# Patient Record
Sex: Female | Born: 1965 | Race: White | Hispanic: No | Marital: Married | Smoking: Never smoker
Health system: Southern US, Community
[De-identification: ages and names within clinical notes are randomized; demographics above are authoritative.]

## PROBLEM LIST (undated history)

## (undated) DIAGNOSIS — M1990 Unspecified osteoarthritis, unspecified site: Secondary | ICD-10-CM

## (undated) DIAGNOSIS — Z800 Family history of malignant neoplasm of digestive organs: Secondary | ICD-10-CM

## (undated) DIAGNOSIS — E079 Disorder of thyroid, unspecified: Secondary | ICD-10-CM

## (undated) DIAGNOSIS — C801 Malignant (primary) neoplasm, unspecified: Secondary | ICD-10-CM

## (undated) DIAGNOSIS — N816 Rectocele: Secondary | ICD-10-CM

## (undated) DIAGNOSIS — R519 Headache, unspecified: Secondary | ICD-10-CM

## (undated) DIAGNOSIS — B019 Varicella without complication: Secondary | ICD-10-CM

## (undated) DIAGNOSIS — Z85850 Personal history of malignant neoplasm of thyroid: Secondary | ICD-10-CM

## (undated) DIAGNOSIS — N390 Urinary tract infection, site not specified: Secondary | ICD-10-CM

## (undated) DIAGNOSIS — E7800 Pure hypercholesterolemia, unspecified: Secondary | ICD-10-CM

## (undated) DIAGNOSIS — Z808 Family history of malignant neoplasm of other organs or systems: Secondary | ICD-10-CM

## (undated) DIAGNOSIS — Z803 Family history of malignant neoplasm of breast: Secondary | ICD-10-CM

## (undated) DIAGNOSIS — R51 Headache: Secondary | ICD-10-CM

## (undated) HISTORY — DX: Family history of malignant neoplasm of breast: Z80.3

## (undated) HISTORY — DX: Pure hypercholesterolemia, unspecified: E78.00

## (undated) HISTORY — DX: Disorder of thyroid, unspecified: E07.9

## (undated) HISTORY — DX: Headache: R51

## (undated) HISTORY — DX: Headache, unspecified: R51.9

## (undated) HISTORY — DX: Urinary tract infection, site not specified: N39.0

## (undated) HISTORY — DX: Rectocele: N81.6

## (undated) HISTORY — DX: Family history of malignant neoplasm of digestive organs: Z80.0

## (undated) HISTORY — DX: Varicella without complication: B01.9

## (undated) HISTORY — DX: Unspecified osteoarthritis, unspecified site: M19.90

## (undated) HISTORY — PX: THYROIDECTOMY: SHX17

## (undated) HISTORY — DX: Personal history of malignant neoplasm of thyroid: Z85.850

## (undated) HISTORY — DX: Malignant (primary) neoplasm, unspecified: C80.1

## (undated) HISTORY — PX: TONSILECTOMY, ADENOIDECTOMY, BILATERAL MYRINGOTOMY AND TUBES: SHX2538

## (undated) HISTORY — DX: Family history of malignant neoplasm of other organs or systems: Z80.8

---

## 1998-01-01 ENCOUNTER — Other Ambulatory Visit: Admission: RE | Admit: 1998-01-01 | Discharge: 1998-01-01 | Payer: Self-pay | Admitting: *Deleted

## 1999-01-07 ENCOUNTER — Other Ambulatory Visit: Admission: RE | Admit: 1999-01-07 | Discharge: 1999-01-07 | Payer: Self-pay | Admitting: *Deleted

## 2001-07-17 ENCOUNTER — Ambulatory Visit (HOSPITAL_COMMUNITY): Admission: RE | Admit: 2001-07-17 | Discharge: 2001-07-17 | Payer: Self-pay | Admitting: Obstetrics and Gynecology

## 2001-07-17 ENCOUNTER — Encounter: Payer: Self-pay | Admitting: Obstetrics and Gynecology

## 2001-09-24 ENCOUNTER — Ambulatory Visit (HOSPITAL_COMMUNITY): Admission: RE | Admit: 2001-09-24 | Discharge: 2001-09-24 | Payer: Self-pay | Admitting: Obstetrics and Gynecology

## 2001-09-24 ENCOUNTER — Encounter: Payer: Self-pay | Admitting: Obstetrics and Gynecology

## 2001-11-13 ENCOUNTER — Ambulatory Visit (HOSPITAL_COMMUNITY): Admission: RE | Admit: 2001-11-13 | Discharge: 2001-11-13 | Payer: Self-pay | Admitting: Obstetrics and Gynecology

## 2001-11-13 ENCOUNTER — Encounter: Payer: Self-pay | Admitting: Obstetrics and Gynecology

## 2001-12-05 ENCOUNTER — Inpatient Hospital Stay (HOSPITAL_COMMUNITY): Admission: RE | Admit: 2001-12-05 | Discharge: 2001-12-06 | Payer: Self-pay | Admitting: Obstetrics and Gynecology

## 2002-04-12 ENCOUNTER — Ambulatory Visit (HOSPITAL_COMMUNITY): Admission: RE | Admit: 2002-04-12 | Discharge: 2002-04-12 | Payer: Self-pay | Admitting: Pulmonary Disease

## 2003-05-13 ENCOUNTER — Encounter (HOSPITAL_COMMUNITY): Admission: RE | Admit: 2003-05-13 | Discharge: 2003-06-12 | Payer: Self-pay | Admitting: Pulmonary Disease

## 2003-08-14 ENCOUNTER — Ambulatory Visit (HOSPITAL_COMMUNITY): Admission: RE | Admit: 2003-08-14 | Discharge: 2003-08-14 | Payer: Self-pay | Admitting: Pulmonary Disease

## 2003-12-31 ENCOUNTER — Ambulatory Visit (HOSPITAL_COMMUNITY): Admission: RE | Admit: 2003-12-31 | Discharge: 2003-12-31 | Payer: Self-pay | Admitting: Pulmonary Disease

## 2004-12-14 ENCOUNTER — Ambulatory Visit (HOSPITAL_COMMUNITY): Admission: RE | Admit: 2004-12-14 | Discharge: 2004-12-14 | Payer: Self-pay | Admitting: General Surgery

## 2005-10-13 ENCOUNTER — Ambulatory Visit (HOSPITAL_COMMUNITY): Admission: RE | Admit: 2005-10-13 | Discharge: 2005-10-13 | Payer: Self-pay | Admitting: General Surgery

## 2006-02-02 ENCOUNTER — Ambulatory Visit (HOSPITAL_COMMUNITY): Admission: RE | Admit: 2006-02-02 | Discharge: 2006-02-03 | Payer: Self-pay | Admitting: Surgery

## 2006-02-02 ENCOUNTER — Encounter (INDEPENDENT_AMBULATORY_CARE_PROVIDER_SITE_OTHER): Payer: Self-pay | Admitting: Specialist

## 2006-02-21 ENCOUNTER — Encounter (HOSPITAL_COMMUNITY): Admission: RE | Admit: 2006-02-21 | Discharge: 2006-03-23 | Payer: Self-pay | Admitting: Endocrinology

## 2006-02-24 ENCOUNTER — Observation Stay (HOSPITAL_COMMUNITY): Admission: AD | Admit: 2006-02-24 | Discharge: 2006-02-26 | Payer: Self-pay | Admitting: Pulmonary Disease

## 2007-03-19 ENCOUNTER — Encounter (HOSPITAL_COMMUNITY): Admission: RE | Admit: 2007-03-19 | Discharge: 2007-04-18 | Payer: Self-pay | Admitting: Endocrinology

## 2007-12-19 ENCOUNTER — Other Ambulatory Visit: Admission: RE | Admit: 2007-12-19 | Discharge: 2007-12-19 | Payer: Self-pay | Admitting: Obstetrics and Gynecology

## 2008-11-05 ENCOUNTER — Ambulatory Visit (HOSPITAL_COMMUNITY): Admission: RE | Admit: 2008-11-05 | Discharge: 2008-11-05 | Payer: Self-pay | Admitting: Obstetrics and Gynecology

## 2008-12-30 ENCOUNTER — Other Ambulatory Visit: Admission: RE | Admit: 2008-12-30 | Discharge: 2008-12-30 | Payer: Self-pay | Admitting: Obstetrics and Gynecology

## 2009-11-12 ENCOUNTER — Ambulatory Visit (HOSPITAL_COMMUNITY): Admission: RE | Admit: 2009-11-12 | Discharge: 2009-11-12 | Payer: Self-pay | Admitting: Obstetrics and Gynecology

## 2010-04-13 ENCOUNTER — Other Ambulatory Visit: Admission: RE | Admit: 2010-04-13 | Discharge: 2010-04-13 | Payer: Self-pay | Admitting: Obstetrics and Gynecology

## 2010-10-17 ENCOUNTER — Encounter: Payer: Self-pay | Admitting: Endocrinology

## 2010-11-29 ENCOUNTER — Other Ambulatory Visit: Payer: Self-pay | Admitting: Obstetrics and Gynecology

## 2010-11-29 DIAGNOSIS — Z139 Encounter for screening, unspecified: Secondary | ICD-10-CM

## 2010-12-10 ENCOUNTER — Ambulatory Visit (HOSPITAL_COMMUNITY)
Admission: RE | Admit: 2010-12-10 | Discharge: 2010-12-10 | Disposition: A | Discharge status: Home / Self Care | Payer: BC Managed Care – PPO | Source: Ambulatory Visit | Attending: Obstetrics and Gynecology | Admitting: Obstetrics and Gynecology

## 2010-12-10 DIAGNOSIS — Z1231 Encounter for screening mammogram for malignant neoplasm of breast: Secondary | ICD-10-CM | POA: Insufficient documentation

## 2010-12-10 DIAGNOSIS — Z139 Encounter for screening, unspecified: Secondary | ICD-10-CM

## 2011-02-11 NOTE — Op Note (Signed)
[facility]  Patient:    Gwendolyn Flores, Gwendolyn Flores Visit Number: [id number] MRN: [id number]          Service Type: OBS Location: 4A A419 01 Attending Physician:  Lazaro Arms Dictated by:   Christin Bach, M.D. Proc. Date: 12/05/01 Admit Date:  12/05/2001 Discharge Date: 12/06/2001                             Operative Report  DELIVERY NOTE  DELIVERY TIME:  11:30 a.m. on December 05, 2001.  LABOR SUMMARY AND DELIVERY NOTE:  The patient progressed nicely in labor with epidural catheter working effectively.  She had a Foley catheter for voiding difficulty placed at 9 a.m.  She progressed to 9 cm by 9:40.  She had the urge to push, but was considered complete at 10:45 a.m.  Pitocin was started at 11:10 when the contractions effectiveness was considered an obstacle in the patients progress in labor.  She progressed over the next 20 minutes to spontaneous vaginal delivery over an intact perineum of a healthy female infant. Apgars of 8 and 9.  Placenta was delivered easily without difficulty.  Postpartum she did well with normal blood loss, and an epidural catheter was removed, and tip visualized as intact. Dictated by:   Christin Bach, M.D. Attending Physician:  Lazaro Arms DD:  12/30/01 TD:  12/31/01 Job: [id number]

## 2011-02-11 NOTE — Op Note (Signed)
NAMETANICKA, Gwendolyn Flores               ACCOUNT NO.:  [id number]   MEDICAL RECORD NO.:  [id number]           PATIENT TYPE:   LOCATION:                                 FACILITY:   PHYSICIAN:  Velora Heckler, MD           DATE OF BIRTH:   DATE OF PROCEDURE:  02/02/2006  DATE OF DISCHARGE:                                 OPERATIVE REPORT   PREOP DIAGNOSIS:  1.  Right thyroid nodule.  2.  Thyroid goiter.  3.  Hyperthyroidism.   POSTOP DIAGNOSIS:  1.  Right thyroid nodule.  2.  Thyroid goiter.  3.  Hyperthyroidism.   PROCEDURE:  Total thyroidectomy.   SURGEON:  Velora Heckler, MD, FACS   ASSISTANT:  Leonie Man, M.D.   ANESTHESIA:  General per Jenelle Mages. Rica Mast, M.D.   PREPARATION:  Betadine.   BLOOD LOSS:  Minimal.   COMPLICATIONS:  None.   INDICATIONS:  The patient is a 45 year old white female from [place],  [place].  She has been followed by Dr. Casimiro Needle Altheimer.  She was  initially evaluated, in my practice, October 2004.  She has been treated  with Tapazole for thyroid suppression.  Sequential ultrasounds, however,  demonstrate enlargement of the right thyroid nodule.  Previous needle biopsy  showed this to be a follicular lesion without sign of malignancy.  The  patient now comes to surgery for thyroidectomy for thyroid goiter, thyroid  nodule, and hyperthyroidism.   BODY OF REPORT:  Procedure is done in OR #6 at the [facility].  The patient is brought to the operating room, and placed in the  supine position on the operating room table.  Following administration of  general anesthesia the patient is prepped and draped in usual strict aseptic  fashion.  After ascertaining that an adequate level of anesthesia had been  obtained, a Kocher incision is made with a #15 blade.  Dissection is carried  through the skin, subcutaneous tissues, and platysma.  Hemostasis is  obtained with the electrocautery.  Skin flaps were developed cephalad  and  caudad from the thyroid notch to the sternal notch.  A Mahorner self-  retaining retractor is placed for exposure.  Strap muscles were incised in  the midline.  Strap muscles are reflected initially to the left.  Left lobe  on palpation has some subtle nodularity.  Overall it was slightly enlarged.  There are no dominant masses on the left.   We turned our attention to the right lobe.  The right lobe is markedly  enlarged with a dominant central nodule.  The strap muscles are reflected  laterally.  Venous tributaries are divided between small-and-medium  Ligaclips.  Superior pole was dissected out.  Superior pole vessels are  dissected out and ligated in continuity with 2-0 silk ties and medium  Ligaclips and divided.  Gland is rolled medially.  Inferior venous  tributaries are divided between medium Ligaclips; and larger vessels are  ligated in continuity with 2-0 silk ties and divided.  Superior parathyroid  gland is identified.  It is dissected out on its vascular pedicle and  preserved.  Recurrent laryngeal nerve was identified and preserved.  Inferior parathyroid gland on the right is also identified and preserved.  Branches of the inferior thyroid artery are divided between small Ligaclips.  Ligament of Allyson Sabal is transected with the electrocautery; and the gland is  rolled anteriorly up and onto the anterior surface of the trachea.  There is  a moderate size pyramidal lobe which is carefully dissected out using the  electrocautery for hemostasis.   Next, we turned our attention back to the left thyroid lobe.  Again strap  muscles are reflected laterally.  Middle thyroid vein is divided between  small Ligaclips.  Superior pole is dissected out.  Superior pole vessels are  ligated in continuity with 2-0 silk ties and medium ; Ligaclips and divided.  Gland is rolled anteriorly.  Venous tributaries are divided between small-  and-medium Ligaclips.  Inferior venous tributaries are  ligated in continuity  with 2-0 silk ties and divided.  Branches of the inferior thyroid artery are  divided between small Ligaclips.  Superior parathyroid gland is identified  on the left and preserved.  Recurrent nerve was identified and preserved.  Ligament of Allyson Sabal is transected with the electrocautery; and the gland is  rolled up and onto the anterior trachea from which it is excised completely  with the electrocautery.  Sutures used to mark the right superior pole.  The  entire gland is submitted to pathology for review.  Palpation in the neck  shows no other significant adenopathy.  There are no worrisome findings.   Neck is irrigated with warm saline.  Good hemostasis is achieved.  Surgicel  is placed over the area of the recurrent nerves and parathyroid glands  bilaterally.  Strap muscles are reapproximated, in the midline, with  interrupted 3-0 Vicryl sutures.  Platysma is closed with interrupted 3-0  Vicryl sutures.  Skin is closed with a running 4-0 Vicryl subcuticular  suture.  Wound is washed and dried; and Benzoin and Steri-Strips are  applied.  Sterile dressings are applied.  The patient is awakened from  anesthesia and brought to the recovery room in stable condition.      Velora Heckler, MD  Electronically Signed     TMG/MEDQ  D:  02/02/2006  T:  02/03/2006  Job:  [id number]   cc:   Veverly Fells. Altheimer, M.D.  Fax: [phone]   Oneal Deputy. Juanetta Gosling, M.D.  Fax: [phone]   Dalia Heading, M.D.  Fax: [phone]

## 2011-02-11 NOTE — Discharge Summary (Signed)
[facility]  Patient:    Gwendolyn Flores, Gwendolyn Flores Visit Number: [id number] MRN: [id number]          Service Type: OBS Location: 4A A419 01 Attending Physician:  Lazaro Arms Dictated by:   Turner Daniels, M.D. Admit Date:  12/05/2001 Discharge Date: 12/06/2001                             Discharge Summary  NO DICTATION Dictated by:   Turner Daniels, M.D. Attending Physician:  Lazaro Arms DD:  12/06/01 TD:  12/07/01 Job: [id number]

## 2011-02-11 NOTE — Op Note (Signed)
[facility]  Patient:    SARAIH, LORTON Visit Number: [id number] MRN: [id number]          Service Type: OBS Location: 4A A419 01 Attending Physician:  Lazaro Arms Dictated by:   Christin Bach, M.D. Proc. Date: 12/05/01 Admit Date:  12/05/2001 Discharge Date: 12/06/2001                             Operative Report  PROCEDURE:  Epidural catheter.  PROCEDURE NOTE:  The patient was given a fluid bolus and prepped for epidural catheter which was placed at L3-4 interspace using loss-of-resistance technique on the first attempt, with effective analgesia to a T6 level.  Blood pressures remained in the 100 to 110 range with pulses in the 80s to 90s.  The catheter was fed a distance of 3 cm into the epidural space and then taped to the bag after removal of the needle, with a bolus infusion of a 7-cc initial dose of 0.125% Marcaine followed by 12 cc per hour.  The initial test dose was 1.5% lidocaine with epinephrine.  The patient had good analgesic relief of pain. Dictated by:   Christin Bach, M.D. Attending Physician:  Lazaro Arms DD:  12/30/01 TD:  12/31/01 Job: [id number]

## 2011-02-11 NOTE — Group Therapy Note (Signed)
NAME:  Gwendolyn Flores, Gwendolyn Flores               ACCOUNT NO.:  [id number]   MEDICAL RECORD NO.:  [id number]          PATIENT TYPE:  OBV   LOCATION:  A208                          FACILITY:  [facility]   PHYSICIAN:  Edward L. Juanetta Gosling, M.D.DATE OF BIRTH:  1966-04-22   DATE OF PROCEDURE:  DATE OF DISCHARGE:                                   PROGRESS NOTE   HISTORY OF PRESENT ILLNESS:  Gwendolyn Flores has had a radioactive iodine.  Says  she is feeling okay.  She had a little bit of nausea last night.   PHYSICAL EXAMINATION:  VITAL SIGNS:  Exam shows temperature 98.2, pulse 71,  respirations 20, blood pressure 97/71.  Her incision is well-healed.   ASSESSMENT:  She seems to be doing okay.   PLAN:  Plan is for discharge per the protocol.      Edward L. Juanetta Gosling, M.D.  Electronically Signed     ELH/MEDQ  D:  02/25/2006  T:  02/25/2006  Job:  [id number]

## 2011-02-11 NOTE — H&P (Signed)
[facility]  Patient:    Gwendolyn Flores, Gwendolyn Flores Visit Number: [id number] MRN: [id number]          Service Type: OBS Location: 4A A419 01 Attending Physician:  Lazaro Arms Dictated by:   Christin Bach, M.D. Admit Date:  12/05/2001 Discharge Date: 12/06/2001   CC:         Vivia Ewing, D.O.   History and Physical  ADMISSION DIAGNOSES: 1. Pregnancy 37-1/[redacted] weeks gestation. 2. Active phase labor.  HISTORY OF PRESENT ILLNESS:  This 45 year old female, gravida 2, para 1, LMP ______ 68, 2002 placing menstrual EDC March 17 with ultrasound assigned EDC corresponding well is admitted at 39+ weeks gestation after a pregnancy course followed through our office since August 2002 with pregnancy care notable for ultrasounds of the infant showing bilateral hydronephrosis of a mild nature with normal sized bladder, normal renal cortex around a mildly dilated collecting system without evidence of hydroureter.  The patient was then followed with ultrasounds for fluid volume and there has been no evidence of oligohydramnios or progression of the hydronephrosis.  The patient presents on the morning of December 05, 2001 complaining of regular uterine contractions beginning in the early morning hours, approximately 8 a.m. with regular uterine contractions tolerated well; and cervical dilation to 6 cm dilated, 0 station, 90% effaced when initially evaluated.  The patient was admitted with contractions since 3 oclock but leaking fluid beginning just before arrival to the hospital.  PAST MEDICAL HISTORY:  Benign.  PAST SURGICAL HISTORY:  Tonsillectomy.  ALLERGIES:  CODEINE AND SULFA.  MEDICATIONS:  Zyrtec and ______.  PRIOR OBSTETRICAL HISTORY:  Notable for a 13 hour labor with vaginal delivery under epidural analgesia in 2001.  PHYSICAL EXAMINATION:  VITAL SIGNS:  Height:  5 feet 6 inches, weight 168 which is a 30 pound weight gain.  ABDOMEN:  Fetus is a term size,  in vertex presentation with good fetal heart monitoring pattern.  PLAN:  Good prognosis for vaginal delivery. Dictated by:   Christin Bach, M.D. Attending Physician:  Lazaro Arms DD:  12/30/01 TD:  12/31/01 Job: [id number]

## 2011-02-11 NOTE — H&P (Signed)
NAME:  Gwendolyn Flores, Gwendolyn Flores               ACCOUNT NO.:  [id number]   MEDICAL RECORD NO.:  [id number]           PATIENT TYPE:   LOCATION:                                FACILITY:  [facility]   PHYSICIAN:  Edward L. Juanetta Gosling, M.D.DATE OF BIRTH:  [birth year]   DATE OF ADMISSION:  DATE OF DISCHARGE:  LH                                HISTORY & PHYSICAL   REASON FOR ADMISSION:  Radioactive iodine.   HISTORY:  Ms. Boulier is a 45 year old who has had what appeared to be  thyroiditis and a thyroid nodule for some time.  She eventually started  having more problems with it and underwent a total thyroidectomy, Feb 02, 2006, and surprisingly was found to have a thyroid cancer.  She is  undergoing radioactive iodine for that.  In n the past she has been treated  with Tapazole for thyroid suppression.  She has had a needle biopsy that  showed it was a follicular lesion without malignancy.  Her past medical  history otherwise is pretty much negative.  She has had a recent episode of  cystitis and has been treated with Cipro.   SOCIAL HISTORY:  She does not smoke.  She is married, lives at home, works  as a Engineer, civil (consulting).   FAMILY HISTORY:  Positive for a postoperative pulmonary embolus in her  father and diabetes in a brother.   PHYSICAL EXAM:  NECK:  Physical exam shows a previous thyroidectomy scar.  CHEST:  Clear.  HEART:  Regular.  ABDOMEN:  Soft.   ASSESSMENT:  She has thyroid malignancy.   PLAN:  She is going to be admitted for radioactive iodine.      Edward L. Juanetta Gosling, M.D.  Electronically Signed     ELH/MEDQ  D:  02/24/2006  T:  02/24/2006  Job:  [id number]

## 2011-04-19 ENCOUNTER — Other Ambulatory Visit: Payer: Self-pay | Admitting: Adult Health

## 2011-04-19 ENCOUNTER — Other Ambulatory Visit (HOSPITAL_COMMUNITY)
Admission: RE | Admit: 2011-04-19 | Discharge: 2011-04-19 | Disposition: A | Discharge status: Home / Self Care | Payer: BC Managed Care – PPO | Source: Ambulatory Visit | Attending: Obstetrics and Gynecology | Admitting: Obstetrics and Gynecology

## 2011-04-19 DIAGNOSIS — Z01419 Encounter for gynecological examination (general) (routine) without abnormal findings: Secondary | ICD-10-CM | POA: Insufficient documentation

## 2012-01-02 ENCOUNTER — Other Ambulatory Visit: Payer: Self-pay | Admitting: Obstetrics and Gynecology

## 2012-01-02 DIAGNOSIS — Z139 Encounter for screening, unspecified: Secondary | ICD-10-CM

## 2012-01-09 ENCOUNTER — Ambulatory Visit (HOSPITAL_COMMUNITY)
Admission: RE | Admit: 2012-01-09 | Discharge: 2012-01-09 | Disposition: A | Discharge status: Home / Self Care | Payer: BC Managed Care – PPO | Source: Ambulatory Visit | Attending: Obstetrics and Gynecology | Admitting: Obstetrics and Gynecology

## 2012-01-09 DIAGNOSIS — Z1231 Encounter for screening mammogram for malignant neoplasm of breast: Secondary | ICD-10-CM | POA: Insufficient documentation

## 2012-01-09 DIAGNOSIS — R928 Other abnormal and inconclusive findings on diagnostic imaging of breast: Secondary | ICD-10-CM | POA: Insufficient documentation

## 2012-01-09 DIAGNOSIS — Z139 Encounter for screening, unspecified: Secondary | ICD-10-CM

## 2012-01-11 ENCOUNTER — Other Ambulatory Visit: Payer: Self-pay | Admitting: Obstetrics and Gynecology

## 2012-01-11 DIAGNOSIS — R928 Other abnormal and inconclusive findings on diagnostic imaging of breast: Secondary | ICD-10-CM

## 2012-01-13 ENCOUNTER — Ambulatory Visit
Admission: RE | Admit: 2012-01-13 | Discharge: 2012-01-13 | Disposition: A | Discharge status: Home / Self Care | Payer: BC Managed Care – PPO | Source: Ambulatory Visit | Attending: Obstetrics and Gynecology | Admitting: Obstetrics and Gynecology

## 2012-01-13 DIAGNOSIS — R928 Other abnormal and inconclusive findings on diagnostic imaging of breast: Secondary | ICD-10-CM

## 2012-06-01 ENCOUNTER — Other Ambulatory Visit (HOSPITAL_COMMUNITY)
Admission: RE | Admit: 2012-06-01 | Discharge: 2012-06-01 | Disposition: A | Discharge status: Home / Self Care | Payer: BC Managed Care – PPO | Source: Ambulatory Visit | Attending: Obstetrics and Gynecology | Admitting: Obstetrics and Gynecology

## 2012-06-01 DIAGNOSIS — Z01419 Encounter for gynecological examination (general) (routine) without abnormal findings: Secondary | ICD-10-CM | POA: Insufficient documentation

## 2012-06-01 DIAGNOSIS — Z1151 Encounter for screening for human papillomavirus (HPV): Secondary | ICD-10-CM | POA: Insufficient documentation

## 2012-06-04 ENCOUNTER — Other Ambulatory Visit: Payer: Self-pay | Admitting: Obstetrics & Gynecology

## 2013-03-25 ENCOUNTER — Other Ambulatory Visit: Payer: Self-pay | Admitting: Obstetrics and Gynecology

## 2013-03-25 DIAGNOSIS — Z139 Encounter for screening, unspecified: Secondary | ICD-10-CM

## 2013-04-01 ENCOUNTER — Ambulatory Visit (HOSPITAL_COMMUNITY)
Admission: RE | Admit: 2013-04-01 | Discharge: 2013-04-01 | Disposition: A | Discharge status: Home / Self Care | Payer: BC Managed Care – PPO | Source: Ambulatory Visit | Attending: Obstetrics and Gynecology | Admitting: Obstetrics and Gynecology

## 2013-04-01 DIAGNOSIS — Z1231 Encounter for screening mammogram for malignant neoplasm of breast: Secondary | ICD-10-CM | POA: Insufficient documentation

## 2013-04-01 DIAGNOSIS — Z139 Encounter for screening, unspecified: Secondary | ICD-10-CM

## 2013-06-28 ENCOUNTER — Ambulatory Visit (INDEPENDENT_AMBULATORY_CARE_PROVIDER_SITE_OTHER): Payer: BC Managed Care – PPO | Admitting: Adult Health

## 2013-06-28 ENCOUNTER — Encounter: Payer: Self-pay | Admitting: Adult Health

## 2013-06-28 VITALS — BP 108/78 | HR 70 | Ht 65.25 in | Wt 139.0 lb

## 2013-06-28 DIAGNOSIS — Z85850 Personal history of malignant neoplasm of thyroid: Secondary | ICD-10-CM

## 2013-06-28 DIAGNOSIS — Z01419 Encounter for gynecological examination (general) (routine) without abnormal findings: Secondary | ICD-10-CM

## 2013-06-28 DIAGNOSIS — Z1212 Encounter for screening for malignant neoplasm of rectum: Secondary | ICD-10-CM

## 2013-06-28 DIAGNOSIS — N816 Rectocele: Secondary | ICD-10-CM

## 2013-06-28 HISTORY — DX: Rectocele: N81.6

## 2013-06-28 HISTORY — DX: Personal history of malignant neoplasm of thyroid: Z85.850

## 2013-06-28 LAB — HEMOCCULT GUIAC POC 1CARD (OFFICE): Fecal Occult Blood, POC: NEGATIVE

## 2013-06-28 NOTE — Progress Notes (Signed)
Patient ID: Gwendolyn Flores, female   DOB: [birth year], 47 y.o.   MRN: [id number] History of Present Illness: Gwendolyn Flores is a 47 year old white female married in for physical. Had normal pap with negative HPV 2013.  Current Medications, Allergies, Past Medical History, Past Surgical History, Family History and Social History were reviewed in [employer] record.     Review of Systems: Patient denies any headaches, blurred vision, shortness of breath, chest pain, abdominal pain, problems with bowel movements, urination, or intercourse. No joint pain or mood changes.Has had some vertigo saw ENT ?migraine induced vertigo, had seen Dr Juanetta Gosling first,has some IBS-C.    Physical Exam:BP 108/78  Pulse 70  Wt 139 lb (63.05 kg)  LMP 06/24/2013 General:  Well developed, well nourished, no acute distress Skin:  Warm and dry Neck:  Midline trachea, sp thyroidectomy(thyroid cancer) Lungs; Clear to auscultation bilaterally Breast:  No dominant palpable mass, retraction, or nipple discharge,has regular irregularities  Cardiovascular: Regular rate and rhythm Abdomen:  Soft, non tender, no hepatosplenomegaly Pelvic:  External genitalia is normal in appearance.  The vagina is normal in appearance.  The cervix is bulbous.  Uterus is felt to be normal size, shape, and contour.  No adnexal masses or tenderness noted. Rectal: Good sphincter tone, no polyps, or hemorrhoids felt.  Hemoccult negative.Has rectocele. Extremities:  No swelling or varicosities noted Psych:  No mood changes,alert and cooperative, seems happy   Impression: Yearly gyn exam no pap History thyroid cancer Rectocele     Plan: Physical in 1 year Mammogram yearly Colonoscopy at 52 Call prn problems

## 2013-06-28 NOTE — Patient Instructions (Addendum)
Physical in 1 year Mammogram yearly  colonoscopy at 78

## 2013-08-01 ENCOUNTER — Other Ambulatory Visit: Payer: Self-pay

## 2014-03-24 ENCOUNTER — Telehealth: Payer: Self-pay | Admitting: Adult Health

## 2014-03-24 ENCOUNTER — Other Ambulatory Visit: Payer: Self-pay | Admitting: Obstetrics and Gynecology

## 2014-03-24 NOTE — Telephone Encounter (Signed)
Left message x 1. JSY 

## 2014-03-25 NOTE — Telephone Encounter (Signed)
Spoke with pt. Pt didn't need [place] to schedule screening mammo. Pt will schedule her appt, and follow up as needed. [place]

## 2014-04-07 ENCOUNTER — Other Ambulatory Visit: Payer: Self-pay | Admitting: Obstetrics and Gynecology

## 2014-04-07 DIAGNOSIS — Z139 Encounter for screening, unspecified: Secondary | ICD-10-CM

## 2014-04-11 ENCOUNTER — Ambulatory Visit (HOSPITAL_COMMUNITY): Payer: BC Managed Care – PPO

## 2014-04-14 ENCOUNTER — Ambulatory Visit (HOSPITAL_COMMUNITY)
Admission: RE | Admit: 2014-04-14 | Discharge: 2014-04-14 | Disposition: A | Discharge status: Home / Self Care | Payer: BC Managed Care – PPO | Source: Ambulatory Visit | Attending: Obstetrics and Gynecology | Admitting: Obstetrics and Gynecology

## 2014-04-14 DIAGNOSIS — Z139 Encounter for screening, unspecified: Secondary | ICD-10-CM

## 2014-04-14 DIAGNOSIS — Z1231 Encounter for screening mammogram for malignant neoplasm of breast: Secondary | ICD-10-CM | POA: Insufficient documentation

## 2014-04-21 ENCOUNTER — Other Ambulatory Visit: Payer: Self-pay | Admitting: Obstetrics and Gynecology

## 2014-04-21 DIAGNOSIS — R928 Other abnormal and inconclusive findings on diagnostic imaging of breast: Secondary | ICD-10-CM

## 2014-04-25 ENCOUNTER — Ambulatory Visit
Admission: RE | Admit: 2014-04-25 | Discharge: 2014-04-25 | Disposition: A | Discharge status: Home / Self Care | Payer: BC Managed Care – PPO | Source: Ambulatory Visit | Attending: Obstetrics and Gynecology | Admitting: Obstetrics and Gynecology

## 2014-04-25 DIAGNOSIS — R928 Other abnormal and inconclusive findings on diagnostic imaging of breast: Secondary | ICD-10-CM

## 2014-07-28 ENCOUNTER — Encounter: Payer: Self-pay | Admitting: Adult Health

## 2014-10-07 ENCOUNTER — Encounter: Payer: Self-pay | Admitting: *Deleted

## 2014-10-08 ENCOUNTER — Ambulatory Visit (INDEPENDENT_AMBULATORY_CARE_PROVIDER_SITE_OTHER): Payer: BC Managed Care – PPO | Admitting: *Deleted

## 2014-10-08 DIAGNOSIS — I8393 Asymptomatic varicose veins of bilateral lower extremities: Secondary | ICD-10-CM

## 2014-10-08 NOTE — Progress Notes (Signed)
X=.3% Sotradecol administered with a 27g butterfly.  Patient received a total of 6cc.  Cutaneous Laser:pulsed mode  810j/cm2 400 ms delay  13 ms Duration 0.5 spot  Total pulses: 17971Total energy 2.845  Total time::23  Photos: Yes.    Compression stockings applied: Yes.     Treated larger vessels with sclero and small ones with CL. Tol well. Hope for good results. Follow prn. May want more CL in a few months.

## 2014-12-30 ENCOUNTER — Encounter: Payer: Self-pay | Admitting: *Deleted

## 2014-12-31 ENCOUNTER — Ambulatory Visit (INDEPENDENT_AMBULATORY_CARE_PROVIDER_SITE_OTHER): Payer: Self-pay | Admitting: *Deleted

## 2014-12-31 DIAGNOSIS — I8393 Asymptomatic varicose veins of bilateral lower extremities: Secondary | ICD-10-CM

## 2014-12-31 NOTE — Progress Notes (Signed)
   Cutaneous Laser:pulsed mode  810j/cm2 400 ms delay  13 ms Duration 0.5 spot  Total pulses: 2519 Total energy 3.988  Total time:0:32  Photos: No.  Compression stockings applied: No.   Treated hopefully all her tiny red vessels all over her legs. She is very pleased with the sclero from Jan. Tol well. Will follow prn.

## 2015-04-13 ENCOUNTER — Other Ambulatory Visit: Payer: Self-pay | Admitting: Obstetrics and Gynecology

## 2015-04-13 DIAGNOSIS — Z1231 Encounter for screening mammogram for malignant neoplasm of breast: Secondary | ICD-10-CM

## 2015-05-04 ENCOUNTER — Ambulatory Visit (HOSPITAL_COMMUNITY): Payer: Self-pay

## 2015-05-05 ENCOUNTER — Ambulatory Visit (HOSPITAL_COMMUNITY)
Admission: RE | Admit: 2015-05-05 | Discharge: 2015-05-05 | Disposition: A | Discharge status: Home / Self Care | Payer: BLUE CROSS/BLUE SHIELD | Source: Ambulatory Visit | Attending: Pulmonary Disease | Admitting: Pulmonary Disease

## 2015-05-05 ENCOUNTER — Other Ambulatory Visit (HOSPITAL_COMMUNITY): Payer: Self-pay | Admitting: Pulmonary Disease

## 2015-05-05 DIAGNOSIS — R509 Fever, unspecified: Secondary | ICD-10-CM

## 2015-05-05 DIAGNOSIS — R05 Cough: Secondary | ICD-10-CM

## 2015-05-05 DIAGNOSIS — R918 Other nonspecific abnormal finding of lung field: Secondary | ICD-10-CM | POA: Diagnosis not present

## 2015-05-05 DIAGNOSIS — R059 Cough, unspecified: Secondary | ICD-10-CM

## 2015-06-11 ENCOUNTER — Other Ambulatory Visit: Payer: Self-pay | Admitting: Pulmonary Disease

## 2015-06-11 DIAGNOSIS — J189 Pneumonia, unspecified organism: Secondary | ICD-10-CM

## 2015-06-12 ENCOUNTER — Other Ambulatory Visit: Payer: Self-pay | Admitting: Obstetrics and Gynecology

## 2015-06-12 DIAGNOSIS — Z1231 Encounter for screening mammogram for malignant neoplasm of breast: Secondary | ICD-10-CM

## 2015-06-15 ENCOUNTER — Ambulatory Visit (HOSPITAL_COMMUNITY)
Admission: RE | Admit: 2015-06-15 | Discharge: 2015-06-15 | Disposition: A | Discharge status: Home / Self Care | Payer: BLUE CROSS/BLUE SHIELD | Source: Ambulatory Visit

## 2015-06-15 ENCOUNTER — Other Ambulatory Visit: Payer: Self-pay | Admitting: Obstetrics and Gynecology

## 2015-06-15 ENCOUNTER — Ambulatory Visit (HOSPITAL_COMMUNITY)
Admission: RE | Admit: 2015-06-15 | Discharge: 2015-06-15 | Disposition: A | Discharge status: Home / Self Care | Payer: BLUE CROSS/BLUE SHIELD | Source: Ambulatory Visit | Attending: Pulmonary Disease | Admitting: Pulmonary Disease

## 2015-06-15 DIAGNOSIS — J189 Pneumonia, unspecified organism: Secondary | ICD-10-CM | POA: Insufficient documentation

## 2015-06-15 DIAGNOSIS — Z1231 Encounter for screening mammogram for malignant neoplasm of breast: Secondary | ICD-10-CM | POA: Insufficient documentation

## 2015-06-22 ENCOUNTER — Ambulatory Visit (HOSPITAL_COMMUNITY): Payer: BLUE CROSS/BLUE SHIELD

## 2015-09-11 ENCOUNTER — Encounter: Payer: Self-pay | Admitting: *Deleted

## 2015-09-14 ENCOUNTER — Encounter: Payer: Self-pay | Admitting: Neurology

## 2015-09-14 ENCOUNTER — Ambulatory Visit (INDEPENDENT_AMBULATORY_CARE_PROVIDER_SITE_OTHER): Payer: BLUE CROSS/BLUE SHIELD | Admitting: Neurology

## 2015-09-14 VITALS — BP 98/69 | HR 66 | Ht 65.25 in | Wt 139.6 lb

## 2015-09-14 DIAGNOSIS — R42 Dizziness and giddiness: Secondary | ICD-10-CM | POA: Diagnosis not present

## 2015-09-14 DIAGNOSIS — H9192 Unspecified hearing loss, left ear: Secondary | ICD-10-CM

## 2015-09-14 DIAGNOSIS — R51 Headache: Secondary | ICD-10-CM

## 2015-09-14 DIAGNOSIS — G43109 Migraine with aura, not intractable, without status migrainosus: Secondary | ICD-10-CM

## 2015-09-14 DIAGNOSIS — G43809 Other migraine, not intractable, without status migrainosus: Secondary | ICD-10-CM | POA: Insufficient documentation

## 2015-09-14 DIAGNOSIS — R519 Headache, unspecified: Secondary | ICD-10-CM

## 2015-09-14 MED ORDER — TOPIRAMATE ER 25 MG PO CAP24: 25.0000 mg | ORAL_CAPSULE | Freq: Every day | ORAL | Status: DC

## 2015-09-14 MED ORDER — SUMATRIPTAN-NAPROXEN SODIUM 85-500 MG PO TABS: 1.0000 | ORAL_TABLET | Freq: Once | ORAL | Status: DC

## 2015-09-14 NOTE — Patient Instructions (Addendum)
Overall you are doing fairly well but I do want to suggest a few things today:   Remember to drink plenty of fluid, eat healthy meals and do not skip any meals. Try to eat protein with a every meal and eat a healthy snack such as fruit or nuts in between meals. Try to keep a regular sleep-wake schedule and try to exercise daily, particularly in the form of walking, 20-30 minutes a day, if you can.   As far as your medications are concerned, I would like to suggest: none at this time  As far as diagnostic testing: MRi of the brain  As far as your medications are concerned, I would like to suggest: 1. Daily preventative medications a. Riboflavin 400 mg/day (Vitamin B2) (every day) b. Magnesium 400 mg (trigmagnesium dicitrate) daily (every day)  Acute treatment: Treximet at onset of headache. May repeat once in 2 hours.   Will give you Trokendi XR, please hold onto it in case we need to start it  Our phone number is [phone]. We also have an after hours call service for urgent matters and there is a physician on-call for urgent questions. For any emergencies you know to call 911 or go to the nearest emergency room

## 2015-09-14 NOTE — Progress Notes (Signed)
[facility]    Provider:  Dr Jaynee Eagles Referring Provider: Sinda Du, MD Primary Care Physician:  Alonza Bogus, MD  CC:  Headaches  HPI:  Gwendolyn Flores is a 49 y.o. female here as a referral from Dr. Luan Pulling for headaches. 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 Headaches started in 2010 with nausea and dizziness. Lasted for over a day. Didn't have any again until 2013 but now getting more frequent and more severe. The dizziness occurs with headaches. The headaches are behind the eyes and in the temples and someimtes in the top, just pain unsure how to describe it, she gets nausea, no vomiting, no light sensitivity, no sound sensitivity. She takes excedrin migraine. She takes it sporadically some weeks 3-4x others none. At least 2x a week it gets bad enough to take excedrin. Last month had 15 headaches a month, this month only one. Off balance like on a boat. She has had room spinning occ when laying down but more off balance. No other associated symptoms. No inciting events. No focal neurologic deficits.  She provides a headache diary today with details that the dizziness started back in June 2015 but the headaches did not start until August 2016. When the headaches began, the episodes of headache with dizziness increased with the worst month being  Reviewed notes, labs and imaging from outside physicians, which showed;  Reviewed log that patient brought in. Patient is tried Antivert or Valium 2 mg every 6-8 hours for her dizziness. Effexor XR was not effective. First spell was in December 2010 when she was traveling to [facility]. Stopped at [employer] that were set night in the next morning. Had nausea. No vomiting. Lasted over a week. Had a small spell early in 2013. Second big spell June 2013 at the beach. Got worse that day and the next.  Had nausea lasted 3-4 days. The next spell was 08/29/2012 at work. Worse at night in the next morning. She had an upset stomach. Lasted a day.  February 2014: Dizziness for 2 days worse with laying down March 2013: 1 day of dizziness, upon waking April 2014 slight dizziness on laying down April 2015 slight dizziness one day. Better with caffeine. 02/2014 dizziness for 2 days October 2015: 5 days of dizzy spells and nausea. Onset of headaches. June 2016: 1 episode of dizzy spell and nausea July 2016: She woke up with dizziness that lasted 3 weeks. August 2016: 9 days of headache and dizziness September 2016 3 days of headache and dizziness October 2016: 1 episode of dizziness November 2016: 13 headache days with dizziness. December 2016: 1 episode of headache  Reviewed records from [facility]. Stated she is having headache and dizziness with increasing frequency. No change in her hearing although she does have occasional clicking in her left ear.  Review of Systems: Patient complains of symptoms per HPI as well as the following symptoms: Eye pain, hearing changes in left ear, headache, dizziness, anxiety. Pertinent negatives per HPI. All others negative.   Social History   Social History  . Marital Status: Married    Spouse Name: Elta Guadeloupe   . Number of Children: 2  . Years of Education: 16   Occupational History  . RN    Social History Main Topics  . Smoking status: Never Smoker   . Smokeless tobacco: Never Used  . Alcohol Use: No     Comment: wine socially  .  Drug Use: No  . Sexual Activity: Yes    Birth Control/ Protection: Other-see comments     Comment: vasectomy   Other Topics Concern  . Not on file   Social History Narrative   Lives at home with husband and children.   Caffeine use: 1-2 cups per day    Family History  Problem Relation Age of Onset  . Heart disease Father     heart attack  . Diabetes Brother   . Migraines Neg Hx     Past Medical History    Diagnosis Date  . Cancer [facility])     thyroid cancer  . Thyroid disease     cancer  . History of thyroid cancer 06/28/2013  . Rectocele 06/28/2013  . Headache     Past Surgical History  Procedure Laterality Date  . Tonsilectomy, adenoidectomy, bilateral myringotomy and tubes    . Thyroidectomy      Current Outpatient Prescriptions  Medication Sig Dispense Refill  . Ascorbic Acid (VITAMIN C PO) Take by mouth.    . Ascorbic Acid (VITAMIN C) 1000 MG tablet Take 1,000 mg by mouth daily.    Marland Kitchen aspirin-acetaminophen-caffeine (EXCEDRIN MIGRAINE) 250-250-65 MG tablet Take 1 tablet by mouth every 6 (six) hours as needed for headache.    Marland Kitchen BIOTIN PO Take by mouth.    . calcium carbonate 200 MG capsule Take 250 mg by mouth 2 (two) times daily with a meal.    . NASONEX 50 MCG/ACT nasal spray   11  . Probiotic Product (ALIGN PO) Take by mouth.    . SYNTHROID 125 MCG tablet Take 125 mcg by mouth daily before breakfast.     . SUMAtriptan-naproxen (TREXIMET) 85-500 MG tablet Take 1 tablet by mouth once. 10 tablet 0  . Topiramate ER (TROKENDI XR) 25 MG CP24 Take 25 mg by mouth at bedtime. 30 capsule 11   No current facility-administered medications for this visit.    Allergies as of 09/14/2015 - Review Complete 09/14/2015  Allergen Reaction Noted  . Codeine Nausea And Vomiting 06/28/2013  . Sulfa antibiotics Nausea And Vomiting 06/28/2013    Vitals: BP 98/69 mmHg  Pulse 66  Ht 5' 5.25" (1.657 m)  Wt 139 lb 9.6 oz (63.322 kg)  BMI 23.06 kg/m2 Last Weight:  Wt Readings from Last 1 Encounters:  09/14/15 139 lb 9.6 oz (63.322 kg)   Last Height:   Ht Readings from Last 1 Encounters:  09/14/15 5' 5.25" (1.657 m)   Physical exam: Exam: Gen: NAD, conversant, well nourised, well groomed                     CV: RRR, no MRG. No Carotid Bruits. No peripheral edema, warm, nontender Eyes: Conjunctivae clear without exudates or hemorrhage  Neuro: Detailed Neurologic Exam  Speech:    Speech  is normal; fluent and spontaneous with normal comprehension.  Cognition:    The patient is oriented to person, place, and time;     recent and remote memory intact;     language fluent;     normal attention, concentration,     fund of knowledge Cranial Nerves:    The pupils are equal, round, and reactive to light. The fundi are normal and spontaneous venous pulsations are present. Visual fields are full to finger confrontation. Extraocular movements are intact. Trigeminal sensation is intact and the muscles of mastication are normal. The face is symmetric. The palate elevates in the midline. Hearing intact. Voice is normal.  Shoulder shrug is normal. The tongue has normal motion without fasciculations.   Coordination:    Normal finger to nose and heel to shin. Normal rapid alternating movements.   Gait:    Heel-toe and tandem gait are normal.   Motor Observation:    No asymmetry, no atrophy, and no involuntary movements noted. Tone:    Normal muscle tone.    Posture:    Posture is normal. normal erect    Strength:    Strength is V/V in the upper and lower limbs.      Sensation: intact to LT     Reflex Exam:  DTR's:    Deep tendon reflexes in the upper and lower extremities are normal bilaterally.   Toes:    The toes are downgoing bilaterally.   Clonus:    Clonus is absent.       Assessment/Plan:  49 year old female with worsening headaches and dizziness. Need to rule out central cause of vertigo,dizziness such as vestibular schwannoma, cerebellar infarct and for pathologies affecting the brainstem or vestibular nerve, Can consider vestibular migraine however that is a diagnosis of exclusion.   - Discussion with patient regarding migraine triggers and behavioral modifications that can help prevent migraines. Provided information sheet with common migraine triggers including foods to avoid. She would like to try avoiding daily preventative medications at this time. If  headaches worsen or do not improve, can start Trokendi 25 mg at night.  As far as diagnostic testing: MRi of the brain  As far as your medications are concerned, I would like to suggest: 1. Daily preventative medications a. Riboflavin 400 mg/day (Vitamin B2) (every day) b. Magnesium 400 mg (trigmagnesium dicitrate) daily (every day)  Acute treatment: Treximet at onset of headache. May repeat once in 2 hours.     To prevent or relieve headaches, try the following: Cool Compress. Lie down and place a cool compress on your head.  Avoid headache triggers. If certain foods or odors seem to have triggered your migraines in the past, avoid them. A headache diary might help you identify triggers.  Include physical activity in your daily routine. Try a daily walk or other moderate aerobic exercise.  Manage stress. Find healthy ways to cope with the stressors, such as delegating tasks on your to-do list.  Practice relaxation techniques. Try deep breathing, yoga, massage and visualization.  Eat regularly. Eating regularly scheduled meals and maintaining a healthy diet might help prevent headaches. Also, drink plenty of fluids.  Follow a regular sleep schedule. Sleep deprivation might contribute to headaches Consider biofeedback. With this mind-body technique, you learn to control certain bodily functions - such as muscle tension, heart rate and blood pressure - to prevent headaches or reduce headache pain.    Proceed to emergency room if you experience new or worsening symptoms or symptoms do not resolve, if you have new neurologic symptoms or if headache is severe, or for any concerning symptom.     Will request labs from dr Legrand Como altheimer endocrinologist  Sarina Ill, MD  [facility] [address] [place], [place] [address]  Phone [phone] Fax [phone]

## 2015-10-05 ENCOUNTER — Other Ambulatory Visit (HOSPITAL_COMMUNITY)
Admission: RE | Admit: 2015-10-05 | Discharge: 2015-10-05 | Disposition: A | Discharge status: Home / Self Care | Payer: BLUE CROSS/BLUE SHIELD | Source: Ambulatory Visit | Attending: Adult Health | Admitting: Adult Health

## 2015-10-05 ENCOUNTER — Encounter: Payer: Self-pay | Admitting: Adult Health

## 2015-10-05 ENCOUNTER — Ambulatory Visit (INDEPENDENT_AMBULATORY_CARE_PROVIDER_SITE_OTHER): Payer: BLUE CROSS/BLUE SHIELD | Admitting: Adult Health

## 2015-10-05 VITALS — BP 110/80 | HR 78 | Ht 66.0 in | Wt 139.0 lb

## 2015-10-05 DIAGNOSIS — Z01419 Encounter for gynecological examination (general) (routine) without abnormal findings: Secondary | ICD-10-CM | POA: Insufficient documentation

## 2015-10-05 DIAGNOSIS — Z85850 Personal history of malignant neoplasm of thyroid: Secondary | ICD-10-CM

## 2015-10-05 DIAGNOSIS — Z1212 Encounter for screening for malignant neoplasm of rectum: Secondary | ICD-10-CM

## 2015-10-05 DIAGNOSIS — Z1151 Encounter for screening for human papillomavirus (HPV): Secondary | ICD-10-CM | POA: Diagnosis not present

## 2015-10-05 DIAGNOSIS — N816 Rectocele: Secondary | ICD-10-CM

## 2015-10-05 LAB — HEMOCCULT GUIAC POC 1CARD (OFFICE): Fecal Occult Blood, POC: NEGATIVE

## 2015-10-05 NOTE — Patient Instructions (Signed)
Physical in 1 year, pap in 3 years if normal Mammogram yearly Colonoscopy at 5

## 2015-10-05 NOTE — Progress Notes (Signed)
Patient ID: Gwendolyn Flores, female   DOB: 1966-03-05, 50 y.o.   MRN: [id number] History of Present Illness: Gwendolyn Flores is a 50 year old white female, married, G2P2 in for a well woman gyn exam and pap. She got flu shot.  Current Medications, Allergies, Past Medical History, Past Surgical History, Family History and Social History were reviewed in [employer] record.     Review of Systems: Patient denies any daily headaches, hearing loss, fatigue, blurred vision, shortness of breath, chest pain, abdominal pain, problems with bowel movements, urination, or intercourse. No joint pain or mood swings.    Physical Exam:BP 110/80 mmHg  Pulse 78  Ht 5\' 6"  (1.676 m)  Wt 139 lb (63.05 kg)  BMI 22.45 kg/m2  LMP 09/03/2015 General:  Well developed, well nourished, no acute distress Skin:  Warm and dry Neck:  Midline trachea, thyroid surgically absent, good ROM, no lymphadenopathy Lungs; Clear to auscultation bilaterally Breast:  No dominant palpable mass, retraction, or nipple discharge Cardiovascular: Regular rate and rhythm Abdomen:  Soft, non tender, no hepatosplenomegaly Pelvic:  External genitalia is normal in appearance, no lesions.  The vagina is normal in appearance. Urethra has no lesions or masses. The cervix is bulbous.Pap with HPV performed.  Uterus is felt to be normal size, shape, and contour.  No adnexal masses or tenderness noted.Bladder is non tender, no masses felt. Rectal: Good sphincter tone, no polyps, or hemorrhoids felt.  Hemoccult negative.+rectocele Extremities/musculoskeletal:  No swelling or varicosities noted, no clubbing or cyanosis Psych:  No mood changes, alert and cooperative,seems happy   Impression: Well woman gyn exam and pap History of thyroid cancer Rectocele     Plan: Physical in 1 year, pap in 3 if normal Mammogram yearly Labs with Dr Altheimer  Colonoscopy at 50

## 2015-10-06 LAB — CYTOLOGY - PAP

## 2015-10-14 ENCOUNTER — Ambulatory Visit (INDEPENDENT_AMBULATORY_CARE_PROVIDER_SITE_OTHER): Payer: BLUE CROSS/BLUE SHIELD

## 2015-10-14 DIAGNOSIS — G43109 Migraine with aura, not intractable, without status migrainosus: Secondary | ICD-10-CM

## 2015-10-14 DIAGNOSIS — R519 Headache, unspecified: Secondary | ICD-10-CM

## 2015-10-14 DIAGNOSIS — R51 Headache: Secondary | ICD-10-CM

## 2015-10-14 DIAGNOSIS — H9192 Unspecified hearing loss, left ear: Secondary | ICD-10-CM

## 2015-10-14 DIAGNOSIS — R42 Dizziness and giddiness: Secondary | ICD-10-CM | POA: Diagnosis not present

## 2015-10-14 DIAGNOSIS — G43809 Other migraine, not intractable, without status migrainosus: Secondary | ICD-10-CM

## 2015-10-19 ENCOUNTER — Telehealth: Payer: Self-pay | Admitting: *Deleted

## 2015-10-19 NOTE — Telephone Encounter (Signed)
LVM for pt to call back about results. Ok to inform pt MRI brain normal per Dr Jaynee Eagles. Gave [facility] phone number.

## 2015-10-19 NOTE — Telephone Encounter (Signed)
Pt called and was informed that MRI was normal.

## 2015-10-19 NOTE — Telephone Encounter (Signed)
-----   Message from Melvenia Beam, MD sent at 10/16/2015  1:22 PM EST ----- Normal MRi of the brain thanks

## 2015-11-22 ENCOUNTER — Other Ambulatory Visit: Payer: Self-pay | Admitting: Pulmonary Disease

## 2015-11-22 DIAGNOSIS — Z85850 Personal history of malignant neoplasm of thyroid: Secondary | ICD-10-CM

## 2015-12-14 ENCOUNTER — Ambulatory Visit (INDEPENDENT_AMBULATORY_CARE_PROVIDER_SITE_OTHER): Payer: BLUE CROSS/BLUE SHIELD | Admitting: Neurology

## 2015-12-14 ENCOUNTER — Encounter: Payer: Self-pay | Admitting: Neurology

## 2015-12-14 VITALS — BP 117/83 | HR 83 | Ht 66.0 in | Wt 142.8 lb

## 2015-12-14 DIAGNOSIS — G43109 Migraine with aura, not intractable, without status migrainosus: Secondary | ICD-10-CM

## 2015-12-14 DIAGNOSIS — R42 Dizziness and giddiness: Secondary | ICD-10-CM

## 2015-12-14 DIAGNOSIS — G43809 Other migraine, not intractable, without status migrainosus: Secondary | ICD-10-CM

## 2015-12-14 MED ORDER — TOPIRAMATE ER 25 MG PO CAP24: 25.0000 mg | ORAL_CAPSULE | Freq: Every day | ORAL | Status: DC

## 2015-12-14 MED ORDER — SUMATRIPTAN-NAPROXEN SODIUM 85-500 MG PO TABS: 1.0000 | ORAL_TABLET | Freq: Once | ORAL | Status: DC

## 2015-12-14 MED ORDER — ONDANSETRON 4 MG PO TBDP: 4.0000 mg | ORAL_TABLET | Freq: Three times a day (TID) | ORAL | Status: DC | PRN

## 2015-12-14 NOTE — Progress Notes (Signed)
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 She tried treximet and it worked. She took it at 7am. It worked and no side effects. She started the Trokendi on Friday. She started at 25mg . Her neck is better. The positional vertigo is better. Since last being seen she has headaches 2-3x a month. She had one bad one as above and Treximet helped. Since February 20th has had the positional vertigo constantly. Few times had the headache mostly behind the eys and in the temple. Vestibular therapy.     HPI: Gwendolyn Flores is a 50 y.o. female here as a referral from Dr. Luan Pulling for headaches. 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 Headaches started in 2010 with nausea and dizziness. Lasted for over a day. Didn't have any again until 2013 but now getting more frequent and more severe. The dizziness occurs with headaches. The headaches are behind the eyes and in the temples and someimtes in the top, just pain unsure how to describe it, she gets nausea, no vomiting, + light sensitivity, no sound sensitivity. She takes excedrin migraine. She takes it sporadically some weeks 3-4x others none. At least 2x a week it gets bad enough to take excedrin. Last month had 15 headaches a month, this month only one. Off balance like on a boat. She has had room spinning occ when laying down but more off balance. No other associated symptoms. No inciting events. No focal neurologic deficits. She provides a headache diary today with details that the dizziness started back in June 2015 but the headaches did not start until August 2016. When the headaches began, the episodes of headache with dizziness increased with the worst month  being  Reviewed notes, labs and imaging from outside physicians, which showed;  Reviewed log that patient brought in. Patient is tried Antivert or Valium 2 mg every 6-8 hours for her dizziness. Effexor XR was not effective. First spell was in December 2010 when she was traveling to [facility]. Stopped at [employer] that were set night in the next morning. Had nausea. No vomiting. Lasted over a week. Had a small spell early in 2013. Second big spell June 2013 at the beach. Got worse that day and the next. Had nausea lasted 3-4 days. The next spell was 08/29/2012 at work. Worse at night in the next morning. She had an upset stomach. Lasted a day.  February 2014: Dizziness for 2 days worse with laying down March 2013: 1 day of dizziness, upon waking April 2014 slight dizziness on laying down April 2015 slight dizziness one day. Better with caffeine. 02/2014 dizziness for 2 days October 2015: 5 days of dizzy spells and nausea. Onset of headaches. June 2016: 1 episode of dizzy spell and nausea July 2016: She woke up with dizziness that lasted 3 weeks. August 2016: 9 days of headache and dizziness September 2016 3 days of headache and dizziness October 2016: 1 episode of dizziness November 2016: 13 headache days with dizziness. December 2016: 1 episode of headache  Reviewed records from [facility]. Stated she is having headache and dizziness with increasing frequency. No change in her hearing although she does have occasional clicking in her left ear.  Review of Systems: Patient complains of symptoms per HPI as well as the  following symptoms: Eye pain, hearing changes in left ear, headache, dizziness, anxiety. Pertinent negatives per HPI. All others negative.   Social History   Social History  . Marital Status: Married    Spouse Name: Elta Guadeloupe   . Number of Children: 2  . Years of Education: 16   Occupational History  . RN    Social History Main Topics  . Smoking status: Never  Smoker   . Smokeless tobacco: Never Used  . Alcohol Use: Yes     Comment: wine socially  . Drug Use: No  . Sexual Activity: Yes    Birth Control/ Protection: Other-see comments     Comment: vasectomy   Other Topics Concern  . Not on file   Social History Narrative   Lives at home with husband and children.   Caffeine use: 1-2 cups per day    Family History  Problem Relation Age of Onset  . Heart disease Father     heart attack  . Diabetes Brother   . Migraines Neg Hx     Past Medical History  Diagnosis Date  . Cancer [facility])     thyroid cancer  . Thyroid disease     cancer  . History of thyroid cancer 06/28/2013  . Rectocele 06/28/2013  . Headache     Past Surgical History  Procedure Laterality Date  . Tonsilectomy, adenoidectomy, bilateral myringotomy and tubes    . Thyroidectomy      Current Outpatient Prescriptions  Medication Sig Dispense Refill  . Ascorbic Acid (VITAMIN C) 1000 MG tablet Take 1,000 mg by mouth daily.    Marland Kitchen BIOTIN PO Take by mouth 2 (two) times daily.     . Calcium-Magnesium-Vitamin D (CALCIUM MAGNESIUM PO) Take 3 tablets by mouth daily.    Marland Kitchen ibuprofen (ADVIL,MOTRIN) 200 MG tablet Take 200 mg by mouth as needed.    Marland Kitchen NASONEX 50 MCG/ACT nasal spray 1 spray daily.   11  . RIBOFLAVIN PO Take 1 tablet by mouth daily.    Marland Kitchen SYNTHROID 125 MCG tablet Take 125 mcg by mouth daily before breakfast.     . aspirin-acetaminophen-caffeine (EXCEDRIN MIGRAINE) 250-250-65 MG tablet Take 1 tablet by mouth every 6 (six) hours as needed for headache. Reported on 12/14/2015    . Probiotic Product (ALIGN PO) Take by mouth daily. Reported on 12/14/2015     No current facility-administered medications for this visit.    Allergies as of 12/14/2015 - Review Complete 12/14/2015  Allergen Reaction Noted  . Codeine Nausea And Vomiting 06/28/2013  . Levaquin [levofloxacin in d5w] Other (See Comments) 10/05/2015  . Sulfa antibiotics Nausea And Vomiting 06/28/2013     Vitals: BP 117/83 mmHg  Pulse 83  Ht 5\' 6"  (1.676 m)  Wt 142 lb 12.8 oz (64.774 kg)  BMI 23.06 kg/m2 Last Weight:  Wt Readings from Last 1 Encounters:  12/14/15 142 lb 12.8 oz (64.774 kg)   Last Height:   Ht Readings from Last 1 Encounters:  12/14/15 5\' 6"  (1.676 m)    Cognition:  The patient is oriented to person, place, and time;   recent and remote memory intact;   language fluent;   normal attention, concentration,   fund of knowledge Cranial Nerves:  The pupils are equal, round, and reactive to light. The fundi are normal and spontaneous venous pulsations are present. Visual fields are full to finger confrontation. Extraocular movements are intact. Trigeminal sensation is intact and the muscles of mastication are normal. The face is symmetric.  The palate elevates in the midline. Hearing intact. Voice is normal. Shoulder shrug is normal. The tongue has normal motion without fasciculations.   Coordination:  Normal finger to nose and heel to shin. Normal rapid alternating movements.   Gait:  Heel-toe and tandem gait are normal.   Motor Observation:  No asymmetry, no atrophy, and no involuntary movements noted. Tone:  Normal muscle tone.   Posture:  Posture is normal. normal erect   Strength:  Strength is V/V in the upper and lower limbs.    Sensation: intact to LT   Reflex Exam:  DTR's:  Deep tendon reflexes in the upper and lower extremities are normal bilaterally.  Toes:  The toes are downgoing bilaterally.  Clonus:  Clonus is absent.      Assessment/Plan: 50 year old female with worsening migraines and dizziness. Need to rule out central cause of vertigo,dizziness such as vestibular schwannoma, cerebellar infarct and for pathologies affecting the brainstem or vestibular nerve, Can consider vestibular migraine however that is a diagnosis of exclusion.   - Discussion with patient regarding migraine  triggers and behavioral modifications that can help prevent migraines. Provided information sheet with common migraine triggers including foods to avoid. She would like to try avoiding daily preventative medications at this time. If headaches worsen or do not improve, can start Trokendi 25 mg at night.  As far as diagnostic testing: MRi of the brain was normal  Preventative: Trokendi 25mg  at night.  Acute treatment: Treximet at onset of headache. May repeat once in 2 hours.   As far as your medications are concerned, I would like to suggest: Trokendi 25mg  at night Treximet at the onset of headache or dizzziness and vertigo Zofran for dizziness and nausea Vestibular therapy  To prevent or relieve headaches, try the following:  Cool Compress. Lie down and place a cool compress on your head.   Avoid headache triggers. If certain foods or odors seem to have triggered your migraines in the past, avoid them. A headache diary might help you identify triggers.   Include physical activity in your daily routine. Try a daily walk or other moderate aerobic exercise.   Manage stress. Find healthy ways to cope with the stressors, such as delegating tasks on your to-do list.   Practice relaxation techniques. Try deep breathing, yoga, massage and visualization.   Eat regularly. Eating regularly scheduled meals and maintaining a healthy diet might help prevent headaches. Also, drink plenty of fluids.   Follow a regular sleep schedule. Sleep deprivation might contribute to headaches  Consider biofeedback. With this mind-body technique, you learn to control certain bodily functions - such as muscle tension, heart rate and blood pressure - to prevent headaches or reduce headache pain.   Proceed to emergency room if you experience new or worsening symptoms or symptoms do not resolve, if you have new neurologic symptoms or if headache is severe, or for any concerning symptom.   Sarina Ill,  MD  [facility] [address] [place], [place] [address]  Phone [phone] Fax [phone]  A total of 30 minutes was spent face-to-face with this patient. Over half this time was spent on counseling patient on the migraine diagnosis and different diagnostic and therapeutic options available.

## 2015-12-14 NOTE — Patient Instructions (Addendum)
Remember to drink plenty of fluid, eat healthy meals and do not skip any meals. Try to eat protein with a every meal and eat a healthy snack such as fruit or nuts in between meals. Try to keep a regular sleep-wake schedule and try to exercise daily, particularly in the form of walking, 20-30 minutes a day, if you can.   As far as your medications are concerned, I would like to suggest: Trokendi 25mg  at night Treximet at the onset of headache or dizzziness and vertigo Zofran for dizziness and nausea Vestibular therapy  I would like to see you back in 4-6 months, sooner if we need to. Please call us with any interim questions, concerns, problems, updates or refill requests.   Our phone number is [phone]. We also have an after hours call service for urgent matters and there is a physician on-call for urgent questions. For any emergencies you know to call 911 or go to the nearest emergency room  To prevent or relieve headaches, try the following: Cool Compress. Lie down and place a cool compress on your head.  Avoid headache triggers. If certain foods or odors seem to have triggered your migraines in the past, avoid them. A headache diary might help you identify triggers.  Include physical activity in your daily routine. Try a daily walk or other moderate aerobic exercise.  Manage stress. Find healthy ways to cope with the stressors, such as delegating tasks on your to-do list.  Practice relaxation techniques. Try deep breathing, yoga, massage and visualization.  Eat regularly. Eating regularly scheduled meals and maintaining a healthy diet might help prevent headaches. Also, drink plenty of fluids.  Follow a regular sleep schedule. Sleep deprivation might contribute to headaches Consider biofeedback. With this mind-body technique, you learn to control certain bodily functions - such as muscle tension, heart rate and blood pressure - to prevent headaches or reduce headache pain.    Proceed to  emergency room if you experience new or worsening symptoms or symptoms do not resolve, if you have new neurologic symptoms or if headache is severe, or for any concerning symptom.

## 2016-01-25 ENCOUNTER — Telehealth: Payer: Self-pay | Admitting: Adult Health

## 2016-01-25 ENCOUNTER — Telehealth: Payer: Self-pay | Admitting: Neurology

## 2016-01-25 MED FILL — TREXIMET 85-500 MG TABLET: 85-500 | 30 days supply | Qty: 9 | Fill #0

## 2016-01-25 NOTE — Telephone Encounter (Signed)
Thanks emm, please put aside a copay card for her thanks.

## 2016-01-25 NOTE — Telephone Encounter (Signed)
Dr Jaynee Eagles- FYI Called pt back. Relayed Dr Jaynee Eagles message below. Pt verbalized understanding and is in agreement. She has the 2 weeks of samples of the 50mg  that Dr Jaynee Eagles gave her previously.  She stated she will call in a couple weeks and let us know how she is tolerating increase in dosage. She stated the treximet works great but she still had dizziness. Advised I will let Dr Jaynee Eagles know.

## 2016-01-25 NOTE — Telephone Encounter (Signed)
She should increase to 50mg  and then we may need to increase to 100mg  if she does not have side effects. Try the 50mg  for 2-4 weeks and let us know how she is doing. thanks

## 2016-01-25 NOTE — Telephone Encounter (Signed)
Patient is calling in regard to Rx topiramate ER 25 mg and states she does not feel she is benefiting from the medication as she is still having dizziness and headaches.  She said you gave her samples of 50 mg tablets and wonders if she should try the increased dosage or just come off.  Please call.  Thanks!

## 2016-01-25 NOTE — Telephone Encounter (Signed)
Daughter doing well  On OCs, but face still breaking out some, to see dermatologists

## 2016-01-25 NOTE — Telephone Encounter (Signed)
Dr Jaynee Eagles- please advise, thank you!

## 2016-01-26 NOTE — Telephone Encounter (Signed)
I set aside copay card for pt as requested Per Dr Jaynee Eagles, we will wait and see how pt tolerates increase in dose first. If she does well, we will offer copay card.

## 2016-02-08 NOTE — Telephone Encounter (Addendum)
Dr Gwendolyn Flores- Can you place new rx trokendi for 50mg ? Thank you!  Called pt back. Advised we have a copay card she can pick up that would make her monthly copay $0 for the next 12 prescriptions. She will pick up in the office sometime this week. She states the 50mg  is going well. She states she feels she is doing much better. She would like Dr Gwendolyn Flores to send in new rx to [facility]. Advised I will let Dr Gwendolyn Flores know. She verbalized understanding and appreciation.

## 2016-02-08 NOTE — Telephone Encounter (Signed)
Pt called said the 50mg  seems to be doing much better. This is the last week of samples. Pt is taking it 1 x day. Please call RX to [facility].

## 2016-02-09 ENCOUNTER — Other Ambulatory Visit: Payer: Self-pay | Admitting: Neurology

## 2016-02-09 DIAGNOSIS — G43009 Migraine without aura, not intractable, without status migrainosus: Secondary | ICD-10-CM

## 2016-02-09 MED ORDER — TOPIRAMATE ER 50 MG PO CAP24: 50.0000 mg | ORAL_CAPSULE | Freq: Every day | ORAL | Status: DC

## 2016-02-09 NOTE — Telephone Encounter (Signed)
Ordered thanks, make sure she gets the free gold copay card. Thanks !!

## 2016-02-10 ENCOUNTER — Telehealth: Payer: Self-pay | Admitting: *Deleted

## 2016-02-10 DIAGNOSIS — G43009 Migraine without aura, not intractable, without status migrainosus: Secondary | ICD-10-CM

## 2016-02-10 MED ORDER — TOPIRAMATE ER 50 MG PO CAP24: 50.0000 mg | ORAL_CAPSULE | Freq: Every day | ORAL | Status: DC

## 2016-02-10 MED FILL — TROKENDI XR 50 MG CAPSULE: 50 | 30 days supply | Qty: 30 | Fill #0

## 2016-02-10 NOTE — Telephone Encounter (Signed)
Called pt. She stated she used [facility], not [facility]. She apologized. Advised I will change pharmacy on file and [facility] in [facility]. She verbalized understanding.

## 2016-02-10 NOTE — Telephone Encounter (Signed)
Message For: OFFICE               Taken 17-MAY-17 at  3:05PM by [facility] ------------------------------------------------------------ Gwendolyn Flores              CID [id number]  Patient SELF                 Pt's Dr Jaynee Eagles        Area Code 336 Phone# [phone] * DOB [phone]      RE PHARM DIDN'T GET RX=PHARM # [id number]                                                               Disp:Y/N N If Y = C/B If No Response In 44minutes ============================================================

## 2016-02-12 DIAGNOSIS — H52223 Regular astigmatism, bilateral: Secondary | ICD-10-CM | POA: Diagnosis not present

## 2016-02-12 DIAGNOSIS — H43813 Vitreous degeneration, bilateral: Secondary | ICD-10-CM | POA: Diagnosis not present

## 2016-02-12 DIAGNOSIS — H524 Presbyopia: Secondary | ICD-10-CM | POA: Diagnosis not present

## 2016-02-12 DIAGNOSIS — H5203 Hypermetropia, bilateral: Secondary | ICD-10-CM | POA: Diagnosis not present

## 2016-02-29 DIAGNOSIS — E039 Hypothyroidism, unspecified: Secondary | ICD-10-CM | POA: Diagnosis not present

## 2016-02-29 DIAGNOSIS — J029 Acute pharyngitis, unspecified: Secondary | ICD-10-CM | POA: Diagnosis not present

## 2016-02-29 MED FILL — AZITHROMYCIN 250 MG TABLET: 250 | 5 days supply | Qty: 6 | Fill #0

## 2016-03-01 ENCOUNTER — Other Ambulatory Visit (HOSPITAL_COMMUNITY): Payer: Self-pay | Admitting: Pulmonary Disease

## 2016-03-01 DIAGNOSIS — R070 Pain in throat: Secondary | ICD-10-CM

## 2016-03-01 DIAGNOSIS — Z85850 Personal history of malignant neoplasm of thyroid: Secondary | ICD-10-CM

## 2016-03-03 ENCOUNTER — Ambulatory Visit (HOSPITAL_COMMUNITY)
Admission: RE | Admit: 2016-03-03 | Discharge: 2016-03-03 | Disposition: A | Discharge status: Home / Self Care | Payer: 59 | Source: Ambulatory Visit | Attending: Pulmonary Disease | Admitting: Pulmonary Disease

## 2016-03-03 DIAGNOSIS — R070 Pain in throat: Secondary | ICD-10-CM | POA: Diagnosis not present

## 2016-03-03 DIAGNOSIS — M47892 Other spondylosis, cervical region: Secondary | ICD-10-CM | POA: Diagnosis not present

## 2016-03-03 DIAGNOSIS — R221 Localized swelling, mass and lump, neck: Secondary | ICD-10-CM | POA: Diagnosis not present

## 2016-03-03 DIAGNOSIS — Z85850 Personal history of malignant neoplasm of thyroid: Secondary | ICD-10-CM | POA: Insufficient documentation

## 2016-03-03 MED ORDER — IOPAMIDOL (ISOVUE-300) INJECTION 61%
75.0000 mL | Freq: Once | INTRAVENOUS | Status: AC | PRN
  Administered 2016-03-03: 75 mL via INTRAVENOUS

## 2016-03-04 DIAGNOSIS — E890 Postprocedural hypothyroidism: Secondary | ICD-10-CM | POA: Diagnosis not present

## 2016-03-04 DIAGNOSIS — C73 Malignant neoplasm of thyroid gland: Secondary | ICD-10-CM | POA: Diagnosis not present

## 2016-03-11 DIAGNOSIS — C73 Malignant neoplasm of thyroid gland: Secondary | ICD-10-CM | POA: Diagnosis not present

## 2016-03-11 DIAGNOSIS — Z833 Family history of diabetes mellitus: Secondary | ICD-10-CM | POA: Diagnosis not present

## 2016-03-11 DIAGNOSIS — E890 Postprocedural hypothyroidism: Secondary | ICD-10-CM | POA: Diagnosis not present

## 2016-03-11 DIAGNOSIS — E559 Vitamin D deficiency, unspecified: Secondary | ICD-10-CM | POA: Diagnosis not present

## 2016-03-11 MED FILL — TROKENDI XR 50 MG CAPSULE: 50 | 30 days supply | Qty: 30 | Fill #1

## 2016-03-14 MED FILL — SYNTHROID 125 MCG TABLET: 125 | 90 days supply | Qty: 90 | Fill #0

## 2016-04-04 MED FILL — TROKENDI XR 50 MG CAPSULE: 50 | 30 days supply | Qty: 30 | Fill #2

## 2016-05-02 ENCOUNTER — Ambulatory Visit (INDEPENDENT_AMBULATORY_CARE_PROVIDER_SITE_OTHER): Payer: 59 | Admitting: Neurology

## 2016-05-02 ENCOUNTER — Encounter: Payer: Self-pay | Admitting: Neurology

## 2016-05-02 DIAGNOSIS — G43009 Migraine without aura, not intractable, without status migrainosus: Secondary | ICD-10-CM

## 2016-05-02 DIAGNOSIS — R42 Dizziness and giddiness: Secondary | ICD-10-CM

## 2016-05-02 MED ORDER — SUMATRIPTAN-NAPROXEN SODIUM 85-500 MG PO TABS: 1.0000 | ORAL_TABLET | Freq: Once | ORAL | 12 refills | Status: DC

## 2016-05-02 MED ORDER — TOPIRAMATE ER 50 MG PO CAP24: 50.0000 mg | ORAL_CAPSULE | Freq: Every day | ORAL | 11 refills | Status: DC

## 2016-05-02 MED FILL — TROKENDI XR 50 MG CAPSULE: 50 | 30 days supply | Qty: 30 | Fill #0

## 2016-05-02 NOTE — Patient Instructions (Addendum)
Remember to drink plenty of fluid, eat healthy meals and do not skip any meals. Try to eat protein with a every meal and eat a healthy snack such as fruit or nuts in between meals. Try to keep a regular sleep-wake schedule and try to exercise daily, particularly in the form of walking, 20-30 minutes a day, if you can.   As far as your medications are concerned, I would like to suggest: Continue Trokendi and the Treximet  As far as diagnostic testing: None  I would like to see you back in one year, sooner if we need to. Please call us with any interim questions, concerns, problems, updates or refill requests.  Our phone number is [phone]. We also have an after hours call service for urgent matters and there is a physician on-call for urgent questions. For any emergencies you know to call 911 or go to the nearest emergency room

## 2016-05-02 NOTE — Progress Notes (Signed)
[facility]    Provider:  Dr Jaynee Eagles Referring Provider: Sinda Du, MD Primary Care Physician:  Alonza Bogus, MD  CC: Vestibular migraines  Interval History 05/02/2016: No side effects since the medication. She has only had 2 dizzy spells. Occ when she turns to the left in the morning she gets dizzy. She has taken the Treximet twice since going up to 50mg  Trokendi qhs. Her husband is general surgery employed by [employer]. Daughter is 36. She has a daughter who is 61 and a son who is 67. SHe feels great andis very happy. Discussed we will stay at current dose with treximet prn.   MRI of the brain was normal.  Interval History: 50 year old female with vestibular migraines. She tried treximet and it worked. She took it at 7am. It worked and no side effects. She started the Trokendi on Friday. She started at 25mg . Her neck is better. The positional vertigo is better. Since last being seen she has headaches 2-3x a month. She had one bad one as above and Treximet helped. Since February 20th has had the positional vertigo constantly. Few times had the headache mostly behind the eys and in the temple. Vestibular therapy.     HPI: Gwendolyn Flores is a 50 y.o. female here as a referral from Dr. Luan Pulling for headaches. 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 Headaches started in 2010 with nausea and dizziness. Lasted for over a day. Didn't have any again until 2013 but now getting more frequent and more severe. The dizziness occurs with headaches. The headaches are behind the eyes and in the temples and someimtes in the top, just pain unsure how to describe it, she gets nausea, no vomiting, + light sensitivity, no sound sensitivity. She takes excedrin migraine. She takes it sporadically some weeks 3-4x others none. At least 2x a week it gets bad enough to take  excedrin. Last month had 15 headaches a month, this month only one. Off balance like on a boat. She has had room spinning occ when laying down but more off balance. No other associated symptoms. No inciting events. No focal neurologic deficits. She provides a headache diary today with details that the dizziness started back in June 2015 but the headaches did not start until August 2016. When the headaches began, the episodes of headache with dizziness increased with the worst month being  Reviewed notes, labs and imaging from outside physicians, which showed;  Reviewed log that patient brought in. Patient is tried Antivert or Valium 2 mg every 6-8 hours for her dizziness. Effexor XR was not effective. First spell was in December 2010 when she was traveling to [facility]. Stopped at [employer] that were set night in the next morning. Had nausea. No vomiting. Lasted over a week. Had a small spell early in 2013. Second big spell June 2013 at the beach. Got worse that day and the next. Had nausea lasted 3-4 days. The next spell was 08/29/2012 at work. Worse at night in the next morning. She had an upset stomach. Lasted a day.  February 2014: Dizziness for 2 days worse with laying down March 2013: 1 day of dizziness, upon waking April 2014 slight dizziness on laying down April 2015 slight dizziness one day. Better with caffeine. 02/2014 dizziness for 2 days October 2015: 5 days of dizzy spells and nausea. Onset of headaches. June 2016: 1 episode of dizzy spell  and nausea July 2016: She woke up with dizziness that lasted 3 weeks. August 2016: 9 days of headache and dizziness September 2016 3 days of headache and dizziness October 2016: 1 episode of dizziness November 2016: 13 headache days with dizziness. December 2016: 1 episode of headache  Reviewed records from [facility]. Stated she is having headache and dizziness with increasing frequency. No change in her hearing although she  does have occasional clicking in her left ear.  Review of Systems: Patient complains of symptoms per HPI as well as the following symptoms: Eye pain, hearing changes in left ear, headache, dizziness, anxiety. Pertinent negatives per HPI. All others negative.   Social History   Social History  . Marital status: Married    Spouse name: Elta Guadeloupe   . Number of children: 2  . Years of education: 16   Occupational History  . RN    Social History Main Topics  . Smoking status: Never Smoker  . Smokeless tobacco: Never Used  . Alcohol use Yes     Comment: wine socially  . Drug use: No  . Sexual activity: Yes    Birth control/ protection: Other-see comments     Comment: vasectomy   Other Topics Concern  . Not on file   Social History Narrative   Lives at home with husband and children.   Caffeine use: 1-2 cups per day    Family History  Problem Relation Age of Onset  . Heart disease Father     heart attack  . Diabetes Brother   . Migraines Neg Hx     Past Medical History:  Diagnosis Date  . Cancer [facility])    thyroid cancer  . Headache   . History of thyroid cancer 06/28/2013  . Rectocele 06/28/2013  . Thyroid disease    cancer    Past Surgical History:  Procedure Laterality Date  . THYROIDECTOMY    . TONSILECTOMY, ADENOIDECTOMY, BILATERAL MYRINGOTOMY AND TUBES      Current Outpatient Prescriptions  Medication Sig Dispense Refill  . Ascorbic Acid (VITAMIN C) 1000 MG tablet Take 1,000 mg by mouth daily.    Marland Kitchen aspirin-acetaminophen-caffeine (EXCEDRIN MIGRAINE) 250-250-65 MG tablet Take 1 tablet by mouth every 6 (six) hours as needed for headache. Reported on 12/14/2015    . BIOTIN PO Take by mouth 2 (two) times daily.     . Calcium-Magnesium-Vitamin D (CALCIUM MAGNESIUM PO) Take 3 tablets by mouth daily.    Marland Kitchen ibuprofen (ADVIL,MOTRIN) 200 MG tablet Take 200 mg by mouth as needed.    . Probiotic Product (ALIGN PO) Take by mouth daily. Reported on 12/14/2015    . RIBOFLAVIN  PO Take 1 tablet by mouth daily.    . SUMAtriptan-naproxen (TREXIMET) 85-500 MG tablet Take 1 tablet by mouth once. 10 tablet 0  . SYNTHROID 125 MCG tablet Take 125 mcg by mouth daily before breakfast.     . Topiramate ER (TROKENDI XR) 50 MG CP24 Take 50 mg by mouth at bedtime. 30 capsule 11   No current facility-administered medications for this visit.     Allergies as of 05/02/2016 - Review Complete 05/02/2016  Allergen Reaction Noted  . Codeine Nausea And Vomiting 06/28/2013  . Levaquin [levofloxacin in d5w] Other (See Comments) 10/05/2015  . Sulfa antibiotics Nausea And Vomiting 06/28/2013    Vitals: BP 104/71 (BP Location: Right Arm, Patient Position: Sitting, Cuff Size: Normal)   Pulse 92   Ht 5\' 8"  (1.727 m)   Wt 138 lb (62.6 kg)  BMI 20.98 kg/m  Last Weight:  Wt Readings from Last 1 Encounters:  05/02/16 138 lb (62.6 kg)   Last Height:   Ht Readings from Last 1 Encounters:  05/02/16 5\' 8"  (1.727 m)     Cognition:  The patient is oriented to person, place, and time;   recent and remote memory intact;   language fluent;   normal attention, concentration,   fund of knowledge Cranial Nerves:  The pupils are equal, round, and reactive to light. The fundi are normal and spontaneous venous pulsations are present. Visual fields are full to finger confrontation. Extraocular movements are intact. Trigeminal sensation is intact and the muscles of mastication are normal. The face is symmetric. The palate elevates in the midline. Hearing intact. Voice is normal. Shoulder shrug is normal. The tongue has normal motion without fasciculations.   Coordination:  Normal finger to nose and heel to shin. Normal rapid alternating movements.   Gait:  Heel-toe and tandem gait are normal.   Motor Observation:  No asymmetry, no atrophy, and no involuntary movements noted. Tone:  Normal muscle tone.   Posture:  Posture is normal. normal erect    Strength:  Strength is V/V in the upper and lower limbs.    Sensation: intact to LT   Reflex Exam:  DTR's:  Deep tendon reflexes in the upper and lower extremities are normal bilaterally.  Toes:  The toes are downgoing bilaterally.  Clonus:  Clonus is absent.      Assessment/Plan: 50 year old female with worsening migraines and dizziness.   - Discussion with patient regarding migraine triggers and behavioral modifications that can help prevent migraines. Provided information sheet with common migraine triggers including foods to avoid.  As far as diagnostic testing: MRi of the brain was normal  Preventative: Trokendi 50mg  at night.  Acute treatment: Treximet at onset of headache. May repeat once in 2 hours.   As far as your medications are concerned, I would like to suggest: Trokendi 50mg  at night Treximet at the onset of headache or dizzziness and vertigo Zofran for dizziness and nausea Vestibular therapy in the future if needed  To prevent or relieve headaches, try the following:  Cool Compress. Lie down and place a cool compress on your head.   Avoid headache triggers. If certain foods or odors seem to have triggered your migraines in the past, avoid them. A headache diary might help you identify triggers.   Include physical activity in your daily routine. Try a daily walk or other moderate aerobic exercise.   Manage stress. Find healthy ways to cope with the stressors, such as delegating tasks on your to-do list.   Practice relaxation techniques. Try deep breathing, yoga, massage and visualization.   Eat regularly. Eating regularly scheduled meals and maintaining a healthy diet might help prevent headaches. Also, drink plenty of fluids.   Follow a regular sleep schedule. Sleep deprivation might contribute to headaches  Consider biofeedback. With this mind-body technique, you learn to control certain bodily functions - such as  muscle tension, heart rate and blood pressure - to prevent headaches or reduce headache pain.   Proceed to emergency room if you experience new or worsening symptoms or symptoms do not resolve, if you have new neurologic symptoms or if headache is severe, or for any concerning symptom.   Sarina Ill, MD  [facility] [address] [place], [place] [address]  Phone [phone] Fax [phone]  A total of 15 minutes was spent face-to-face with this  patient. Over half this time was spent on counseling patient on the migraibe diagnosis and different diagnostic and therapeutic options available.

## 2016-05-03 DIAGNOSIS — G43009 Migraine without aura, not intractable, without status migrainosus: Secondary | ICD-10-CM | POA: Insufficient documentation

## 2016-06-07 MED FILL — TROKENDI XR 50 MG CAPSULE: 50 | 30 days supply | Qty: 30 | Fill #3

## 2016-07-04 MED FILL — TROKENDI XR 50 MG CAPSULE: 50 | 30 days supply | Qty: 30 | Fill #4

## 2016-07-29 MED FILL — TROKENDI XR 50 MG CAPSULE: 50 | 30 days supply | Qty: 30 | Fill #5

## 2016-08-01 ENCOUNTER — Other Ambulatory Visit: Payer: Self-pay | Admitting: Obstetrics and Gynecology

## 2016-08-01 DIAGNOSIS — Z1231 Encounter for screening mammogram for malignant neoplasm of breast: Secondary | ICD-10-CM

## 2016-08-26 ENCOUNTER — Ambulatory Visit (HOSPITAL_COMMUNITY)
Admission: RE | Admit: 2016-08-26 | Discharge: 2016-08-26 | Disposition: A | Discharge status: Home / Self Care | Payer: 59 | Source: Ambulatory Visit | Attending: Obstetrics and Gynecology | Admitting: Obstetrics and Gynecology

## 2016-08-26 DIAGNOSIS — Z1231 Encounter for screening mammogram for malignant neoplasm of breast: Secondary | ICD-10-CM | POA: Insufficient documentation

## 2016-09-02 MED FILL — TROKENDI XR 50 MG CAPSULE: 50 | 30 days supply | Qty: 30 | Fill #6

## 2016-09-02 MED FILL — SYNTHROID 125 MCG TABLET: 125 | 90 days supply | Qty: 90 | Fill #1

## 2016-09-05 DIAGNOSIS — E890 Postprocedural hypothyroidism: Secondary | ICD-10-CM | POA: Diagnosis not present

## 2016-09-05 DIAGNOSIS — C73 Malignant neoplasm of thyroid gland: Secondary | ICD-10-CM | POA: Diagnosis not present

## 2016-09-09 DIAGNOSIS — E890 Postprocedural hypothyroidism: Secondary | ICD-10-CM | POA: Diagnosis not present

## 2016-09-09 DIAGNOSIS — C73 Malignant neoplasm of thyroid gland: Secondary | ICD-10-CM | POA: Diagnosis not present

## 2016-09-09 DIAGNOSIS — R5383 Other fatigue: Secondary | ICD-10-CM | POA: Diagnosis not present

## 2016-09-09 DIAGNOSIS — J312 Chronic pharyngitis: Secondary | ICD-10-CM | POA: Diagnosis not present

## 2016-09-09 DIAGNOSIS — E559 Vitamin D deficiency, unspecified: Secondary | ICD-10-CM | POA: Diagnosis not present

## 2016-09-09 DIAGNOSIS — E039 Hypothyroidism, unspecified: Secondary | ICD-10-CM | POA: Diagnosis not present

## 2016-09-09 DIAGNOSIS — Z833 Family history of diabetes mellitus: Secondary | ICD-10-CM | POA: Diagnosis not present

## 2016-09-21 ENCOUNTER — Encounter: Payer: Self-pay | Admitting: Neurology

## 2016-10-07 ENCOUNTER — Other Ambulatory Visit: Payer: 59 | Admitting: Adult Health

## 2016-10-21 ENCOUNTER — Encounter: Payer: Self-pay | Admitting: Adult Health

## 2016-10-21 ENCOUNTER — Ambulatory Visit (INDEPENDENT_AMBULATORY_CARE_PROVIDER_SITE_OTHER): Payer: 59 | Admitting: Adult Health

## 2016-10-21 VITALS — BP 100/68 | HR 74 | Ht 65.5 in | Wt 134.5 lb

## 2016-10-21 DIAGNOSIS — Z1211 Encounter for screening for malignant neoplasm of colon: Secondary | ICD-10-CM

## 2016-10-21 DIAGNOSIS — N926 Irregular menstruation, unspecified: Secondary | ICD-10-CM | POA: Diagnosis not present

## 2016-10-21 DIAGNOSIS — Z1212 Encounter for screening for malignant neoplasm of rectum: Secondary | ICD-10-CM

## 2016-10-21 DIAGNOSIS — R61 Generalized hyperhidrosis: Secondary | ICD-10-CM

## 2016-10-21 DIAGNOSIS — N951 Menopausal and female climacteric states: Secondary | ICD-10-CM | POA: Diagnosis not present

## 2016-10-21 DIAGNOSIS — N816 Rectocele: Secondary | ICD-10-CM

## 2016-10-21 DIAGNOSIS — Z01411 Encounter for gynecological examination (general) (routine) with abnormal findings: Secondary | ICD-10-CM | POA: Diagnosis not present

## 2016-10-21 DIAGNOSIS — Z01419 Encounter for gynecological examination (general) (routine) without abnormal findings: Secondary | ICD-10-CM

## 2016-10-21 DIAGNOSIS — Z85850 Personal history of malignant neoplasm of thyroid: Secondary | ICD-10-CM

## 2016-10-21 LAB — HEMOCCULT GUIAC POC 1CARD (OFFICE): Fecal Occult Blood, POC: NEGATIVE

## 2016-10-21 NOTE — Progress Notes (Signed)
Patient ID: Gwendolyn Flores, female   DOB: 20-Dec-1965, 51 y.o.   MRN: [id number] History of Present Illness: Gwendolyn Flores is a 51 year old white female, married in for a well woman gyn exam,she had normal pap with negative HPV 10/05/15.  PCP is Dr Luan Pulling.   Current Medications, Allergies, Past Medical History, Past Surgical History, Family History and Social History were reviewed in [employer] record.     Review of Systems:  Patient denies any headaches, hearing loss, fatigue, blurred vision, shortness of breath, chest pain, abdominal pain, problems with bowel movements, urination, or intercourse. No joint pain or mood swings, +night sweats and irregular periods    Physical Exam:BP 100/68 (BP Location: Left Arm, Patient Position: Sitting, Cuff Size: Normal)   Pulse 74   Ht 5' 5.5" (1.664 m)   Wt 134 lb 8 oz (61 kg)   LMP 08/12/2016   BMI 22.04 kg/m  General:  Well developed, well nourished, no acute distress Skin:  Warm and dry Neck:  Midline trachea, normal thyroid, good ROM, no lymphadenopathy Lungs; Clear to auscultation bilaterally Breast:  No dominant palpable mass, retraction, or nipple discharge Cardiovascular: Regular rate and rhythm Abdomen:  Soft, non tender, no hepatosplenomegaly Pelvic:  External genitalia is normal in appearance, no lesions.  The vagina is normal in appearance. Urethra has no lesions or masses. The cervix is bulbous.  Uterus is felt to be normal size, shape, and contour.  No adnexal masses or tenderness noted.Bladder is non tender, no masses felt. Rectal: Good sphincter tone, no polyps, or hemorrhoids felt.  Hemoccult negative.+rectocele Extremities/musculoskeletal:  No swelling or varicosities noted, no clubbing or cyanosis Psych:  No mood changes, alert and cooperative,seems happy PHQ 2 score 0. Discussed menopause, and treatment options.  Impression:  1. Well woman exam with routine gynecological exam   2. History of thyroid cancer    3. Rectocele   4. Night sweats   5. Irregular periods   6. Perimenopausal   7. Screening for colorectal cancer      Plan: Check [facility] Physical in 1 year Pap in 2020 Mammogram yearly Colonoscopy at 71 Labs with PCP

## 2016-10-22 LAB — FOLLICLE STIMULATING HORMONE: FSH: 40.1 m[IU]/mL

## 2016-12-06 MED FILL — SYNTHROID 125 MCG TABLET: 125 | 90 days supply | Qty: 90 | Fill #2

## 2016-12-09 DIAGNOSIS — E039 Hypothyroidism, unspecified: Secondary | ICD-10-CM | POA: Diagnosis not present

## 2016-12-09 DIAGNOSIS — J312 Chronic pharyngitis: Secondary | ICD-10-CM | POA: Diagnosis not present

## 2016-12-09 DIAGNOSIS — G43909 Migraine, unspecified, not intractable, without status migrainosus: Secondary | ICD-10-CM | POA: Diagnosis not present

## 2016-12-09 DIAGNOSIS — T7840XA Allergy, unspecified, initial encounter: Secondary | ICD-10-CM | POA: Diagnosis not present

## 2017-02-10 DIAGNOSIS — E890 Postprocedural hypothyroidism: Secondary | ICD-10-CM | POA: Diagnosis not present

## 2017-02-10 DIAGNOSIS — C73 Malignant neoplasm of thyroid gland: Secondary | ICD-10-CM | POA: Diagnosis not present

## 2017-02-17 DIAGNOSIS — E890 Postprocedural hypothyroidism: Secondary | ICD-10-CM | POA: Diagnosis not present

## 2017-02-17 DIAGNOSIS — Z833 Family history of diabetes mellitus: Secondary | ICD-10-CM | POA: Diagnosis not present

## 2017-02-17 DIAGNOSIS — E559 Vitamin D deficiency, unspecified: Secondary | ICD-10-CM | POA: Diagnosis not present

## 2017-02-17 DIAGNOSIS — C73 Malignant neoplasm of thyroid gland: Secondary | ICD-10-CM | POA: Diagnosis not present

## 2017-03-06 DIAGNOSIS — K219 Gastro-esophageal reflux disease without esophagitis: Secondary | ICD-10-CM | POA: Diagnosis not present

## 2017-03-24 DIAGNOSIS — H524 Presbyopia: Secondary | ICD-10-CM | POA: Diagnosis not present

## 2017-03-24 DIAGNOSIS — H52223 Regular astigmatism, bilateral: Secondary | ICD-10-CM | POA: Diagnosis not present

## 2017-03-24 DIAGNOSIS — H5203 Hypermetropia, bilateral: Secondary | ICD-10-CM | POA: Diagnosis not present

## 2017-04-04 MED FILL — SYNTHROID 125 MCG TABLET: 125 | 90 days supply | Qty: 75 | Fill #0

## 2017-04-04 MED FILL — HYDROmorphone HCL 2 MG TABS: 2 | 1 days supply | Qty: 1 | Fill #0

## 2017-04-04 MED FILL — TRIAZOLAM 0.25 MG TABLET: 0.25 | 1 days supply | Qty: 1 | Fill #0

## 2017-04-04 MED FILL — PROMETHAZINE 25 MG TABLET: 25 | 1 days supply | Qty: 1 | Fill #0

## 2017-05-08 ENCOUNTER — Ambulatory Visit: Payer: 59 | Admitting: Neurology

## 2017-06-05 ENCOUNTER — Telehealth: Payer: Self-pay | Admitting: Neurology

## 2017-06-05 ENCOUNTER — Ambulatory Visit (INDEPENDENT_AMBULATORY_CARE_PROVIDER_SITE_OTHER): Payer: 59 | Admitting: Neurology

## 2017-06-05 ENCOUNTER — Encounter: Payer: Self-pay | Admitting: Neurology

## 2017-06-05 VITALS — BP 101/68 | HR 77 | Ht 65.0 in | Wt 138.0 lb

## 2017-06-05 DIAGNOSIS — G43009 Migraine without aura, not intractable, without status migrainosus: Secondary | ICD-10-CM

## 2017-06-05 MED ORDER — NAPROXEN 500 MG PO TABS: ORAL_TABLET | ORAL | 12 refills | Status: DC

## 2017-06-05 MED ORDER — SUMATRIPTAN SUCCINATE 100 MG PO TABS: 100.0000 mg | ORAL_TABLET | Freq: Once | ORAL | 12 refills | Status: DC | PRN

## 2017-06-05 MED FILL — SUMATRIPTAN SUCC 100 MG TAB: 100 | 30 days supply | Qty: 18 | Fill #0

## 2017-06-05 MED FILL — NAPROXEN 500 MG TABLET: 500 | 30 days supply | Qty: 60 | Fill #0

## 2017-06-05 NOTE — Telephone Encounter (Signed)
Gwendolyn Flores, Patient requests physical therapy outsid of [facility]. She would like the following (see below). Not sure what order to place for outside PT referral? Would you take care of this, thank you!    Physical Therapy: Art Schulenklopper  [facility] Cervical myofascial pain, forward posture contributing to migraines and cervicalgia. Please evaluate and treat including dry needling, stretching, strengthening, manual therapy/massage, heating, TENS unit, exercising for scapular stabilization, pectoral stretching and rhomboid strengthening as clinically warranted as well as any other modality as recommended by evaluation.

## 2017-06-05 NOTE — Progress Notes (Signed)
[facility]    Provider:  Dr Jaynee Eagles Referring Provider: Sinda Du, MD Primary Care Physician:  Sinda Du, MD  CC: Vestibular migraines  Interval history 06/05/2017: She takes Treximet, still helping. Over the last 3 months she has had a few spells of vertigo when changing positions, she had it for about a week and 2 spells.  Other than that no vertigo. She went through menopause. Her migraines are not that often. She has a nagging headache weekly. Her migraines are behind the eyes and in the temples and someimtes in the top, just pain unsure how to describe it, she gets nausea, no vomiting, + light sensitivity, no sound sensitivity. The weekly headache responds to motrin. She takes treximet when she gets dizzy. Last migraine February but since then she has weekly headaches and episodes of dizziness. The weekly headache   Interval History 05/02/2016: No side effects since the medication. She has only had 2 dizzy spells. Occ when she turns to the left in the morning she gets dizzy. She has taken the Treximet twice since going up to 50mg  Trokendi qhs. Her husband is general surgery employed by [employer]. Daughter is 66. She has a daughter who is 75 and a son who is 48. SHe feels great andis very happy. Discussed we will stay at current dose with treximet prn.   MRI of the brain was normal.  Interval History: 51 year old female with vestibular migraines. She tried treximet and it worked. She took it at 7am. It worked and no side effects. She started the Trokendi on Friday. She started at 25mg . Her neck is better. The positional vertigo is better. Since last being seen she has headaches 2-3x a month. She had one bad one as above and Treximet helped. Since February 20th has had the positional vertigo constantly. Few times had the headache mostly behind the eys and in the temple.Vestibular therapy.    HPI: KAZI MONTORO is a 51 y.o. female here as a referral from Dr.  Luan Pulling for headaches. 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 Headaches started in 2010 with nausea and dizziness. Lasted for over a day. Didn't have any again until 2013 but now getting more frequent and more severe. The dizziness occurs with headaches. The headaches are behind the eyes and in the temples and someimtes in the top, just pain unsure how to describe it, she gets nausea, no vomiting, + light sensitivity, no sound sensitivity. She takes excedrin migraine. She takes it sporadically some weeks 3-4x others none. At least 2x a week it gets bad enough to take excedrin. Last month had 15 headaches a month, this month only one. Off balance like on a boat. She has had room spinning occ when laying down but more off balance. No other associated symptoms. No inciting events. No focal neurologic deficits. She provides a headache diary today with details that the dizziness started back in June 2015 but the headaches did not start until August 2016. When the headaches began, the episodes of headache with dizziness increased with the worst month being  Reviewed notes, labs and imaging from outside physicians, which showed;  Reviewed log that patient brought in. Patient is tried Antivert or Valium 2 mg every 6-8 hours for her dizziness. Effexor XR was not effective. First spell was in December 2010 when she was traveling to [facility]. Stopped at [employer] that were set night in  the next morning. Had nausea. No vomiting. Lasted over a week. Had a small spell early in 2013. Second big spell June 2013 at the beach. Got worse that day and the next. Had nausea lasted 3-4 days. The next spell was 08/29/2012 at work. Worse at night in the next morning. She had an upset stomach. Lasted a day.  February 2014: Dizziness for 2 days worse with laying down March 2013: 1 day of dizziness,  upon waking April 2014 slight dizziness on laying down April 2015 slight dizziness one day. Better with caffeine. 02/2014 dizziness for 2 days October 2015: 5 days of dizzy spells and nausea. Onset of headaches. June 2016: 1 episode of dizzy spell and nausea July 2016: She woke up with dizziness that lasted 3 weeks. August 2016: 9 days of headache and dizziness September 2016 3 days of headache and dizziness October 2016: 1 episode of dizziness November 2016: 13 headache days with dizziness. December 2016: 1 episode of headache  [facility] from [facility]. Stated she is having headache and dizziness with increasing frequency. No change in her hearing although she does have occasional clicking in her left ear.  Review of Systems: Patient complains of symptoms per HPI as well as the following symptoms: Eye pain, hearing changes in left ear, headache, dizziness, anxiety. Pertinent negatives per HPI. All others negative.     Social History   Social History  . Marital status: Married    Spouse name: Elta Guadeloupe   . Number of children: 2  . Years of education: 16   Occupational History  . RN    Social History Main Topics  . Smoking status: Never Smoker  . Smokeless tobacco: Never Used  . Alcohol use Yes     Comment: wine socially  . Drug use: No  . Sexual activity: Yes    Birth control/ protection: Other-see comments     Comment: vasectomy   Other Topics Concern  . Not on file   Social History Narrative   Lives at home with husband and children.   Caffeine use: 1-2 cups per day    Family History  Problem Relation Age of Onset  . Heart disease Father        heart attack  . Diabetes Brother   . Migraines Neg Hx     Past Medical History:  Diagnosis Date  . Cancer [facility])    thyroid cancer  . Headache   . History of thyroid cancer 06/28/2013  . Rectocele 06/28/2013  . Thyroid disease    cancer    Past Surgical History:  Procedure Laterality Date  .  THYROIDECTOMY    . TONSILECTOMY, ADENOIDECTOMY, BILATERAL MYRINGOTOMY AND TUBES      Current Outpatient Prescriptions  Medication Sig Dispense Refill  . Ascorbic Acid (VITAMIN C) 1000 MG tablet Take 1,000 mg by mouth daily.    Marland Kitchen aspirin-acetaminophen-caffeine (EXCEDRIN MIGRAINE) 250-250-65 MG tablet Take 1 tablet by mouth every 6 (six) hours as needed for headache. Reported on 12/14/2015    . Calcium-Magnesium-Vitamin D (CALCIUM MAGNESIUM PO) Take 3 tablets by mouth daily.    . fexofenadine (ALLEGRA) 180 MG tablet Take 180 mg by mouth daily.    Marland Kitchen ibuprofen (ADVIL,MOTRIN) 200 MG tablet Take 200 mg by mouth as needed.    . SUMAtriptan-naproxen (TREXIMET) 85-500 MG tablet Take 1 tablet by mouth once. 10 tablet 12  . SYNTHROID 125 MCG tablet Take 125 mcg by mouth daily before breakfast.      No current facility-administered  medications for this visit.     Allergies as of 06/05/2017 - Review Complete 10/21/2016  Allergen Reaction Noted  . Codeine Nausea And Vomiting 06/28/2013  . Levaquin [levofloxacin in d5w] Other (See Comments) 10/05/2015  . Sulfa antibiotics Nausea And Vomiting 06/28/2013    Vitals: There were no vitals taken for this visit. Last Weight:  Wt Readings from Last 1 Encounters:  10/21/16 134 lb 8 oz (61 kg)   Last Height:   Ht Readings from Last 1 Encounters:  10/21/16 5' 5.5" (1.664 m)     Assessment/Plan: 51 year old female migraines and dizziness.   - No acute medications more than 10x a month and nerve blocks as needed  Physical Therapy: Art Schulenklopper   Physical Therapy Cervical myofascial pain, forward posture contributing to migraines and cervicalgia. Please evaluate and treat including dry needling, stretching, strengthening, manual therapy/massage, heating, TENS unit, exercising for scapular stabilization, pectoral stretching and rhomboid strengthening as clinically warranted as well as any other modality as recommended by evaluation.  -  Discussion with patient regarding migraine triggers and behavioral modifications that can help prevent migraines. Provided information sheet with common migraine triggers including foods to avoid.  As far as diagnostic testing: MRi of the brain was normal  Preventative: Discussed Aimovig Acute treatment: Imitrex and naproxyn at onset of headache. May repeat once in 2 hours.   Sarina Ill, MD  [facility] [address] [place], [place] [address]  Phone [phone] Fax [phone]  A total of 15 minutes was spent face-to-face with this patient. Over half this time was spent on counseling patient on the migraine diagnosis and different diagnostic and therapeutic options available.

## 2017-06-06 NOTE — Telephone Encounter (Signed)
Hinton Dyer, just wanted to make sure you got this below let me know thanks

## 2017-06-07 ENCOUNTER — Other Ambulatory Visit: Payer: Self-pay | Admitting: Neurology

## 2017-06-07 DIAGNOSIS — M7918 Myalgia, other site: Secondary | ICD-10-CM

## 2017-06-07 DIAGNOSIS — M542 Cervicalgia: Secondary | ICD-10-CM

## 2017-06-07 DIAGNOSIS — G43009 Migraine without aura, not intractable, without status migrainosus: Secondary | ICD-10-CM

## 2017-06-07 NOTE — Telephone Encounter (Signed)
Noted Dr. Jaynee Eagles will you put in a referral for her and I will send thanks so Much Hinton Dyer.    [facility]  Website  Directions  Save  [phone] Google reviews   Physical therapist in [place], [place]   Address: [address], [place], [place] [address]  Hours:  Open ? Closes 6:30PM                       Phone: [phone]  Appointments: [email or web address]  Suggest an Engineer, agricultural   Physical Therapy   [email or web address], PT, DPT, CMTPT. Licensed. [facility] .Marland KitchenMarland Kitchen

## 2017-06-07 NOTE — Telephone Encounter (Signed)
Gwendolyn Flores, I placed the order. I hope [facility] doesn't call Gwendolyn Flores as well.  Physical Therapy: Art Schulenklopper  [facility] Cervical myofascial pain, forward posture contributing to migraines and cervicalgia. Please evaluate and treat including dry needling, stretching, strengthening, manual therapy/massage, heating, TENS unit, exercising for scapular stabilization, pectoral stretching and rhomboid strengthening as clinically warranted as well as any other modality as recommended by evaluation.

## 2017-06-08 NOTE — Telephone Encounter (Signed)
No [facility]  want call her I made sure . Referral has  Been sent to her requested place below . Thanks Hinton Dyer

## 2017-06-19 DIAGNOSIS — M6281 Muscle weakness (generalized): Secondary | ICD-10-CM | POA: Diagnosis not present

## 2017-06-19 DIAGNOSIS — R293 Abnormal posture: Secondary | ICD-10-CM | POA: Diagnosis not present

## 2017-06-19 DIAGNOSIS — M542 Cervicalgia: Secondary | ICD-10-CM | POA: Diagnosis not present

## 2017-06-21 DIAGNOSIS — R293 Abnormal posture: Secondary | ICD-10-CM | POA: Diagnosis not present

## 2017-06-21 DIAGNOSIS — M542 Cervicalgia: Secondary | ICD-10-CM | POA: Diagnosis not present

## 2017-06-21 DIAGNOSIS — M6281 Muscle weakness (generalized): Secondary | ICD-10-CM | POA: Diagnosis not present

## 2017-06-26 DIAGNOSIS — M6281 Muscle weakness (generalized): Secondary | ICD-10-CM | POA: Diagnosis not present

## 2017-06-26 DIAGNOSIS — R293 Abnormal posture: Secondary | ICD-10-CM | POA: Diagnosis not present

## 2017-06-26 DIAGNOSIS — M542 Cervicalgia: Secondary | ICD-10-CM | POA: Diagnosis not present

## 2017-07-03 MED FILL — SYNTHROID 125 MCG TABLET: 125 | 90 days supply | Qty: 75 | Fill #1

## 2017-07-04 DIAGNOSIS — M6281 Muscle weakness (generalized): Secondary | ICD-10-CM | POA: Diagnosis not present

## 2017-07-04 DIAGNOSIS — M542 Cervicalgia: Secondary | ICD-10-CM | POA: Diagnosis not present

## 2017-07-04 DIAGNOSIS — R293 Abnormal posture: Secondary | ICD-10-CM | POA: Diagnosis not present

## 2017-07-14 DIAGNOSIS — M6281 Muscle weakness (generalized): Secondary | ICD-10-CM | POA: Diagnosis not present

## 2017-07-14 DIAGNOSIS — M542 Cervicalgia: Secondary | ICD-10-CM | POA: Diagnosis not present

## 2017-07-14 DIAGNOSIS — R293 Abnormal posture: Secondary | ICD-10-CM | POA: Diagnosis not present

## 2017-08-04 DIAGNOSIS — C73 Malignant neoplasm of thyroid gland: Secondary | ICD-10-CM | POA: Diagnosis not present

## 2017-08-04 DIAGNOSIS — Z833 Family history of diabetes mellitus: Secondary | ICD-10-CM | POA: Diagnosis not present

## 2017-08-04 DIAGNOSIS — E890 Postprocedural hypothyroidism: Secondary | ICD-10-CM | POA: Diagnosis not present

## 2017-08-11 DIAGNOSIS — Z85850 Personal history of malignant neoplasm of thyroid: Secondary | ICD-10-CM | POA: Diagnosis not present

## 2017-08-11 DIAGNOSIS — Z23 Encounter for immunization: Secondary | ICD-10-CM | POA: Diagnosis not present

## 2017-08-11 DIAGNOSIS — R7301 Impaired fasting glucose: Secondary | ICD-10-CM | POA: Diagnosis not present

## 2017-08-11 DIAGNOSIS — E559 Vitamin D deficiency, unspecified: Secondary | ICD-10-CM | POA: Diagnosis not present

## 2017-08-11 DIAGNOSIS — E890 Postprocedural hypothyroidism: Secondary | ICD-10-CM | POA: Diagnosis not present

## 2017-08-11 DIAGNOSIS — Z833 Family history of diabetes mellitus: Secondary | ICD-10-CM | POA: Diagnosis not present

## 2017-09-25 ENCOUNTER — Other Ambulatory Visit: Payer: Self-pay | Admitting: Obstetrics and Gynecology

## 2017-09-25 DIAGNOSIS — Z1231 Encounter for screening mammogram for malignant neoplasm of breast: Secondary | ICD-10-CM

## 2017-09-25 MED FILL — SYNTHROID 125 MCG TABLET: 125 | 90 days supply | Qty: 75 | Fill #2

## 2017-10-27 DIAGNOSIS — M25551 Pain in right hip: Secondary | ICD-10-CM | POA: Diagnosis not present

## 2017-10-30 ENCOUNTER — Ambulatory Visit (HOSPITAL_COMMUNITY)
Admission: RE | Admit: 2017-10-30 | Discharge: 2017-10-30 | Disposition: A | Discharge status: Home / Self Care | Payer: 59 | Source: Ambulatory Visit | Attending: Obstetrics and Gynecology | Admitting: Obstetrics and Gynecology

## 2017-10-30 DIAGNOSIS — Z1231 Encounter for screening mammogram for malignant neoplasm of breast: Secondary | ICD-10-CM | POA: Insufficient documentation

## 2017-11-13 MED FILL — SUMATRIPTAN SUCC 100 MG TAB: 100 | 30 days supply | Qty: 8 | Fill #1

## 2017-12-08 DIAGNOSIS — J301 Allergic rhinitis due to pollen: Secondary | ICD-10-CM | POA: Diagnosis not present

## 2017-12-08 DIAGNOSIS — G43909 Migraine, unspecified, not intractable, without status migrainosus: Secondary | ICD-10-CM | POA: Diagnosis not present

## 2017-12-08 DIAGNOSIS — Z85850 Personal history of malignant neoplasm of thyroid: Secondary | ICD-10-CM | POA: Diagnosis not present

## 2017-12-08 DIAGNOSIS — K210 Gastro-esophageal reflux disease with esophagitis: Secondary | ICD-10-CM | POA: Diagnosis not present

## 2017-12-18 MED FILL — SYNTHROID 125 MCG TABLET: 125 | 90 days supply | Qty: 75 | Fill #3

## 2018-02-09 DIAGNOSIS — C73 Malignant neoplasm of thyroid gland: Secondary | ICD-10-CM | POA: Diagnosis not present

## 2018-02-09 DIAGNOSIS — E890 Postprocedural hypothyroidism: Secondary | ICD-10-CM | POA: Diagnosis not present

## 2018-02-22 DIAGNOSIS — C73 Malignant neoplasm of thyroid gland: Secondary | ICD-10-CM | POA: Diagnosis not present

## 2018-02-22 DIAGNOSIS — Z833 Family history of diabetes mellitus: Secondary | ICD-10-CM | POA: Diagnosis not present

## 2018-02-22 DIAGNOSIS — E559 Vitamin D deficiency, unspecified: Secondary | ICD-10-CM | POA: Diagnosis not present

## 2018-02-22 DIAGNOSIS — E890 Postprocedural hypothyroidism: Secondary | ICD-10-CM | POA: Diagnosis not present

## 2018-03-19 DIAGNOSIS — H43813 Vitreous degeneration, bilateral: Secondary | ICD-10-CM | POA: Diagnosis not present

## 2018-03-19 DIAGNOSIS — H524 Presbyopia: Secondary | ICD-10-CM | POA: Diagnosis not present

## 2018-03-19 DIAGNOSIS — H52223 Regular astigmatism, bilateral: Secondary | ICD-10-CM | POA: Diagnosis not present

## 2018-03-19 DIAGNOSIS — H5203 Hypermetropia, bilateral: Secondary | ICD-10-CM | POA: Diagnosis not present

## 2018-03-19 MED FILL — SUMATRIPTAN SUCC 100 MG TAB: 100 | 30 days supply | Qty: 8 | Fill #2

## 2018-03-19 MED FILL — SYNTHROID 125 MCG TABLET: 125 | 90 days supply | Qty: 75 | Fill #0

## 2018-04-23 ENCOUNTER — Encounter: Payer: Self-pay | Admitting: Adult Health

## 2018-04-23 ENCOUNTER — Ambulatory Visit (INDEPENDENT_AMBULATORY_CARE_PROVIDER_SITE_OTHER): Payer: 59 | Admitting: Adult Health

## 2018-04-23 VITALS — BP 85/57 | HR 79 | Ht 65.0 in | Wt 140.0 lb

## 2018-04-23 DIAGNOSIS — N816 Rectocele: Secondary | ICD-10-CM

## 2018-04-23 DIAGNOSIS — Z01419 Encounter for gynecological examination (general) (routine) without abnormal findings: Secondary | ICD-10-CM | POA: Diagnosis not present

## 2018-04-23 DIAGNOSIS — N951 Menopausal and female climacteric states: Secondary | ICD-10-CM

## 2018-04-23 DIAGNOSIS — N926 Irregular menstruation, unspecified: Secondary | ICD-10-CM | POA: Insufficient documentation

## 2018-04-23 DIAGNOSIS — Z1211 Encounter for screening for malignant neoplasm of colon: Secondary | ICD-10-CM

## 2018-04-23 DIAGNOSIS — Z1212 Encounter for screening for malignant neoplasm of rectum: Secondary | ICD-10-CM

## 2018-04-23 DIAGNOSIS — Z85850 Personal history of malignant neoplasm of thyroid: Secondary | ICD-10-CM

## 2018-04-23 LAB — HEMOCCULT GUIAC POC 1CARD (OFFICE): Fecal Occult Blood, POC: NEGATIVE

## 2018-04-23 NOTE — Progress Notes (Signed)
Patient ID: Gwendolyn Flores, female   DOB: 1966/08/22, 52 y.o.   MRN: [id number] History of Present Illness: Gwendolyn Flores is a 52 year old white female,marriedm G2P2, 52 y.o. in for a well woman gyn exam,she had normal pap with negative HPV 10/05/15. PCP is Dr Luan Pulling.    Current Medications, Allergies, Past Medical History, Past Surgical History, Family History and Social History were reviewed in [employer] record.     Review of Systems: Patient denies any hearing loss, fatigue, blurred vision, shortness of breath, chest pain, abdominal pain, problems with bowel movements, urination, or intercourse. No joint pain or mood swings. Has migraines at times, period irregular.   Physical Exam:BP (!) 85/57 (BP Location: Left Arm, Patient Position: Sitting, Cuff Size: Normal)   Pulse 79   Ht 5\' 5"  (1.651 m)   Wt 140 lb (63.5 kg)   BMI 23.30 kg/m June 22.2019 was last period and before that it was September 2018.  General:  Well developed, well nourished, no acute distress Skin:  Warm and dry Neck:  Midline trachea,thyroid absent, good ROM, no lymphadenopathy Lungs; Clear to auscultation bilaterally Breast:  No dominant palpable mass, retraction, or nipple discharge Cardiovascular: Regular rate and rhythm Abdomen:  Soft, non tender, no hepatosplenomegaly Pelvic:  External genitalia is normal in appearance, no lesions.  The vagina is normal in appearance. Urethra has no lesions or masses. The cervix is bulbous.  Uterus is felt to be normal size, shape, and contour.  No adnexal masses or tenderness noted.Bladder is non tender, no masses felt. Rectal: Good sphincter tone, no polyps, or hemorrhoids felt.  Hemoccult negative.+rectocele Extremities/musculoskeletal:  No swelling or varicosities noted, no clubbing or cyanosis Psych:  No mood changes, alert and cooperative,seems happy PHQ 2 score 0.  Impression:  1. Well woman exam with routine gynecological exam   2. Screening for colorectal  cancer   3. History of thyroid cancer   4. Irregular periods   5. Perimenopausal   6. Rectocele      Plan: Labs with Dr Altheimer Physical and pap in 1 year Mammogram yearly Colonoscopy at 62 Keep period log

## 2018-06-01 MED FILL — SUMATRIPTAN SUCC 100 MG TAB: 100 | 30 days supply | Qty: 8 | Fill #3

## 2018-06-11 MED FILL — SYNTHROID 125 MCG TABLET: 125 | 90 days supply | Qty: 75 | Fill #1

## 2018-07-09 DIAGNOSIS — L821 Other seborrheic keratosis: Secondary | ICD-10-CM | POA: Diagnosis not present

## 2018-07-09 DIAGNOSIS — D225 Melanocytic nevi of trunk: Secondary | ICD-10-CM | POA: Diagnosis not present

## 2018-07-09 DIAGNOSIS — Z08 Encounter for follow-up examination after completed treatment for malignant neoplasm: Secondary | ICD-10-CM | POA: Diagnosis not present

## 2018-07-09 DIAGNOSIS — L738 Other specified follicular disorders: Secondary | ICD-10-CM | POA: Diagnosis not present

## 2018-07-09 DIAGNOSIS — Z85828 Personal history of other malignant neoplasm of skin: Secondary | ICD-10-CM | POA: Diagnosis not present

## 2018-07-09 DIAGNOSIS — D485 Neoplasm of uncertain behavior of skin: Secondary | ICD-10-CM | POA: Diagnosis not present

## 2018-07-09 MED FILL — CLOBETASOL PROPIONATE 0.05: 0.05 | 15 days supply | Qty: 60 | Fill #0

## 2018-07-23 DIAGNOSIS — L7682 Other postprocedural complications of skin and subcutaneous tissue: Secondary | ICD-10-CM | POA: Diagnosis not present

## 2018-07-23 DIAGNOSIS — D485 Neoplasm of uncertain behavior of skin: Secondary | ICD-10-CM | POA: Diagnosis not present

## 2018-08-02 ENCOUNTER — Other Ambulatory Visit: Payer: Self-pay | Admitting: Neurology

## 2018-08-02 ENCOUNTER — Other Ambulatory Visit: Payer: Self-pay | Admitting: *Deleted

## 2018-08-02 MED ORDER — SUMATRIPTAN SUCCINATE 100 MG PO TABS: ORAL_TABLET | ORAL | 0 refills | Status: DC

## 2018-08-02 MED FILL — SUMAtriptan SUCCINATE 100 M: 100 | 30 days supply | Qty: 9 | Fill #0

## 2018-08-03 ENCOUNTER — Ambulatory Visit (INDEPENDENT_AMBULATORY_CARE_PROVIDER_SITE_OTHER): Payer: 59 | Admitting: Neurology

## 2018-08-03 DIAGNOSIS — G43709 Chronic migraine without aura, not intractable, without status migrainosus: Secondary | ICD-10-CM

## 2018-08-03 NOTE — Progress Notes (Signed)
[facility]    Provider:  Dr Jaynee Eagles Referring Provider: Sinda Du, MD Primary Care Physician:  Sinda Du, MD  CC: Vestibular migraines  Interval history 08/03/2018: Imtrex works well acutely.  Migraines usually last a day and 20 headache days a month and 12 migraine days. Vertigo has improved. Throbbing behind the eyes, +nausea, light and sound sensitivity, a dark room helps. No medication overuse. No aura.   Interval history 06/05/2017: She takes Treximet, still helping. Over the last 3 months she has had a few spells of vertigo when changing positions, she had it for about a week and 2 spells.  Other than that no vertigo. She went through menopause. Her migraines are not that often. She has a nagging headache weekly. Her migraines are behind the eyes and in the temples and someimtes in the top, just pain unsure how to describe it, she gets nausea, no vomiting, + light sensitivity, no sound sensitivity. The weekly headache responds to motrin. She takes treximet when she gets dizzy. Last migraine February but since then she has weekly headaches and episodes of dizziness. The weekly headache   Interval History 05/02/2016: No side effects since the medication. She has only had 2 dizzy spells. Occ when she turns to the left in the morning she gets dizzy. She has taken the Treximet twice since going up to 50mg  Trokendi qhs. Her husband is general surgery employed by [employer]. Daughter is 84. She has a daughter who is 77 and a son who is 52. SHe feels great andis very happy. Discussed we will stay at current dose with treximet prn.   MRI of the brain was normal.  Interval History: 52 year old female with vestibular migraines. She tried treximet and it worked. She took it at 7am. It worked and no side effects. She started the Trokendi on Friday. She started at 25mg . Her neck is better. The positional vertigo is better. Since last being seen she has headaches 2-3x a month. She  had one bad one as above and Treximet helped. Since February 20th has had the positional vertigo constantly. Few times had the headache mostly behind the eys and in the temple.Vestibular therapy.    HPI: Gwendolyn Flores is a 52 y.o. female here as a referral from Dr. Luan Pulling for headaches. 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 Headaches started in 2010 with nausea and dizziness. Lasted for over a day. Didn't have any again until 2013 but now getting more frequent and more severe. The dizziness occurs with headaches. The headaches are behind the eyes and in the temples and someimtes in the top, just pain unsure how to describe it, she gets nausea, no vomiting, + light sensitivity, no sound sensitivity. She takes excedrin migraine. She takes it sporadically some weeks 3-4x others none. At least 2x a week it gets bad enough to take excedrin. Last month had 15 headaches a month, this month only one. Off balance like on a boat. She has had room spinning occ when laying down but more off balance. No other associated symptoms. No inciting events. No focal neurologic deficits. She provides a headache diary today with details that the dizziness started back in June 2015 but the headaches did not start until August 2016. When the headaches began, the episodes of headache with dizziness increased with the worst month being  Reviewed notes, labs and imaging from outside physicians, which showed;  Reviewed  log that patient brought in. Patient is tried Antivert or Valium 2 mg every 6-8 hours for her dizziness. Effexor XR was not effective. First spell was in December 2010 when she was traveling to [facility]. Stopped at [employer] that were set night in the next morning. Had nausea. No vomiting. Lasted over a week. Had a small spell early in 2013. Second big spell June 2013 at the  beach. Got worse that day and the next. Had nausea lasted 3-4 days. The next spell was 08/29/2012 at work. Worse at night in the next morning. She had an upset stomach. Lasted a day.  February 2014: Dizziness for 2 days worse with laying down March 2013: 1 day of dizziness, upon waking April 2014 slight dizziness on laying down April 2015 slight dizziness one day. Better with caffeine. 02/2014 dizziness for 2 days October 2015: 5 days of dizzy spells and nausea. Onset of headaches. June 2016: 1 episode of dizzy spell and nausea July 2016: She woke up with dizziness that lasted 3 weeks. August 2016: 9 days of headache and dizziness September 2016 3 days of headache and dizziness October 2016: 1 episode of dizziness November 2016: 13 headache days with dizziness. December 2016: 1 episode of headache  [facility]ed [facility] from [facility]. Stated she is having headache and dizziness with increasing frequency. No change in her hearing although she does have occasional clicking in her left ear.  Review of Systems: Patient complains of symptoms per HPI as well as the following symptoms: Eye pain, hearing changes in left ear, headache, dizziness, anxiety. Pertinent negatives per HPI. All others negative.     Social History   Socioeconomic History  . Marital status: Married    Spouse name: Elta Guadeloupe   . Number of children: 2  . Years of education: 43  . Highest education level: Not on file  Occupational History  . Occupation: Therapist, sports  Social Needs  . Financial resource strain: Not on file  . Food insecurity:    Worry: Not on file    Inability: Not on file  . Transportation needs:    Medical: Not on file    Non-medical: Not on file  Tobacco Use  . Smoking status: Never Smoker  . Smokeless tobacco: Never Used  Substance and Sexual Activity  . Alcohol use: Yes    Comment: wine socially  . Drug use: No  . Sexual activity: Yes    Birth control/protection: Other-see comments    Comment:  vasectomy  Lifestyle  . Physical activity:    Days per week: Not on file    Minutes per session: Not on file  . Stress: Not on file  Relationships  . Social connections:    Talks on phone: Not on file    Gets together: Not on file    Attends religious service: Not on file    Active member of club or organization: Not on file    Attends meetings of clubs or organizations: Not on file    Relationship status: Not on file  . Intimate partner violence:    Fear of current or ex partner: Not on file    Emotionally abused: Not on file    Physically abused: Not on file    Forced sexual activity: Not on file  Other Topics Concern  . Not on file  Social History Narrative   Lives at home with husband and children.   Caffeine use: 1-2 cups per day    Family History  Problem  Relation Age of Onset  . Heart disease Father        heart attack  . Diabetes Brother   . Migraines Neg Hx     Past Medical History:  Diagnosis Date  . Cancer [facility])    thyroid cancer  . Headache   . History of thyroid cancer 06/28/2013  . Rectocele 06/28/2013  . Thyroid disease    cancer    Past Surgical History:  Procedure Laterality Date  . THYROIDECTOMY    . TONSILECTOMY, ADENOIDECTOMY, BILATERAL MYRINGOTOMY AND TUBES      Current Outpatient Medications  Medication Sig Dispense Refill  . Ascorbic Acid (VITAMIN C) 1000 MG tablet Take 1,000 mg by mouth daily.    Marland Kitchen aspirin-acetaminophen-caffeine (EXCEDRIN MIGRAINE) 250-250-65 MG tablet Take 1 tablet by mouth every 6 (six) hours as needed for headache. Reported on 12/14/2015    . Calcium-Magnesium-Vitamin D (CALCIUM MAGNESIUM PO) Take 3 tablets by mouth daily.    . fexofenadine (ALLEGRA) 180 MG tablet Take 180 mg by mouth daily.    Marland Kitchen ibuprofen (ADVIL,MOTRIN) 200 MG tablet Take 200 mg by mouth as needed.    . naproxen (NAPROSYN) 500 MG tablet Take with Imitrex for migraine or vertigo. (Patient not taking: Reported on 04/23/2018) 60 tablet 12  . SUMAtriptan  (IMITREX) 100 MG tablet TAKE 1 TABLET BY MOUTH ONCE AS NEEDED. MAY REPEAT IN 2 HOURS IF HEADACHE PERSISTS OR RECURS. MAX 2 TAB/DAY. NEED YEARLY FOLLOW-UP. 10 tablet 0  . SUMAtriptan-naproxen (TREXIMET) 85-500 MG tablet Take 1 tablet by mouth once. 10 tablet 12  . SYNTHROID 125 MCG tablet Take 125 mcg by mouth daily before breakfast.      No current facility-administered medications for this visit.     Allergies as of 08/03/2018 - Review Complete 04/23/2018  Allergen Reaction Noted  . Codeine Nausea And Vomiting 06/28/2013  . Levaquin [levofloxacin in d5w] Other (See Comments) 10/05/2015  . Sulfa antibiotics Nausea And Vomiting 06/28/2013    Vitals: There were no vitals taken for this visit. Last Weight:  Wt Readings from Last 1 Encounters:  04/23/18 140 lb (63.5 kg)   Last Height:   Ht Readings from Last 1 Encounters:  04/23/18 5\' 5"  (1.651 m)     Assessment/Plan: 52 year old female migraines.  - Discussion with patient regarding migraine triggers and behavioral modifications that can help prevent migraines. Provided information sheet with common migraine triggers including foods to avoid.  As far as diagnostic testing: MRi of the brain was normal  Preventative: Start Aimovig Acute treatment: Imitrex and naproxyn at onset of headache. May repeat once in 2 hours.   Sarina Ill, MD  [facility] [address] [place], [place] [address]  Phone [phone] Fax [phone]  A total of 15 minutes was spent face-to-face with this patient. Over half this time was spent on counseling patient on the migraine diagnosis and different diagnostic and therapeutic options available.

## 2018-08-06 DIAGNOSIS — E890 Postprocedural hypothyroidism: Secondary | ICD-10-CM | POA: Diagnosis not present

## 2018-08-06 DIAGNOSIS — C73 Malignant neoplasm of thyroid gland: Secondary | ICD-10-CM | POA: Diagnosis not present

## 2018-08-06 DIAGNOSIS — Z833 Family history of diabetes mellitus: Secondary | ICD-10-CM | POA: Diagnosis not present

## 2018-08-10 DIAGNOSIS — M6281 Muscle weakness (generalized): Secondary | ICD-10-CM | POA: Diagnosis not present

## 2018-08-10 DIAGNOSIS — M542 Cervicalgia: Secondary | ICD-10-CM | POA: Diagnosis not present

## 2018-08-10 DIAGNOSIS — R293 Abnormal posture: Secondary | ICD-10-CM | POA: Diagnosis not present

## 2018-08-20 DIAGNOSIS — E559 Vitamin D deficiency, unspecified: Secondary | ICD-10-CM | POA: Diagnosis not present

## 2018-08-20 DIAGNOSIS — Z23 Encounter for immunization: Secondary | ICD-10-CM | POA: Diagnosis not present

## 2018-08-20 DIAGNOSIS — E890 Postprocedural hypothyroidism: Secondary | ICD-10-CM | POA: Diagnosis not present

## 2018-08-20 DIAGNOSIS — Z833 Family history of diabetes mellitus: Secondary | ICD-10-CM | POA: Diagnosis not present

## 2018-08-20 DIAGNOSIS — C73 Malignant neoplasm of thyroid gland: Secondary | ICD-10-CM | POA: Diagnosis not present

## 2018-08-20 MED FILL — SYNTHROID 100 MCG TABLET: 100 | 90 days supply | Qty: 90 | Fill #0

## 2018-08-31 DIAGNOSIS — M9901 Segmental and somatic dysfunction of cervical region: Secondary | ICD-10-CM | POA: Diagnosis not present

## 2018-08-31 DIAGNOSIS — M9903 Segmental and somatic dysfunction of lumbar region: Secondary | ICD-10-CM | POA: Diagnosis not present

## 2018-08-31 DIAGNOSIS — M546 Pain in thoracic spine: Secondary | ICD-10-CM | POA: Diagnosis not present

## 2018-08-31 DIAGNOSIS — M9902 Segmental and somatic dysfunction of thoracic region: Secondary | ICD-10-CM | POA: Diagnosis not present

## 2018-08-31 DIAGNOSIS — M542 Cervicalgia: Secondary | ICD-10-CM | POA: Diagnosis not present

## 2018-08-31 DIAGNOSIS — M9905 Segmental and somatic dysfunction of pelvic region: Secondary | ICD-10-CM | POA: Diagnosis not present

## 2018-08-31 DIAGNOSIS — M545 Low back pain: Secondary | ICD-10-CM | POA: Diagnosis not present

## 2018-08-31 DIAGNOSIS — M9904 Segmental and somatic dysfunction of sacral region: Secondary | ICD-10-CM | POA: Diagnosis not present

## 2018-09-03 DIAGNOSIS — M9903 Segmental and somatic dysfunction of lumbar region: Secondary | ICD-10-CM | POA: Diagnosis not present

## 2018-09-03 DIAGNOSIS — M9902 Segmental and somatic dysfunction of thoracic region: Secondary | ICD-10-CM | POA: Diagnosis not present

## 2018-09-03 DIAGNOSIS — M9901 Segmental and somatic dysfunction of cervical region: Secondary | ICD-10-CM | POA: Diagnosis not present

## 2018-09-03 DIAGNOSIS — M9905 Segmental and somatic dysfunction of pelvic region: Secondary | ICD-10-CM | POA: Diagnosis not present

## 2018-09-03 DIAGNOSIS — M546 Pain in thoracic spine: Secondary | ICD-10-CM | POA: Diagnosis not present

## 2018-09-03 DIAGNOSIS — M542 Cervicalgia: Secondary | ICD-10-CM | POA: Diagnosis not present

## 2018-09-03 DIAGNOSIS — M545 Low back pain: Secondary | ICD-10-CM | POA: Diagnosis not present

## 2018-09-03 DIAGNOSIS — M9904 Segmental and somatic dysfunction of sacral region: Secondary | ICD-10-CM | POA: Diagnosis not present

## 2018-09-05 DIAGNOSIS — M9905 Segmental and somatic dysfunction of pelvic region: Secondary | ICD-10-CM | POA: Diagnosis not present

## 2018-09-05 DIAGNOSIS — M9901 Segmental and somatic dysfunction of cervical region: Secondary | ICD-10-CM | POA: Diagnosis not present

## 2018-09-05 DIAGNOSIS — M545 Low back pain: Secondary | ICD-10-CM | POA: Diagnosis not present

## 2018-09-05 DIAGNOSIS — M542 Cervicalgia: Secondary | ICD-10-CM | POA: Diagnosis not present

## 2018-09-05 DIAGNOSIS — M9903 Segmental and somatic dysfunction of lumbar region: Secondary | ICD-10-CM | POA: Diagnosis not present

## 2018-09-05 DIAGNOSIS — M9904 Segmental and somatic dysfunction of sacral region: Secondary | ICD-10-CM | POA: Diagnosis not present

## 2018-09-05 DIAGNOSIS — M9902 Segmental and somatic dysfunction of thoracic region: Secondary | ICD-10-CM | POA: Diagnosis not present

## 2018-09-05 DIAGNOSIS — M546 Pain in thoracic spine: Secondary | ICD-10-CM | POA: Diagnosis not present

## 2018-09-11 DIAGNOSIS — M9903 Segmental and somatic dysfunction of lumbar region: Secondary | ICD-10-CM | POA: Diagnosis not present

## 2018-09-11 DIAGNOSIS — M9901 Segmental and somatic dysfunction of cervical region: Secondary | ICD-10-CM | POA: Diagnosis not present

## 2018-09-11 DIAGNOSIS — M542 Cervicalgia: Secondary | ICD-10-CM | POA: Diagnosis not present

## 2018-09-11 DIAGNOSIS — M546 Pain in thoracic spine: Secondary | ICD-10-CM | POA: Diagnosis not present

## 2018-09-11 DIAGNOSIS — M9902 Segmental and somatic dysfunction of thoracic region: Secondary | ICD-10-CM | POA: Diagnosis not present

## 2018-09-11 DIAGNOSIS — M545 Low back pain: Secondary | ICD-10-CM | POA: Diagnosis not present

## 2018-09-11 DIAGNOSIS — M9905 Segmental and somatic dysfunction of pelvic region: Secondary | ICD-10-CM | POA: Diagnosis not present

## 2018-09-11 DIAGNOSIS — M9904 Segmental and somatic dysfunction of sacral region: Secondary | ICD-10-CM | POA: Diagnosis not present

## 2018-09-14 DIAGNOSIS — M545 Low back pain: Secondary | ICD-10-CM | POA: Diagnosis not present

## 2018-09-14 DIAGNOSIS — M9901 Segmental and somatic dysfunction of cervical region: Secondary | ICD-10-CM | POA: Diagnosis not present

## 2018-09-14 DIAGNOSIS — M9905 Segmental and somatic dysfunction of pelvic region: Secondary | ICD-10-CM | POA: Diagnosis not present

## 2018-09-14 DIAGNOSIS — M9904 Segmental and somatic dysfunction of sacral region: Secondary | ICD-10-CM | POA: Diagnosis not present

## 2018-09-14 DIAGNOSIS — M546 Pain in thoracic spine: Secondary | ICD-10-CM | POA: Diagnosis not present

## 2018-09-14 DIAGNOSIS — M542 Cervicalgia: Secondary | ICD-10-CM | POA: Diagnosis not present

## 2018-09-14 DIAGNOSIS — M9902 Segmental and somatic dysfunction of thoracic region: Secondary | ICD-10-CM | POA: Diagnosis not present

## 2018-09-14 DIAGNOSIS — M9903 Segmental and somatic dysfunction of lumbar region: Secondary | ICD-10-CM | POA: Diagnosis not present

## 2018-10-03 DIAGNOSIS — M542 Cervicalgia: Secondary | ICD-10-CM | POA: Diagnosis not present

## 2018-10-03 DIAGNOSIS — M9902 Segmental and somatic dysfunction of thoracic region: Secondary | ICD-10-CM | POA: Diagnosis not present

## 2018-10-03 DIAGNOSIS — M9901 Segmental and somatic dysfunction of cervical region: Secondary | ICD-10-CM | POA: Diagnosis not present

## 2018-10-03 DIAGNOSIS — M545 Low back pain: Secondary | ICD-10-CM | POA: Diagnosis not present

## 2018-10-03 DIAGNOSIS — M546 Pain in thoracic spine: Secondary | ICD-10-CM | POA: Diagnosis not present

## 2018-10-03 DIAGNOSIS — M9903 Segmental and somatic dysfunction of lumbar region: Secondary | ICD-10-CM | POA: Diagnosis not present

## 2018-10-03 DIAGNOSIS — M9905 Segmental and somatic dysfunction of pelvic region: Secondary | ICD-10-CM | POA: Diagnosis not present

## 2018-10-03 DIAGNOSIS — M9904 Segmental and somatic dysfunction of sacral region: Secondary | ICD-10-CM | POA: Diagnosis not present

## 2018-10-08 ENCOUNTER — Other Ambulatory Visit (HOSPITAL_COMMUNITY): Payer: Self-pay | Admitting: Obstetrics and Gynecology

## 2018-10-08 DIAGNOSIS — Z1231 Encounter for screening mammogram for malignant neoplasm of breast: Secondary | ICD-10-CM

## 2018-10-15 ENCOUNTER — Ambulatory Visit: Payer: 59 | Admitting: Neurology

## 2018-10-15 DIAGNOSIS — M545 Low back pain: Secondary | ICD-10-CM | POA: Diagnosis not present

## 2018-10-15 DIAGNOSIS — M546 Pain in thoracic spine: Secondary | ICD-10-CM | POA: Diagnosis not present

## 2018-10-15 DIAGNOSIS — M542 Cervicalgia: Secondary | ICD-10-CM | POA: Diagnosis not present

## 2018-10-15 DIAGNOSIS — M9904 Segmental and somatic dysfunction of sacral region: Secondary | ICD-10-CM | POA: Diagnosis not present

## 2018-10-15 DIAGNOSIS — M9902 Segmental and somatic dysfunction of thoracic region: Secondary | ICD-10-CM | POA: Diagnosis not present

## 2018-10-15 DIAGNOSIS — M9901 Segmental and somatic dysfunction of cervical region: Secondary | ICD-10-CM | POA: Diagnosis not present

## 2018-10-15 DIAGNOSIS — M9905 Segmental and somatic dysfunction of pelvic region: Secondary | ICD-10-CM | POA: Diagnosis not present

## 2018-10-15 DIAGNOSIS — M9903 Segmental and somatic dysfunction of lumbar region: Secondary | ICD-10-CM | POA: Diagnosis not present

## 2018-10-24 DIAGNOSIS — K5900 Constipation, unspecified: Secondary | ICD-10-CM | POA: Diagnosis not present

## 2018-10-24 DIAGNOSIS — Z1211 Encounter for screening for malignant neoplasm of colon: Secondary | ICD-10-CM | POA: Diagnosis not present

## 2018-10-25 MED FILL — SUPREP BOWEL PREP KIT: 17.5-3.13-1 | 1 days supply | Qty: 354 | Fill #0

## 2018-10-29 DIAGNOSIS — M546 Pain in thoracic spine: Secondary | ICD-10-CM | POA: Diagnosis not present

## 2018-10-29 DIAGNOSIS — M9901 Segmental and somatic dysfunction of cervical region: Secondary | ICD-10-CM | POA: Diagnosis not present

## 2018-10-29 DIAGNOSIS — M545 Low back pain: Secondary | ICD-10-CM | POA: Diagnosis not present

## 2018-10-29 DIAGNOSIS — M9904 Segmental and somatic dysfunction of sacral region: Secondary | ICD-10-CM | POA: Diagnosis not present

## 2018-10-29 DIAGNOSIS — M9902 Segmental and somatic dysfunction of thoracic region: Secondary | ICD-10-CM | POA: Diagnosis not present

## 2018-10-29 DIAGNOSIS — M9905 Segmental and somatic dysfunction of pelvic region: Secondary | ICD-10-CM | POA: Diagnosis not present

## 2018-10-29 DIAGNOSIS — M542 Cervicalgia: Secondary | ICD-10-CM | POA: Diagnosis not present

## 2018-10-29 DIAGNOSIS — M9903 Segmental and somatic dysfunction of lumbar region: Secondary | ICD-10-CM | POA: Diagnosis not present

## 2018-11-02 ENCOUNTER — Ambulatory Visit (HOSPITAL_COMMUNITY)
Admission: RE | Admit: 2018-11-02 | Discharge: 2018-11-02 | Disposition: A | Discharge status: Home / Self Care | Payer: Commercial Managed Care - PPO | Source: Ambulatory Visit | Attending: Obstetrics and Gynecology | Admitting: Obstetrics and Gynecology

## 2018-11-02 DIAGNOSIS — Z1231 Encounter for screening mammogram for malignant neoplasm of breast: Secondary | ICD-10-CM | POA: Insufficient documentation

## 2018-11-12 DIAGNOSIS — Z1211 Encounter for screening for malignant neoplasm of colon: Secondary | ICD-10-CM | POA: Diagnosis not present

## 2018-11-12 DIAGNOSIS — D123 Benign neoplasm of transverse colon: Secondary | ICD-10-CM | POA: Diagnosis not present

## 2018-11-12 DIAGNOSIS — K635 Polyp of colon: Secondary | ICD-10-CM | POA: Diagnosis not present

## 2018-11-12 MED FILL — SYNTHROID 100 MCG TABLET: 100 | 90 days supply | Qty: 90 | Fill #1 | Status: TO

## 2018-11-13 ENCOUNTER — Encounter: Payer: Self-pay | Admitting: Neurology

## 2018-11-19 DIAGNOSIS — M546 Pain in thoracic spine: Secondary | ICD-10-CM | POA: Diagnosis not present

## 2018-11-19 DIAGNOSIS — M9901 Segmental and somatic dysfunction of cervical region: Secondary | ICD-10-CM | POA: Diagnosis not present

## 2018-11-19 DIAGNOSIS — M542 Cervicalgia: Secondary | ICD-10-CM | POA: Diagnosis not present

## 2018-11-19 DIAGNOSIS — M9902 Segmental and somatic dysfunction of thoracic region: Secondary | ICD-10-CM | POA: Diagnosis not present

## 2018-11-19 DIAGNOSIS — M9904 Segmental and somatic dysfunction of sacral region: Secondary | ICD-10-CM | POA: Diagnosis not present

## 2018-11-19 DIAGNOSIS — M545 Low back pain: Secondary | ICD-10-CM | POA: Diagnosis not present

## 2018-11-19 DIAGNOSIS — M9905 Segmental and somatic dysfunction of pelvic region: Secondary | ICD-10-CM | POA: Diagnosis not present

## 2018-11-19 DIAGNOSIS — M9903 Segmental and somatic dysfunction of lumbar region: Secondary | ICD-10-CM | POA: Diagnosis not present

## 2018-12-07 DIAGNOSIS — R293 Abnormal posture: Secondary | ICD-10-CM | POA: Diagnosis not present

## 2018-12-07 DIAGNOSIS — M6281 Muscle weakness (generalized): Secondary | ICD-10-CM | POA: Diagnosis not present

## 2018-12-07 DIAGNOSIS — M542 Cervicalgia: Secondary | ICD-10-CM | POA: Diagnosis not present

## 2018-12-10 ENCOUNTER — Ambulatory Visit: Payer: 59 | Admitting: Neurology

## 2018-12-10 DIAGNOSIS — D485 Neoplasm of uncertain behavior of skin: Secondary | ICD-10-CM | POA: Diagnosis not present

## 2018-12-14 ENCOUNTER — Other Ambulatory Visit: Payer: Self-pay | Admitting: Neurology

## 2018-12-14 DIAGNOSIS — M9903 Segmental and somatic dysfunction of lumbar region: Secondary | ICD-10-CM | POA: Diagnosis not present

## 2018-12-14 DIAGNOSIS — M542 Cervicalgia: Secondary | ICD-10-CM | POA: Diagnosis not present

## 2018-12-14 DIAGNOSIS — M9905 Segmental and somatic dysfunction of pelvic region: Secondary | ICD-10-CM | POA: Diagnosis not present

## 2018-12-14 DIAGNOSIS — Z0000 Encounter for general adult medical examination without abnormal findings: Secondary | ICD-10-CM | POA: Diagnosis not present

## 2018-12-14 DIAGNOSIS — M9904 Segmental and somatic dysfunction of sacral region: Secondary | ICD-10-CM | POA: Diagnosis not present

## 2018-12-14 DIAGNOSIS — M545 Low back pain: Secondary | ICD-10-CM | POA: Diagnosis not present

## 2018-12-14 DIAGNOSIS — M546 Pain in thoracic spine: Secondary | ICD-10-CM | POA: Diagnosis not present

## 2018-12-14 DIAGNOSIS — M9901 Segmental and somatic dysfunction of cervical region: Secondary | ICD-10-CM | POA: Diagnosis not present

## 2018-12-14 DIAGNOSIS — M9902 Segmental and somatic dysfunction of thoracic region: Secondary | ICD-10-CM | POA: Diagnosis not present

## 2018-12-14 MED ORDER — SUMATRIPTAN SUCCINATE 100 MG PO TABS: ORAL_TABLET | ORAL | 0 refills | Status: DC

## 2018-12-14 MED FILL — SYNTHROID 125 MCG TABLET: 125 | 90 days supply | Qty: 75 | Fill #2

## 2018-12-14 MED FILL — SUMAtriptan SUCCINATE 100 M: 100 | 30 days supply | Qty: 9 | Fill #0

## 2018-12-20 DIAGNOSIS — Z85850 Personal history of malignant neoplasm of thyroid: Secondary | ICD-10-CM | POA: Diagnosis not present

## 2018-12-20 DIAGNOSIS — E039 Hypothyroidism, unspecified: Secondary | ICD-10-CM | POA: Diagnosis not present

## 2018-12-20 DIAGNOSIS — G43909 Migraine, unspecified, not intractable, without status migrainosus: Secondary | ICD-10-CM | POA: Diagnosis not present

## 2018-12-20 DIAGNOSIS — Z0000 Encounter for general adult medical examination without abnormal findings: Secondary | ICD-10-CM | POA: Diagnosis not present

## 2018-12-20 DIAGNOSIS — J312 Chronic pharyngitis: Secondary | ICD-10-CM | POA: Diagnosis not present

## 2018-12-20 DIAGNOSIS — K219 Gastro-esophageal reflux disease without esophagitis: Secondary | ICD-10-CM | POA: Diagnosis not present

## 2019-01-07 MED FILL — SYNTHROID 100 MCG TABLET: 100 | 90 days supply | Qty: 90 | Fill #0

## 2019-01-14 DIAGNOSIS — M9904 Segmental and somatic dysfunction of sacral region: Secondary | ICD-10-CM | POA: Diagnosis not present

## 2019-01-14 DIAGNOSIS — M542 Cervicalgia: Secondary | ICD-10-CM | POA: Diagnosis not present

## 2019-01-14 DIAGNOSIS — M545 Low back pain: Secondary | ICD-10-CM | POA: Diagnosis not present

## 2019-01-14 DIAGNOSIS — M546 Pain in thoracic spine: Secondary | ICD-10-CM | POA: Diagnosis not present

## 2019-01-14 DIAGNOSIS — M9902 Segmental and somatic dysfunction of thoracic region: Secondary | ICD-10-CM | POA: Diagnosis not present

## 2019-01-14 DIAGNOSIS — M9901 Segmental and somatic dysfunction of cervical region: Secondary | ICD-10-CM | POA: Diagnosis not present

## 2019-01-14 DIAGNOSIS — M9903 Segmental and somatic dysfunction of lumbar region: Secondary | ICD-10-CM | POA: Diagnosis not present

## 2019-01-14 DIAGNOSIS — M9905 Segmental and somatic dysfunction of pelvic region: Secondary | ICD-10-CM | POA: Diagnosis not present

## 2019-01-21 ENCOUNTER — Ambulatory Visit: Payer: 59 | Admitting: Neurology

## 2019-02-04 DIAGNOSIS — E890 Postprocedural hypothyroidism: Secondary | ICD-10-CM | POA: Diagnosis not present

## 2019-02-04 DIAGNOSIS — C73 Malignant neoplasm of thyroid gland: Secondary | ICD-10-CM | POA: Diagnosis not present

## 2019-02-04 DIAGNOSIS — Z833 Family history of diabetes mellitus: Secondary | ICD-10-CM | POA: Diagnosis not present

## 2019-02-11 DIAGNOSIS — Z85850 Personal history of malignant neoplasm of thyroid: Secondary | ICD-10-CM | POA: Diagnosis not present

## 2019-02-11 DIAGNOSIS — E559 Vitamin D deficiency, unspecified: Secondary | ICD-10-CM | POA: Diagnosis not present

## 2019-02-11 DIAGNOSIS — E890 Postprocedural hypothyroidism: Secondary | ICD-10-CM | POA: Diagnosis not present

## 2019-02-11 DIAGNOSIS — Z833 Family history of diabetes mellitus: Secondary | ICD-10-CM | POA: Diagnosis not present

## 2019-02-15 DIAGNOSIS — H2513 Age-related nuclear cataract, bilateral: Secondary | ICD-10-CM | POA: Diagnosis not present

## 2019-02-15 DIAGNOSIS — H43393 Other vitreous opacities, bilateral: Secondary | ICD-10-CM | POA: Diagnosis not present

## 2019-02-15 DIAGNOSIS — M9904 Segmental and somatic dysfunction of sacral region: Secondary | ICD-10-CM | POA: Diagnosis not present

## 2019-02-15 DIAGNOSIS — H43812 Vitreous degeneration, left eye: Secondary | ICD-10-CM | POA: Diagnosis not present

## 2019-02-15 DIAGNOSIS — M9905 Segmental and somatic dysfunction of pelvic region: Secondary | ICD-10-CM | POA: Diagnosis not present

## 2019-02-15 DIAGNOSIS — M9903 Segmental and somatic dysfunction of lumbar region: Secondary | ICD-10-CM | POA: Diagnosis not present

## 2019-02-15 DIAGNOSIS — M9902 Segmental and somatic dysfunction of thoracic region: Secondary | ICD-10-CM | POA: Diagnosis not present

## 2019-02-15 DIAGNOSIS — M545 Low back pain: Secondary | ICD-10-CM | POA: Diagnosis not present

## 2019-02-15 DIAGNOSIS — M542 Cervicalgia: Secondary | ICD-10-CM | POA: Diagnosis not present

## 2019-02-15 DIAGNOSIS — M9901 Segmental and somatic dysfunction of cervical region: Secondary | ICD-10-CM | POA: Diagnosis not present

## 2019-02-15 DIAGNOSIS — M546 Pain in thoracic spine: Secondary | ICD-10-CM | POA: Diagnosis not present

## 2019-03-21 DIAGNOSIS — M9904 Segmental and somatic dysfunction of sacral region: Secondary | ICD-10-CM | POA: Diagnosis not present

## 2019-03-21 DIAGNOSIS — M9903 Segmental and somatic dysfunction of lumbar region: Secondary | ICD-10-CM | POA: Diagnosis not present

## 2019-03-21 DIAGNOSIS — M546 Pain in thoracic spine: Secondary | ICD-10-CM | POA: Diagnosis not present

## 2019-03-21 DIAGNOSIS — M545 Low back pain: Secondary | ICD-10-CM | POA: Diagnosis not present

## 2019-03-21 DIAGNOSIS — M9901 Segmental and somatic dysfunction of cervical region: Secondary | ICD-10-CM | POA: Diagnosis not present

## 2019-03-21 DIAGNOSIS — M542 Cervicalgia: Secondary | ICD-10-CM | POA: Diagnosis not present

## 2019-03-21 DIAGNOSIS — M9902 Segmental and somatic dysfunction of thoracic region: Secondary | ICD-10-CM | POA: Diagnosis not present

## 2019-03-21 DIAGNOSIS — M9905 Segmental and somatic dysfunction of pelvic region: Secondary | ICD-10-CM | POA: Diagnosis not present

## 2019-03-22 DIAGNOSIS — H524 Presbyopia: Secondary | ICD-10-CM | POA: Diagnosis not present

## 2019-03-22 DIAGNOSIS — H52223 Regular astigmatism, bilateral: Secondary | ICD-10-CM | POA: Diagnosis not present

## 2019-03-22 DIAGNOSIS — H5203 Hypermetropia, bilateral: Secondary | ICD-10-CM | POA: Diagnosis not present

## 2019-04-02 DIAGNOSIS — H43812 Vitreous degeneration, left eye: Secondary | ICD-10-CM | POA: Diagnosis not present

## 2019-04-02 DIAGNOSIS — H43392 Other vitreous opacities, left eye: Secondary | ICD-10-CM | POA: Diagnosis not present

## 2019-04-25 DIAGNOSIS — M542 Cervicalgia: Secondary | ICD-10-CM | POA: Diagnosis not present

## 2019-04-25 DIAGNOSIS — M9901 Segmental and somatic dysfunction of cervical region: Secondary | ICD-10-CM | POA: Diagnosis not present

## 2019-04-25 DIAGNOSIS — M9903 Segmental and somatic dysfunction of lumbar region: Secondary | ICD-10-CM | POA: Diagnosis not present

## 2019-04-25 DIAGNOSIS — M9902 Segmental and somatic dysfunction of thoracic region: Secondary | ICD-10-CM | POA: Diagnosis not present

## 2019-04-25 DIAGNOSIS — M9904 Segmental and somatic dysfunction of sacral region: Secondary | ICD-10-CM | POA: Diagnosis not present

## 2019-04-25 DIAGNOSIS — M545 Low back pain: Secondary | ICD-10-CM | POA: Diagnosis not present

## 2019-04-25 DIAGNOSIS — M546 Pain in thoracic spine: Secondary | ICD-10-CM | POA: Diagnosis not present

## 2019-04-25 DIAGNOSIS — M9905 Segmental and somatic dysfunction of pelvic region: Secondary | ICD-10-CM | POA: Diagnosis not present

## 2019-05-17 MED FILL — SYNTHROID 100 MCG TABLET: 100 | 90 days supply | Qty: 90 | Fill #0

## 2019-06-06 DIAGNOSIS — M545 Low back pain: Secondary | ICD-10-CM | POA: Diagnosis not present

## 2019-06-06 DIAGNOSIS — M9904 Segmental and somatic dysfunction of sacral region: Secondary | ICD-10-CM | POA: Diagnosis not present

## 2019-06-06 DIAGNOSIS — M542 Cervicalgia: Secondary | ICD-10-CM | POA: Diagnosis not present

## 2019-06-06 DIAGNOSIS — M9905 Segmental and somatic dysfunction of pelvic region: Secondary | ICD-10-CM | POA: Diagnosis not present

## 2019-06-06 DIAGNOSIS — M9903 Segmental and somatic dysfunction of lumbar region: Secondary | ICD-10-CM | POA: Diagnosis not present

## 2019-06-06 DIAGNOSIS — M546 Pain in thoracic spine: Secondary | ICD-10-CM | POA: Diagnosis not present

## 2019-06-06 DIAGNOSIS — M9902 Segmental and somatic dysfunction of thoracic region: Secondary | ICD-10-CM | POA: Diagnosis not present

## 2019-06-06 DIAGNOSIS — M9901 Segmental and somatic dysfunction of cervical region: Secondary | ICD-10-CM | POA: Diagnosis not present

## 2019-06-10 DIAGNOSIS — D225 Melanocytic nevi of trunk: Secondary | ICD-10-CM | POA: Diagnosis not present

## 2019-06-10 DIAGNOSIS — Z1283 Encounter for screening for malignant neoplasm of skin: Secondary | ICD-10-CM | POA: Diagnosis not present

## 2019-07-05 ENCOUNTER — Other Ambulatory Visit: Payer: Self-pay

## 2019-07-05 ENCOUNTER — Ambulatory Visit (INDEPENDENT_AMBULATORY_CARE_PROVIDER_SITE_OTHER): Payer: 59 | Admitting: Internal Medicine

## 2019-07-05 ENCOUNTER — Encounter: Payer: Self-pay | Admitting: Internal Medicine

## 2019-07-05 VITALS — BP 108/60 | HR 85 | Temp 97.2°F | Wt 142.6 lb

## 2019-07-05 DIAGNOSIS — Z85850 Personal history of malignant neoplasm of thyroid: Secondary | ICD-10-CM

## 2019-07-05 DIAGNOSIS — G43809 Other migraine, not intractable, without status migrainosus: Secondary | ICD-10-CM

## 2019-07-05 NOTE — Patient Instructions (Signed)
-  Nice seeing you today!!  -I'll plan to see you back in March for your annual physical.

## 2019-07-05 NOTE — Progress Notes (Signed)
Established Patient Office Visit     CC/Reason for Visit: Establish care, follow-up chronic conditions  HPI: Gwendolyn Flores is a 53 y.o. female who is coming in today for the above mentioned reasons. Past Medical History is significant for: History of vestibular migraines followed by neurology, Dr. 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 Elyse Hsu on levothyroxine 100 mcg daily.  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  She is a never smoker, drinks alcohol only occasionally and infrequently.  She has allergies to sulfa drugs and codeine which causes nausea as well as to Levaquin which causes palpitations.  Her family history is significant for father who died with an MI in his 80s, maternal grandfather with ALS and a paternal grandfather who died of cancer of unknown primary.  She had a colonoscopy at age 61 and was asked to return in 7 years by Dr. Benson Norway.  She sees a GYN Dr. Glo Herring usually in December of each year who is in charge of his cervical cancer screenings and mammograms.  She has no acute complaints today.   Past Medical/Surgical History: Past Medical History:  Diagnosis Date  . Cancer [facility])    thyroid cancer  . Headache   . History of thyroid cancer 06/28/2013  . Rectocele 06/28/2013  . Thyroid disease    cancer    Past Surgical History:  Procedure Laterality Date  . THYROIDECTOMY    . TONSILECTOMY, ADENOIDECTOMY, BILATERAL MYRINGOTOMY AND TUBES      Social History:  reports that she has never smoked. She has never used smokeless tobacco. She reports current alcohol use. She reports that she does not use drugs.  Allergies: Allergies  Allergen Reactions  . Codeine Nausea And Vomiting  . Levaquin [Levofloxacin In D5w] Other (See Comments)    A fib; abnormal heart rhythm  . Sulfa Antibiotics Nausea And Vomiting    Family History:  Family History  Problem Relation Age of Onset   . Heart disease Father        heart attack  . Diabetes Brother   . Migraines Neg Hx      Current Outpatient Medications:  .  Ascorbic Acid (VITAMIN C) 1000 MG tablet, Take 1,000 mg by mouth daily., Disp: , Rfl:  .  aspirin-acetaminophen-caffeine (EXCEDRIN MIGRAINE) 250-250-65 MG tablet, Take 1 tablet by mouth every 6 (six) hours as needed for headache. Reported on 12/14/2015, Disp: , Rfl:  .  Calcium-Magnesium-Vitamin D (CALCIUM MAGNESIUM PO), Take 3 tablets by mouth daily., Disp: , Rfl:  .  fexofenadine (ALLEGRA) 180 MG tablet, Take 180 mg by mouth daily., Disp: , Rfl:  .  ibuprofen (ADVIL,MOTRIN) 200 MG tablet, Take 200 mg by mouth as needed., Disp: , Rfl:  .  naproxen (NAPROSYN) 500 MG tablet, Take with Imitrex for migraine or vertigo., Disp: 60 tablet, Rfl: 12 .  SUMAtriptan (IMITREX) 100 MG tablet, TAKE 1 TABLET BY MOUTH ONCE AS NEEDED. MAY REPEAT IN 2 HOURS IF HEADACHE PERSISTS OR RECURS. MAX 2 TAB/DAY. NEED YEARLY FOLLOW-UP., Disp: 10 tablet, Rfl: 0 .  SYNTHROID 125 MCG tablet, Take 125 mcg by mouth daily before breakfast. , Disp: , Rfl:  .  SUMAtriptan-naproxen (TREXIMET) 85-500 MG tablet, Take 1 tablet by mouth once., Disp: 10 tablet, Rfl: 12  Review of Systems:  Constitutional: Denies fever, chills, diaphoresis, appetite change and fatigue.  HEENT: Denies photophobia, eye pain, redness, hearing loss, ear pain,  congestion, sore throat, rhinorrhea, sneezing, mouth sores, trouble swallowing, neck pain, neck stiffness and tinnitus.   Respiratory: Denies SOB, DOE, cough, chest tightness,  and wheezing.   Cardiovascular: Denies chest pain, palpitations and leg swelling.  Gastrointestinal: Denies nausea, vomiting, abdominal pain, diarrhea, constipation, blood in stool and abdominal distention.  Genitourinary: Denies dysuria, urgency, frequency, hematuria, flank pain and difficulty urinating.  Endocrine: Denies: hot or cold intolerance, sweats, changes in hair or nails, polyuria,  polydipsia. Musculoskeletal: Denies myalgias, back pain, joint swelling, arthralgias and gait problem.  Skin: Denies pallor, rash and wound.  Neurological: Denies dizziness, seizures, syncope, weakness, light-headedness, numbness and headaches.  Hematological: Denies adenopathy. Easy bruising, personal or family bleeding history  Psychiatric/Behavioral: Denies suicidal ideation, mood changes, confusion, nervousness, sleep disturbance and agitation    Physical Exam: Vitals:   07/05/19 0855  BP: 108/60  Pulse: 85  Temp: (!) 97.2 F (36.2 C)  TempSrc: Temporal  SpO2: 98%  Weight: 142 lb 9.6 oz (64.7 kg)    Body mass index is 23.73 kg/m.   Constitutional: NAD, calm, comfortable Eyes: PERRL, lids and conjunctivae normal ENMT: Mucous membranes are moist.  Respiratory: clear to auscultation bilaterally, no wheezing, no crackles. Normal respiratory effort. No accessory muscle use.  Cardiovascular: Regular rate and rhythm, no murmurs / rubs / gallops. No extremity edema. 2+ pedal pulses. No carotid bruits.  Abdomen: no tenderness, no masses palpated. No hepatosplenomegaly. Bowel sounds positive.  Musculoskeletal: no clubbing / cyanosis. No joint deformity upper and lower extremities. Good ROM, no contractures. Normal muscle tone.  Skin: no rashes, lesions, ulcers. No induration Neurologic: CN 2-12 grossly intact. Sensation intact, DTR normal. Strength 5/5 in all 4.  Psychiatric: Normal judgment and insight. Alert and oriented x 3. Normal mood.    Impression and Plan:  Vestibular migraine -Followed by neurology on Imitrex and Engality.  History of thyroid cancer -Status post total thyroidectomy now on levothyroxine supplementation, followed by endocrinology.  Had annual physical earlier this year in March. Already received flu shot.    Patient Instructions  -Nice seeing you today!!  -I'll plan to see you back in March for your annual physical.     Lelon Frohlich, MD [facility]

## 2019-07-08 DIAGNOSIS — M9903 Segmental and somatic dysfunction of lumbar region: Secondary | ICD-10-CM | POA: Diagnosis not present

## 2019-07-08 DIAGNOSIS — M9901 Segmental and somatic dysfunction of cervical region: Secondary | ICD-10-CM | POA: Diagnosis not present

## 2019-07-08 DIAGNOSIS — M545 Low back pain: Secondary | ICD-10-CM | POA: Diagnosis not present

## 2019-07-08 DIAGNOSIS — M546 Pain in thoracic spine: Secondary | ICD-10-CM | POA: Diagnosis not present

## 2019-07-08 DIAGNOSIS — M9904 Segmental and somatic dysfunction of sacral region: Secondary | ICD-10-CM | POA: Diagnosis not present

## 2019-07-08 DIAGNOSIS — M9905 Segmental and somatic dysfunction of pelvic region: Secondary | ICD-10-CM | POA: Diagnosis not present

## 2019-07-08 DIAGNOSIS — M542 Cervicalgia: Secondary | ICD-10-CM | POA: Diagnosis not present

## 2019-07-08 DIAGNOSIS — M9902 Segmental and somatic dysfunction of thoracic region: Secondary | ICD-10-CM | POA: Diagnosis not present

## 2019-07-23 ENCOUNTER — Telehealth: Payer: Self-pay | Admitting: *Deleted

## 2019-07-23 NOTE — Telephone Encounter (Signed)
Called pt & LVM asking to move pt's 11/9 9:30 AM appt to NP same day (currently same time available) d/t schedule adjustment. Left office number in message.

## 2019-07-23 NOTE — Telephone Encounter (Signed)
Pt has called back to confirm she will keep her 11-9 appointment with Dr Jaynee Eagles

## 2019-08-05 ENCOUNTER — Ambulatory Visit (INDEPENDENT_AMBULATORY_CARE_PROVIDER_SITE_OTHER): Payer: 59 | Admitting: Neurology

## 2019-08-05 ENCOUNTER — Other Ambulatory Visit: Payer: Self-pay

## 2019-08-05 ENCOUNTER — Encounter: Payer: Self-pay | Admitting: Neurology

## 2019-08-05 VITALS — BP 113/71 | HR 81 | Temp 97.9°F | Ht 65.0 in | Wt 142.0 lb

## 2019-08-05 DIAGNOSIS — M9902 Segmental and somatic dysfunction of thoracic region: Secondary | ICD-10-CM | POA: Diagnosis not present

## 2019-08-05 DIAGNOSIS — M9904 Segmental and somatic dysfunction of sacral region: Secondary | ICD-10-CM | POA: Diagnosis not present

## 2019-08-05 DIAGNOSIS — M542 Cervicalgia: Secondary | ICD-10-CM | POA: Diagnosis not present

## 2019-08-05 DIAGNOSIS — G43709 Chronic migraine without aura, not intractable, without status migrainosus: Secondary | ICD-10-CM

## 2019-08-05 DIAGNOSIS — M545 Low back pain: Secondary | ICD-10-CM | POA: Diagnosis not present

## 2019-08-05 DIAGNOSIS — M9903 Segmental and somatic dysfunction of lumbar region: Secondary | ICD-10-CM | POA: Diagnosis not present

## 2019-08-05 DIAGNOSIS — M546 Pain in thoracic spine: Secondary | ICD-10-CM | POA: Diagnosis not present

## 2019-08-05 DIAGNOSIS — M9905 Segmental and somatic dysfunction of pelvic region: Secondary | ICD-10-CM | POA: Diagnosis not present

## 2019-08-05 DIAGNOSIS — M9901 Segmental and somatic dysfunction of cervical region: Secondary | ICD-10-CM | POA: Diagnosis not present

## 2019-08-05 MED ORDER — NURTEC 75 MG PO TBDP: 75.0000 mg | ORAL_TABLET | Freq: Every day | ORAL | 6 refills | Status: DC | PRN

## 2019-08-05 MED ORDER — EMGALITY 120 MG/ML ~~LOC~~ SOAJ: 120.0000 mg | SUBCUTANEOUS | 11 refills | Status: DC

## 2019-08-05 MED ORDER — DICLOFENAC POTASSIUM(MIGRAINE) 50 MG PO PACK: 50.0000 mg | PACK | Freq: Once | ORAL | 11 refills | Status: DC | PRN

## 2019-08-05 NOTE — Progress Notes (Signed)
[id number]    Provider:  Dr Jaynee Eagles Referring Provider: Sinda Du, MD Primary Care Physician:  Gwendolyn Bliss, Rayford Halsted, MD  CC: Vestibular migraines  Interval history 08/05/2019: Botox extremely beneficial. She was having 15 headache days a month with 8 severe migraines but since we started botox she is extremely better > 50% improvement. Imitrex works well. Emgality has really helped. She is doing well right now. Trokendi titrated up to 50mg  and helped but the hair loss. Could try Qudexy, or zonegran. She did not see a difference with the Ajovy vs emgality ve aimovig, Over the last several months she has been having about 4 headaches a month, in August she had some vertigo and 3 headache days and one migraine, ongoing at this improved frequency. But in September had 9 headache days and 2 migraine days oral or Tosymra she likes it. October 6 headache days and 0 migraines. The headaches are more tension tyoe, tensionin the temporal lobes always sometimes in the back. Dry needling helped but she sees a Restaurant manager, fast food.  Lightly in the cervical spinal muscles last time she felt weak. She thinks she has fibromyalgia,   Interval history 08/03/2018: Imtrex works well acutely.  Migraines usually last a day and 20 headache days a month and 12 migraine days. Vertigo has improved. Throbbing behind the eyes, +nausea, light and sound sensitivity, a dark room helps. No medication overuse. No aura.   Interval history 06/05/2017: She takes Treximet, still helping. Over the last 3 months she has had a few spells of vertigo when changing positions, she had it for about a week and 2 spells.  Other than that no vertigo. She went through menopause. Her migraines are not that often. She has a nagging headache weekly. Her migraines are behind the eyes and in the temples and someimtes in the top, just pain unsure how to describe it, she gets nausea, no vomiting, + light sensitivity, no sound  sensitivity. The weekly headache responds to motrin. She takes treximet when she gets dizzy. Last migraine February but since then she has weekly headaches and episodes of dizziness. The weekly headache   Interval History 05/02/2016: No side effects since the medication. She has only had 2 dizzy spells. Occ when she turns to the left in the morning she gets dizzy. She has taken the Treximet twice since going up to 50mg  Trokendi qhs. Her husband is general surgery employed by [employer]. Daughter is 26. She has a daughter who is 15 and a son who is 43. SHe feels great andis very happy. Discussed we will stay at current dose with treximet prn.   MRI of the brain was normal.  Interval History: 53 year old female with vestibular migraines. She tried treximet and it worked. She took it at 7am. It worked and no side effects. She started the Trokendi on Friday. She started at 25mg . Her neck is better. The positional vertigo is better. Since last being seen she has headaches 2-3x a month. She had one bad one as above and Treximet helped. Since February 20th has had the positional vertigo constantly. Few times had the headache mostly behind the eys and in the temple.Vestibular therapy.    HPI: Gwendolyn Flores is a 53 y.o. female here as a referral from Dr. Luan Pulling for headaches. 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 Headaches started in 2010 with nausea and dizziness. Lasted for over a day.  Didn't have any again until 2013 but now getting more frequent and more severe. The dizziness occurs with headaches. The headaches are behind the eyes and in the temples and someimtes in the top, just pain unsure how to describe it, she gets nausea, no vomiting, + light sensitivity, no sound sensitivity. She takes excedrin migraine. She takes it sporadically some weeks 3-4x others none. At least 2x a week it  gets bad enough to take excedrin. Last month had 15 headaches a month, this month only one. Off balance like on a boat. She has had room spinning occ when laying down but more off balance. No other associated symptoms. No inciting events. No focal neurologic deficits. She provides a headache diary today with details that the dizziness started back in June 2015 but the headaches did not start until August 2016. When the headaches began, the episodes of headache with dizziness increased with the worst month being  Reviewed notes, labs and imaging from outside physicians, which showed;  Reviewed log that patient brought in. Patient is tried Antivert or Valium 2 mg every 6-8 hours for her dizziness. Effexor XR was not effective. First spell was in December 2010 when she was traveling to [place] [facility]. Stopped at [employer] that were set night in the next morning. Had nausea. No vomiting. Lasted over a week. Had a small spell early in 2013. Second big spell June 2013 at the beach. Got worse that day and the next. Had nausea lasted 3-4 days. The next spell was 08/29/2012 at work. Worse at night in the next morning. She had an upset stomach. Lasted a day.  February 2014: Dizziness for 2 days worse with laying down March 2013: 1 day of dizziness, upon waking April 2014 slight dizziness on laying down April 2015 slight dizziness one day. Better with caffeine. 02/2014 dizziness for 2 days October 2015: 5 days of dizzy spells and nausea. Onset of headaches. June 2016: 1 episode of dizzy spell and nausea July 2016: She woke up with dizziness that lasted 3 weeks. August 2016: 9 days of headache and dizziness September 2016 3 days of headache and dizziness October 2016: 1 episode of dizziness November 2016: 13 headache days with dizziness. December 2016: 1 episode of headache  Reviewed [facility] from [facility]. Stated she is having headache and dizziness with increasing frequency. No change in her  hearing although she does have occasional clicking in her left ear.  Review of Systems: Patient complains of symptoms per HPI as well as the following symptoms: Eye pain, hearing changes in left ear, headache, dizziness, anxiety. Pertinent negatives per HPI. All others negative.     Social History   Socioeconomic History   Marital status: Married    Spouse name: Elta Guadeloupe    Number of children: 2   Years of education: 16   Highest education level: Not on file  Occupational History   Occupation: Designer, multimedia strain: Not on file   Food insecurity    Worry: Not on file    Inability: Not on Lexicographer needs    Medical: Not on file    Non-medical: Not on file  Tobacco Use   Smoking status: Never Smoker   Smokeless tobacco: Never Used  Substance and Sexual Activity   Alcohol use: Yes    Comment: wine socially   Drug use: No   Sexual activity: Yes    Birth control/protection: Other-see comments    Comment: vasectomy  Lifestyle   Physical activity    Days per week: Not on file    Minutes per session: Not on file   Stress: Not on file  Relationships   Social connections    Talks on phone: Not on file    Gets together: Not on file    Attends religious service: Not on file    Active member of club or organization: Not on file    Attends meetings of clubs or organizations: Not on file    Relationship status: Not on file   Intimate partner violence    Fear of current or ex partner: Not on file    Emotionally abused: Not on file    Physically abused: Not on file    Forced sexual activity: Not on file  Other Topics Concern   Not on file  Social History Narrative   Lives at home with husband and children.   Caffeine use: 1 cup per day   Right handed    Family History  Problem Relation Age of Onset   Heart disease Father        heart attack   Diabetes Brother    Migraines Neg Hx     Past Medical History:    Diagnosis Date   Arthritis    Cancer ([place])    thyroid cancer   Chicken pox    Headache    High cholesterol    History of thyroid cancer 06/28/2013   Rectocele 06/28/2013   Thyroid disease    cancer   Urinary tract infection     Past Surgical History:  Procedure Laterality Date   THYROIDECTOMY     TONSILECTOMY, ADENOIDECTOMY, BILATERAL MYRINGOTOMY AND TUBES      Current Outpatient Medications  Medication Sig Dispense Refill   Ascorbic Acid (VITAMIN C) 1000 MG tablet Take 1,000 mg by mouth daily.     aspirin-acetaminophen-caffeine (EXCEDRIN MIGRAINE) 250-250-65 MG tablet Take 1 tablet by mouth every 6 (six) hours as needed for headache. Reported on 12/14/2015     Calcium-Magnesium-Vitamin D (CALCIUM MAGNESIUM PO) Take 3 tablets by mouth daily.     ibuprofen (ADVIL,MOTRIN) 200 MG tablet Take 200 mg by mouth as needed.     naproxen (NAPROSYN) 500 MG tablet Take with Imitrex for migraine or vertigo. 60 tablet 12   SUMAtriptan (IMITREX) 100 MG tablet TAKE 1 TABLET BY MOUTH ONCE AS NEEDED. MAY REPEAT IN 2 HOURS IF HEADACHE PERSISTS OR RECURS. MAX 2 TAB/DAY. NEED YEARLY FOLLOW-UP. 10 tablet 0   SYNTHROID 125 MCG tablet Take 100 mcg by mouth daily before breakfast.     Diclofenac Potassium,Migraine, 50 MG PACK Take 50 mg by mouth once as needed. For migraine. 9 each 11   Galcanezumab-gnlm (EMGALITY) 120 MG/ML SOAJ Inject 120 mg into the skin every 30 (thirty) days. 1 pen 11   Rimegepant Sulfate (NURTEC) 75 MG TBDP Take 75 mg by mouth daily as needed. For migraines. Take as close to onset of migraine as possible. One daily maximum. 10 tablet 6   No current facility-administered medications for this visit.     Allergies as of 08/05/2019 - Review Complete 08/05/2019  Allergen Reaction Noted   Codeine Nausea And Vomiting 06/28/2013   Levaquin [levofloxacin in d5w] Other (See Comments) 10/05/2015   Sulfa antibiotics Nausea And Vomiting 06/28/2013    Vitals: BP  113/71 (BP Location: Right Arm, Patient Position: Sitting)    Pulse 81    Temp 97.9 F (36.6 C) Comment: taken at front door  Ht 5\' 5"  (1.651 m)    Wt 142 lb (64.4 kg)    BMI 23.63 kg/m  Last Weight:  Wt Readings from Last 1 Encounters:  08/05/19 142 lb (64.4 kg)   Last Height:   Ht Readings from Last 1 Encounters:  08/05/19 5\' 5"  (1.651 m)   Physical exam: Exam: Gen: NAD, conversant, well nourised, well groomed                     CV: RRR, no MRG. No Carotid Bruits. No peripheral edema, warm, nontender Eyes: Conjunctivae clear without exudates or hemorrhage  Neuro: Detailed Neurologic Exam  Speech:    Speech is normal; fluent and spontaneous with normal comprehension.  Cognition:    The patient is oriented to person, place, and time;     recent and remote memory intact;     language fluent;     normal attention, concentration,     fund of knowledge Cranial Nerves:    The pupils are equal, round, and reactive to light. The fundi are normal and spontaneous venous pulsations are present. Visual fields are full to finger confrontation. Extraocular movements are intact. Trigeminal sensation is intact and the muscles of mastication are normal. The face is symmetric. The palate elevates in the midline. Hearing intact. Voice is normal. Shoulder shrug is normal. The tongue has normal motion without fasciculations.   Coordination:    Normal finger to nose and heel to shin. Normal rapid alternating movements.   Gait:    Heel-toe and tandem gait are normal.   Motor Observation:    No asymmetry, no atrophy, and no involuntary movements noted. Tone:    Normal muscle tone.    Posture:    Posture is normal. normal erect    Strength:    Strength is V/V in the upper and lower limbs.      Sensation: intact to LT     Reflex Exam:  DTR's:    Deep tendon reflexes in the upper and lower extremities are normal bilaterally.   Toes:    The toes are downgoing bilaterally.   Clonus:     Clonus is absent.   Assessment/Plan: 53 year old female chronic migraines doing exceptionally well on botox  Botox extremely beneficial. She was having 15 headache days a month with 8 severe migraines but since we started botox she is extremely better > 50% improvement..  Nurtec: as needed for migraine - samples Cambia: up to 2x a day for headache or migraine - samples Continue Imitrex oral and the Tosymra Rimegepant: Patient drug information Continue Botox for migraines Continue emgality  - Discussion with patient regarding migraine triggers and behavioral modifications that can help prevent migraines. Provided information sheet with common migraine triggers including foods to avoid. As far as diagnostic testing: MRi of the brain was normal  Discussed: To prevent or relieve headaches, try the following: Cool Compress. Lie down and place a cool compress on your head.  Avoid headache triggers. If certain foods or odors seem to have triggered your migraines in the past, avoid them. A headache diary might help you identify triggers.  Include physical activity in your daily routine. Try a daily walk or other moderate aerobic exercise.  Manage stress. Find healthy ways to cope with the stressors, such as delegating tasks on your to-do list.  Practice relaxation techniques. Try deep breathing, yoga, massage and visualization.  Eat regularly. Eating regularly scheduled meals and maintaining a healthy diet might help prevent headaches. Also,  drink plenty of fluids.  Follow a regular sleep schedule. Sleep deprivation might contribute to headaches Consider biofeedback. With this mind-body technique, you learn to control certain bodily functions -- such as muscle tension, heart rate and blood pressure -- to prevent headaches or reduce headache pain.    Proceed to emergency room if you experience new or worsening symptoms or symptoms do not resolve, if you have new neurologic symptoms or if  headache is severe, or for any concerning symptom.   Provided education and documentation from American headache Society toolbox including articles on: chronic migraine medication overuse headache, chronic migraines, prevention of migraines, behavioral and other nonpharmacologic treatments for headache.   Sarina Ill, MD  [facility] [address] [place], [place] [address]  Phone [phone] Fax [phone]  Consent Form Botulism Toxin Injection For Chronic Migraine    Reviewed orally with patient, additionally signature is on file:  Botulism toxin has been approved by the Federal drug administration for treatment of chronic migraine. Botulism toxin does not cure chronic migraine and it may not be effective in some patients.  The administration of botulism toxin is accomplished by injecting a small amount of toxin into the muscles of the neck and head. Dosage must be titrated for each individual. Any benefits resulting from botulism toxin tend to wear off after 3 months with a repeat injection required if benefit is to be maintained. Injections are usually done every 3-4 months with maximum effect peak achieved by about 2 or 3 weeks. Botulism toxin is expensive and you should be sure of what costs you will incur resulting from the injection.  The side effects of botulism toxin use for chronic migraine may include:   -Transient, and usually mild, facial weakness with facial injections  -Transient, and usually mild, head or neck weakness with head/neck injections  -Reduction or loss of forehead facial animation due to forehead muscle weakness  -Eyelid drooping  -Dry eye  -Pain at the site of injection or bruising at the site of injection  -Double vision  -Potential unknown long term risks  Contraindications: You should not have Botox if you are pregnant, nursing, allergic to albumin, have an infection, skin condition, or muscle weakness at the  site of the injection, or have myasthenia gravis, Lambert-Eaton syndrome, or ALS.  It is also possible that as with any injection, there may be an allergic reaction or no effect from the medication. Reduced effectiveness after repeated injections is sometimes seen and rarely infection at the injection site may occur. All care will be taken to prevent these side effects. If therapy is given over a long time, atrophy and wasting in the muscle injected may occur. Occasionally the patient's become refractory to treatment because they develop antibodies to the toxin. In this event, therapy needs to be modified.  I have read the above information and consent to the administration of botulism toxin.    BOTOX PROCEDURE NOTE FOR MIGRAINE HEADACHE    Contraindications and precautions discussed with patient(above). Aseptic procedure was observed and patient tolerated procedure. Procedure performed by Dr. Georgia Dom  The condition has existed for more than 6 months, and pt does not have a diagnosis of ALS, Myasthenia Gravis or Lambert-Eaton Syndrome.  Risks and benefits of injections discussed and pt agrees to proceed with the procedure.  Written consent obtained  These injections are medically necessary. Pt  receives good benefits from these injections. These injections do not cause sedations or hallucinations which the oral therapies  may cause.  Description of procedure:  The patient was placed in a sitting position. The standard protocol was used for Botox as follows, with 5 units of Botox injected at each site:   -Procerus muscle, midline injection  -Corrugator muscle, bilateral injection  -Frontalis muscle, bilateral injection, with 2 sites each side, medial injection was performed in the upper one third of the frontalis muscle, in the region vertical from the medial inferior edge of the superior orbital rim. The lateral injection was again in the upper one third of the forehead vertically above  the lateral limbus of the cornea, 1.5 cm lateral to the medial injection site.  -Temporalis muscle injection, 4 sites, bilaterally. The first injection was 3 cm above the tragus of the ear, second injection site was 1.5 cm to 3 cm up from the first injection site in line with the tragus of the ear. The third injection site was 1.5-3 cm forward between the first 2 injection sites. The fourth injection site was 1.5 cm posterior to the second injection site.   -Occipitalis muscle injection, 3 sites, bilaterally. The first injection was done one half way between the occipital protuberance and the tip of the mastoid process behind the ear. The second injection site was done lateral and superior to the first, 1 fingerbreadth from the first injection. The third injection site was 1 fingerbreadth superiorly and medially from the first injection site.  -Cervical paraspinal muscle injection, 2 sites, bilateral knee first injection site was 1 cm from the midline of the cervical spine, 3 cm inferior to the lower border of the occipital protuberance. The second injection site was 1.5 cm superiorly and laterally to the first injection site.  -Trapezius muscle injection was performed at 3 sites, bilaterally. The first injection site was in the upper trapezius muscle halfway between the inflection point of the neck, and the acromion. The second injection site was one half way between the acromion and the first injection site. The third injection was done between the first injection site and the inflection point of the neck.   Will return for repeat injection in 3 months.   200 units of Botox was used, any Botox not injected was wasted. The patient tolerated the procedure well, there were no complications of the above procedure.

## 2019-08-05 NOTE — Progress Notes (Signed)
Botox- 100 units x 2 vials Lot: [id number] Expiration: EXP: 12/2021 NDC: [id number] & Lot: [id number] EXP: 03/2022 NDC: [id number]  Bacteriostatic 0.9% Sodium Chloride- 49mL total Lot: [id number] Expiration: 03/26/2020 NDC: [id number]  Dx: [id number] B/B

## 2019-08-05 NOTE — Patient Instructions (Addendum)
Nurtec: as needed for migraine - samples Cambia: up to 2x a day for headache or migraine - samples Continue Imitrex oral and the Tosymra Rimegepant: Patient drug information Continue Botox for migraines   Access Lex[employer] Online here. Copyright ([phone]) [phone] [employer] All rights reserved. (For additional information see "Rimegepant: Drug information") Brand Names: [place]  Nurtec  What is this drug used for?   It is used to treat migraine headaches.  What do I need to tell my doctor BEFORE I take this drug?   If you are allergic to this drug; any part of this drug; or any other drugs, foods, or substances. Tell your doctor about the allergy and what signs you had.   If you have any of these health problems: Kidney disease or liver disease.   If you take any drugs (prescription or OTC, natural products, vitamins) that must not be taken with this drug, like certain drugs that are used for HIV, infections, or seizures. There are many drugs that must not be taken with this drug.   This is not a list of all drugs or health problems that interact with this drug.   Tell your doctor and pharmacist about all of your drugs (prescription or OTC, natural products, vitamins) and health problems. You must check to make sure that it is safe for you to take this drug with all of your drugs and health problems. Do not start, stop, or change the dose of any drug without checking with your doctor.  What are some things I need to know or do while I take this drug?   Tell all of your health care providers that you take this drug. This includes your doctors, nurses, pharmacists, and dentists.   This drug is not meant to prevent or lower the number of migraine headaches you get.   Tell your doctor if you are pregnant, plan on getting pregnant, or are breast-feeding. You will need to talk about the benefits and risks to you and the baby.  What are some side effects that I need to call my doctor about right away?    WARNING/CAUTION: Even though it may be rare, some people may have very bad and sometimes deadly side effects when taking a drug. Tell your doctor or get medical help right away if you have any of the following signs or symptoms that may be related to a very bad side effect:   Signs of an allergic reaction, like rash; hives; itching; red, swollen, blistered, or peeling skin with or without fever; wheezing; tightness in the chest or throat; trouble breathing, swallowing, or talking; unusual hoarseness; or swelling of the mouth, face, lips, tongue, or throat.  What are some other side effects of this drug?   All drugs may cause side effects. However, many people have no side effects or only have minor side effects. Call your doctor or get medical help if any of these side effects or any other side effects bother you or do not go away:   Upset stomach.   These are not all of the side effects that may occur. If you have questions about side effects, call your doctor. Call your doctor for medical advice about side effects.   You may report side effects to your national health agency.  How is this drug best taken?   Use this drug as ordered by your doctor. Read all information given to you. Follow all instructions closely.   Do not push the tablet out of  the foil when opening. Use dry hands to take it from the foil. Place on your tongue and let it dissolve. Water is not needed. Do not swallow it whole. Do not chew, break, or crush it.   If needed, you may place the tablet under the tongue.   Use right after opening.  What do I do if I miss a dose?   This drug is taken on an as needed basis. Do not take more often than told by the doctor.  How do I store and/or throw out this drug?   Store at room temperature in a dry place. Do not store in a bathroom.   Store in foil pouch until ready for use.   Keep all drugs in a safe place. Keep all drugs out of the reach of children and pets.   Throw away unused  or expired drugs. Do not flush down a toilet or pour down a drain unless you are told to do so. Check with your pharmacist if you have questions about the best way to throw out drugs. There may be drug take-back programs in your area.  General drug facts   If your symptoms or health problems do not get better or if they become worse, call your doctor.   Do not share your drugs with others and do not take anyone else's drugs.   Some drugs may have another patient information leaflet. If you have any questions about this drug, please talk with your doctor, nurse, pharmacist, or other health care provider.   If you think there has been an overdose, call your poison control center or get medical care right away. Be ready to tell or show what was taken, how much, and when it happened.  Use of UpToDate is subject to the Subscription and License Agreement. Topic [phone] Version 6.0  Diclofenac powder for oral solution What is this medicine? DICLOFENAC (dye KLOE fen ak) is a non-steroidal anti-inflammatory drug (NSAID). It is used to treat migraine pain. This medicine may be used for other purposes; ask your health care provider or pharmacist if you have questions. COMMON BRAND NAME(S): Cambia What should I tell my health care provider before I take this medicine? They need to know if you have any of these conditions:  asthma, especially aspirin sensitive asthma  coronary artery bypass graft (CABG) surgery within the past 2 weeks  drink more than 3 alcohol-containing drinks a day  heart disease or circulation problems like heart failure or leg edema (fluid retention)  high blood pressure  kidney disease  liver disease  phenylketonuria  stomach problems  an unusual or allergic reaction to diclofenac, aspirin, other NSAIDs, other medicines, foods, dyes, or preservatives  pregnant or trying to get pregnant  breast-feeding How should I use this medicine? Mix this medicine with 1 to 2  ounces of water. Drink the medicine and water together. Follow the directions on the prescription label. Do not take your medicine more often than directed. Long-term, continuous use may increase the risk of heart attack or stroke. A special MedGuide will be given to you by the pharmacist with each prescription and refill. Be sure to read this information carefully each time. Talk to your pediatrician regarding the use of this medicine in children. Special care may be needed. Elderly patients over 19 years old may have a stronger reaction and need a smaller dose. Overdosage: If you think you have taken too much of this medicine contact a poison control center or  emergency room at once. NOTE: This medicine is only for you. Do not share this medicine with others. What if I miss a dose? This does not apply. What may interact with this medicine? Do not take this medicine with any of the following medications:  cidofovir  ketorolac  methotrexate This medicine may also interact with the following medications:  alcohol  aspirin and aspirin-like medicines  cyclosporine  diuretics  lithium  medicines for blood pressure  medicines for osteoporosis  medicines that affect platelets  medicines that treat or prevent blood clots like warfarin  NSAIDs, medicines for pain and inflammation, like ibuprofen or naproxen  pemetrexed  steroid medicines like prednisone or cortisone This list may not describe all possible interactions. Give your health care provider a list of all the medicines, herbs, non-prescription drugs, or dietary supplements you use. Also tell them if you smoke, drink alcohol, or use illegal drugs. Some items may interact with your medicine. What should I watch for while using this medicine? Tell your doctor or healthcare provider if your pain does not get better. Talk to your doctor before taking another medicine for pain. Do not treat yourself. This medicine may cause  serious skin reactions. They can happen weeks to months after starting the medicine. Contact your healthcare provider right away if you notice fevers or flu-like symptoms with a rash. The rash may be red or purple and then turn into blisters or peeling of the skin. Or, you might notice a red rash with swelling of the face, lips or lymph nodes in your neck or under your arms. This medicine does not prevent heart attack or stroke. In fact, this medicine may increase the chance of a heart attack or stroke. The chance may increase with longer use of this medicine and in people who have heart disease. If you take aspirin to prevent heart attack or stroke, talk with your doctor or healthcare provider. Do not take medicines such as ibuprofen and naproxen with this medicine. Side effects such as stomach upset, nausea, or ulcers may be more likely to occur. Many medicines available without a prescription should not be taken with this medicine. This medicine can cause ulcers and bleeding in the stomach and intestines at any time during treatment. Do not smoke cigarettes or drink alcohol. These increase irritation to your stomach and can make it more susceptible to damage from this medicine. Ulcers and bleeding can happen without warning symptoms and can cause death. You may get drowsy or dizzy. Do not drive, use machinery, or do anything that needs mental alertness until you know how this medicine affects you. Do not stand or sit up quickly, especially if you are an older patient. This reduces the risk of dizzy or fainting spells. This medicine can cause you to bleed more easily. Try to avoid damage to your teeth and gums when you brush or floss your teeth. If you take migraine medicines for 10 or more days a month, your migraines may get worse. Keep a diary of headache days and medicine use. Contact your healthcare provider if your migraine attacks occur more frequently. What side effects may I notice from receiving  this medicine? Side effects that you should report to your doctor or health care professional as soon as possible:  allergic reactions like skin rash, itching or hives, swelling of the face, lips, or tongue  black or bloody stools, blood in the urine or vomit  blurred vision  chest pain  difficulty breathing or wheezing  nausea or vomiting  rash, fever, and swollen lymph nodes  redness, blistering, peeling or loosening of the skin, including inside the mouth  slurred speech or weakness on one side of the body  trouble passing urine or change in the amount of urine  unexplained weight gain or swelling  unusually weak or tired  yellowing of eyes or skin Side effects that usually do not require medical attention (report to your doctor or health care professional if they continue or are bothersome):  constipation  diarrhea  dizziness  headache  heartburn This list may not describe all possible side effects. Call your doctor for medical advice about side effects. You may report side effects to FDA at 1-800-FDA-1088. Where should I keep my medicine? Keep out of the reach of children. Store at room temperature between 15 and 30 degrees C (59 and 86 degrees F). Throw away any unused medicine after the expiration date. NOTE: This sheet is a summary. It may not cover all possible information. If you have questions about this medicine, talk to your doctor, pharmacist, or health care provider.  2020 Elsevier/Gold Standard (2018-11-28 13:00:15)

## 2019-08-06 ENCOUNTER — Telehealth: Payer: Self-pay | Admitting: *Deleted

## 2019-08-06 NOTE — Telephone Encounter (Addendum)
Emgality 120 mg PA completed on CMM. Key: [id number]. Awaiting [employer] determination.   08/08/2019 I faxed answers to additional questions back to [facility]. Their question was regarding tried preventives. I advised she had hair loss from trokendi XR and that she is on botox and it helps her migraines. [place]: [id number]. Received a receipt of confirmation.

## 2019-08-09 DIAGNOSIS — E890 Postprocedural hypothyroidism: Secondary | ICD-10-CM | POA: Diagnosis not present

## 2019-08-09 DIAGNOSIS — C73 Malignant neoplasm of thyroid gland: Secondary | ICD-10-CM | POA: Diagnosis not present

## 2019-08-09 DIAGNOSIS — Z833 Family history of diabetes mellitus: Secondary | ICD-10-CM | POA: Diagnosis not present

## 2019-08-09 DIAGNOSIS — E559 Vitamin D deficiency, unspecified: Secondary | ICD-10-CM | POA: Diagnosis not present

## 2019-08-12 ENCOUNTER — Encounter: Payer: Self-pay | Admitting: Adult Health

## 2019-08-12 ENCOUNTER — Other Ambulatory Visit (HOSPITAL_COMMUNITY)
Admission: RE | Admit: 2019-08-12 | Discharge: 2019-08-12 | Disposition: A | Discharge status: Home / Self Care | Payer: 59 | Source: Ambulatory Visit | Attending: Adult Health | Admitting: Adult Health

## 2019-08-12 ENCOUNTER — Other Ambulatory Visit: Payer: Self-pay

## 2019-08-12 ENCOUNTER — Ambulatory Visit (INDEPENDENT_AMBULATORY_CARE_PROVIDER_SITE_OTHER): Payer: 59 | Admitting: Adult Health

## 2019-08-12 ENCOUNTER — Encounter: Payer: Self-pay | Admitting: *Deleted

## 2019-08-12 VITALS — BP 95/62 | HR 96 | Ht 65.25 in | Wt 141.0 lb

## 2019-08-12 DIAGNOSIS — Z01419 Encounter for gynecological examination (general) (routine) without abnormal findings: Secondary | ICD-10-CM | POA: Insufficient documentation

## 2019-08-12 DIAGNOSIS — Z1212 Encounter for screening for malignant neoplasm of rectum: Secondary | ICD-10-CM | POA: Diagnosis not present

## 2019-08-12 DIAGNOSIS — Z1211 Encounter for screening for malignant neoplasm of colon: Secondary | ICD-10-CM

## 2019-08-12 LAB — HEMOCCULT GUIAC POC 1CARD (OFFICE): Fecal Occult Blood, POC: NEGATIVE

## 2019-08-12 MED FILL — SUMAtriptan SUCCINATE 100 M: 100 | 30 days supply | Qty: 9 | Fill #1

## 2019-08-12 MED FILL — EMGALITY 120 MG/ML SOAJ: 120 | 30 days supply | Qty: 1 | Fill #0

## 2019-08-12 MED FILL — SYNTHROID 100 MCG TABLET: 100 | 90 days supply | Qty: 90 | Fill #0

## 2019-08-12 NOTE — Telephone Encounter (Addendum)
Received Emgality 120 mg approval from [facility]. Approved for 240 mg dose x 1 08/10/2019 through 09/08/2019. Also approved for 120 mg dose x 5 from 09/02/2019-01/30/2020. Faxed copy to pharmacy and notified pt in mychart. Received a receipt of confirmation.

## 2019-08-12 NOTE — Progress Notes (Signed)
Patient ID: Gwendolyn Flores, female   DOB: [birth year], 53 y.o.   MRN: [id number] History of Present Illness:  Gwendolyn Flores is a 53 year old white female, married, G2P2 in for a well woman gyn exam and pap. PCP is Dr Jerilee Hoh.  Current Medications, Allergies, Past Medical History, Past Surgical History, Family History and Social History were reviewed in [employer] record.     Review of Systems: Patient denies any hearing loss, fatigue, blurred vision, shortness of breath, chest pain, abdominal pain, problems with bowel movements, urination, or intercourse. No mood swings.+joint stiffness  +headaches, on meds and gets Botex.   Physical Exam:BP 95/62 (BP Location: Left Arm, Patient Position: Sitting, Cuff Size: Normal)   Pulse 96   Ht 5' 5.25" (1.657 m)   Wt 141 lb (64 kg)   LMP 08/12/2016   BMI 23.28 kg/m  General:  Well developed, well nourished, no acute distress Skin:  Warm and dry Neck:  Midline trachea, normal thyroid, good ROM, no lymphadenopathy Lungs; Clear to auscultation bilaterally Breast:  No dominant palpable mass, retraction, or nipple discharge Cardiovascular: Regular rate and rhythm Abdomen:  Soft, non tender, no hepatosplenomegaly Pelvic:  External genitalia is normal in appearance, no lesions.  The vagina is normal in appearance. Urethra has no lesions or masses. The cervix is smooth, pap with high risk HPV 16/18 genotyping performed.  Uterus is felt to be normal size, shape, and contour.  No adnexal masses or tenderness noted.Bladder is non tender, no masses felt. Rectal: Good sphincter tone, no polyps, or hemorrhoids felt.  Hemoccult negative.+rectocele Extremities/musculoskeletal:  No swelling or varicosities noted, no clubbing or cyanosis Psych:  No mood changes, alert and cooperative,seems happy Fall risk is low PHQ 2 score is 0. Examination chaperoned by Levy Pupa LPN.  Impression and Plan: 1. Encounter for gynecological examination with  Papanicolaou smear of cervix Pap sent Pap in 3 years if normal Physical in 1 year Mammogram yearly   2. Screening for colorectal cancer Colonoscopy in 2027

## 2019-08-15 LAB — CYTOLOGY - PAP
Comment: NEGATIVE
Diagnosis: NEGATIVE
High risk HPV: NEGATIVE

## 2019-08-16 DIAGNOSIS — Z85850 Personal history of malignant neoplasm of thyroid: Secondary | ICD-10-CM | POA: Diagnosis not present

## 2019-08-16 DIAGNOSIS — E559 Vitamin D deficiency, unspecified: Secondary | ICD-10-CM | POA: Diagnosis not present

## 2019-08-16 DIAGNOSIS — C73 Malignant neoplasm of thyroid gland: Secondary | ICD-10-CM | POA: Diagnosis not present

## 2019-08-16 DIAGNOSIS — E890 Postprocedural hypothyroidism: Secondary | ICD-10-CM | POA: Diagnosis not present

## 2019-08-16 DIAGNOSIS — Z833 Family history of diabetes mellitus: Secondary | ICD-10-CM | POA: Diagnosis not present

## 2019-08-27 MED FILL — TRIAZOLAM 0.25 MG TABLET: 0.25 | 1 days supply | Qty: 1 | Fill #0

## 2019-08-27 MED FILL — PROMETHAZINE 25 MG TABLET: 25 | 1 days supply | Qty: 1 | Fill #0

## 2019-08-27 MED FILL — HYDROmorphone HCL 2 MG TABS: 2 | 1 days supply | Qty: 1 | Fill #0

## 2019-09-06 DIAGNOSIS — M545 Low back pain: Secondary | ICD-10-CM | POA: Diagnosis not present

## 2019-09-06 DIAGNOSIS — M546 Pain in thoracic spine: Secondary | ICD-10-CM | POA: Diagnosis not present

## 2019-09-06 DIAGNOSIS — M542 Cervicalgia: Secondary | ICD-10-CM | POA: Diagnosis not present

## 2019-09-06 DIAGNOSIS — M9904 Segmental and somatic dysfunction of sacral region: Secondary | ICD-10-CM | POA: Diagnosis not present

## 2019-09-06 DIAGNOSIS — M9903 Segmental and somatic dysfunction of lumbar region: Secondary | ICD-10-CM | POA: Diagnosis not present

## 2019-09-06 DIAGNOSIS — M9905 Segmental and somatic dysfunction of pelvic region: Secondary | ICD-10-CM | POA: Diagnosis not present

## 2019-09-06 DIAGNOSIS — M9901 Segmental and somatic dysfunction of cervical region: Secondary | ICD-10-CM | POA: Diagnosis not present

## 2019-09-06 DIAGNOSIS — M9902 Segmental and somatic dysfunction of thoracic region: Secondary | ICD-10-CM | POA: Diagnosis not present

## 2019-09-27 DIAGNOSIS — C50919 Malignant neoplasm of unspecified site of unspecified female breast: Secondary | ICD-10-CM

## 2019-09-27 HISTORY — DX: Malignant neoplasm of unspecified site of unspecified female breast: C50.919

## 2019-10-07 ENCOUNTER — Other Ambulatory Visit: Payer: Self-pay

## 2019-10-07 ENCOUNTER — Ambulatory Visit: Payer: 59 | Attending: Internal Medicine

## 2019-10-07 DIAGNOSIS — Z20822 Contact with and (suspected) exposure to covid-19: Secondary | ICD-10-CM

## 2019-10-09 LAB — NOVEL CORONAVIRUS, NAA: SARS-CoV-2, NAA: NOT DETECTED

## 2019-10-14 DIAGNOSIS — M9904 Segmental and somatic dysfunction of sacral region: Secondary | ICD-10-CM | POA: Diagnosis not present

## 2019-10-14 DIAGNOSIS — M9903 Segmental and somatic dysfunction of lumbar region: Secondary | ICD-10-CM | POA: Diagnosis not present

## 2019-10-14 DIAGNOSIS — M9901 Segmental and somatic dysfunction of cervical region: Secondary | ICD-10-CM | POA: Diagnosis not present

## 2019-10-14 DIAGNOSIS — M542 Cervicalgia: Secondary | ICD-10-CM | POA: Diagnosis not present

## 2019-10-14 DIAGNOSIS — M545 Low back pain: Secondary | ICD-10-CM | POA: Diagnosis not present

## 2019-10-14 DIAGNOSIS — M546 Pain in thoracic spine: Secondary | ICD-10-CM | POA: Diagnosis not present

## 2019-10-14 DIAGNOSIS — M9905 Segmental and somatic dysfunction of pelvic region: Secondary | ICD-10-CM | POA: Diagnosis not present

## 2019-10-14 DIAGNOSIS — M9902 Segmental and somatic dysfunction of thoracic region: Secondary | ICD-10-CM | POA: Diagnosis not present

## 2019-10-25 ENCOUNTER — Ambulatory Visit: Payer: 59 | Admitting: Internal Medicine

## 2019-10-26 ENCOUNTER — Encounter: Payer: Self-pay | Admitting: Internal Medicine

## 2019-10-31 ENCOUNTER — Other Ambulatory Visit (HOSPITAL_COMMUNITY): Payer: Self-pay | Admitting: Internal Medicine

## 2019-10-31 DIAGNOSIS — I729 Aneurysm of unspecified site: Secondary | ICD-10-CM

## 2019-11-01 ENCOUNTER — Ambulatory Visit (HOSPITAL_COMMUNITY)
Admission: RE | Admit: 2019-11-01 | Discharge: 2019-11-01 | Disposition: A | Discharge status: Home / Self Care | Payer: 59 | Source: Ambulatory Visit | Attending: Internal Medicine | Admitting: Internal Medicine

## 2019-11-01 ENCOUNTER — Ambulatory Visit (HOSPITAL_COMMUNITY): Payer: 59

## 2019-11-01 ENCOUNTER — Other Ambulatory Visit: Payer: Self-pay

## 2019-11-01 DIAGNOSIS — I729 Aneurysm of unspecified site: Secondary | ICD-10-CM | POA: Insufficient documentation

## 2019-11-01 DIAGNOSIS — R1900 Intra-abdominal and pelvic swelling, mass and lump, unspecified site: Secondary | ICD-10-CM | POA: Diagnosis not present

## 2019-11-04 DIAGNOSIS — M9905 Segmental and somatic dysfunction of pelvic region: Secondary | ICD-10-CM | POA: Diagnosis not present

## 2019-11-04 DIAGNOSIS — M542 Cervicalgia: Secondary | ICD-10-CM | POA: Diagnosis not present

## 2019-11-04 DIAGNOSIS — M9904 Segmental and somatic dysfunction of sacral region: Secondary | ICD-10-CM | POA: Diagnosis not present

## 2019-11-04 DIAGNOSIS — M546 Pain in thoracic spine: Secondary | ICD-10-CM | POA: Diagnosis not present

## 2019-11-04 DIAGNOSIS — M9902 Segmental and somatic dysfunction of thoracic region: Secondary | ICD-10-CM | POA: Diagnosis not present

## 2019-11-04 DIAGNOSIS — M545 Low back pain: Secondary | ICD-10-CM | POA: Diagnosis not present

## 2019-11-04 DIAGNOSIS — M9901 Segmental and somatic dysfunction of cervical region: Secondary | ICD-10-CM | POA: Diagnosis not present

## 2019-11-04 DIAGNOSIS — M9903 Segmental and somatic dysfunction of lumbar region: Secondary | ICD-10-CM | POA: Diagnosis not present

## 2019-11-12 MED FILL — SYNTHROID 100 MCG TABLET: 100 | 90 days supply | Qty: 90 | Fill #0

## 2019-11-19 MED FILL — EMGALITY 120 MG/ML SOAJ: 120 | 30 days supply | Qty: 1 | Fill #1

## 2019-11-25 DIAGNOSIS — M9903 Segmental and somatic dysfunction of lumbar region: Secondary | ICD-10-CM | POA: Diagnosis not present

## 2019-11-25 DIAGNOSIS — M545 Low back pain: Secondary | ICD-10-CM | POA: Diagnosis not present

## 2019-11-25 DIAGNOSIS — M9902 Segmental and somatic dysfunction of thoracic region: Secondary | ICD-10-CM | POA: Diagnosis not present

## 2019-11-25 DIAGNOSIS — M542 Cervicalgia: Secondary | ICD-10-CM | POA: Diagnosis not present

## 2019-11-25 DIAGNOSIS — M9901 Segmental and somatic dysfunction of cervical region: Secondary | ICD-10-CM | POA: Diagnosis not present

## 2019-11-25 DIAGNOSIS — M9904 Segmental and somatic dysfunction of sacral region: Secondary | ICD-10-CM | POA: Diagnosis not present

## 2019-11-25 DIAGNOSIS — M9905 Segmental and somatic dysfunction of pelvic region: Secondary | ICD-10-CM | POA: Diagnosis not present

## 2019-11-25 DIAGNOSIS — M546 Pain in thoracic spine: Secondary | ICD-10-CM | POA: Diagnosis not present

## 2019-11-25 HISTORY — PX: EYE SURGERY: SHX253

## 2019-12-18 MED FILL — EMGALITY 120 MG/ML SOAJ: 120 | 30 days supply | Qty: 1 | Fill #2

## 2019-12-23 DIAGNOSIS — M542 Cervicalgia: Secondary | ICD-10-CM | POA: Diagnosis not present

## 2019-12-23 DIAGNOSIS — M9903 Segmental and somatic dysfunction of lumbar region: Secondary | ICD-10-CM | POA: Diagnosis not present

## 2019-12-23 DIAGNOSIS — M9902 Segmental and somatic dysfunction of thoracic region: Secondary | ICD-10-CM | POA: Diagnosis not present

## 2019-12-23 DIAGNOSIS — M9901 Segmental and somatic dysfunction of cervical region: Secondary | ICD-10-CM | POA: Diagnosis not present

## 2019-12-23 DIAGNOSIS — M9904 Segmental and somatic dysfunction of sacral region: Secondary | ICD-10-CM | POA: Diagnosis not present

## 2019-12-23 DIAGNOSIS — M9905 Segmental and somatic dysfunction of pelvic region: Secondary | ICD-10-CM | POA: Diagnosis not present

## 2019-12-23 DIAGNOSIS — M546 Pain in thoracic spine: Secondary | ICD-10-CM | POA: Diagnosis not present

## 2019-12-23 DIAGNOSIS — M545 Low back pain: Secondary | ICD-10-CM | POA: Diagnosis not present

## 2019-12-27 ENCOUNTER — Ambulatory Visit: Payer: 59 | Admitting: Internal Medicine

## 2020-01-01 ENCOUNTER — Other Ambulatory Visit (HOSPITAL_COMMUNITY): Payer: Self-pay | Admitting: Obstetrics and Gynecology

## 2020-01-01 DIAGNOSIS — Z1231 Encounter for screening mammogram for malignant neoplasm of breast: Secondary | ICD-10-CM

## 2020-01-06 ENCOUNTER — Other Ambulatory Visit: Payer: Self-pay

## 2020-01-06 ENCOUNTER — Ambulatory Visit (HOSPITAL_COMMUNITY)
Admission: RE | Admit: 2020-01-06 | Discharge: 2020-01-06 | Disposition: A | Discharge status: Home / Self Care | Payer: 59 | Source: Ambulatory Visit | Attending: Obstetrics and Gynecology | Admitting: Obstetrics and Gynecology

## 2020-01-06 DIAGNOSIS — Z1231 Encounter for screening mammogram for malignant neoplasm of breast: Secondary | ICD-10-CM | POA: Insufficient documentation

## 2020-01-07 ENCOUNTER — Other Ambulatory Visit (HOSPITAL_COMMUNITY): Payer: Self-pay | Admitting: Obstetrics and Gynecology

## 2020-01-07 DIAGNOSIS — R928 Other abnormal and inconclusive findings on diagnostic imaging of breast: Secondary | ICD-10-CM

## 2020-01-09 ENCOUNTER — Other Ambulatory Visit: Payer: Self-pay

## 2020-01-10 ENCOUNTER — Other Ambulatory Visit (HOSPITAL_COMMUNITY): Payer: Self-pay | Admitting: Obstetrics and Gynecology

## 2020-01-10 ENCOUNTER — Ambulatory Visit
Admission: RE | Admit: 2020-01-10 | Discharge: 2020-01-10 | Disposition: A | Discharge status: Home / Self Care | Payer: 59 | Source: Ambulatory Visit | Attending: Obstetrics and Gynecology | Admitting: Obstetrics and Gynecology

## 2020-01-10 ENCOUNTER — Encounter: Payer: Self-pay | Admitting: Internal Medicine

## 2020-01-10 ENCOUNTER — Ambulatory Visit (INDEPENDENT_AMBULATORY_CARE_PROVIDER_SITE_OTHER): Payer: 59 | Admitting: Internal Medicine

## 2020-01-10 VITALS — BP 102/78 | HR 76 | Temp 97.5°F | Ht 65.0 in | Wt 142.8 lb

## 2020-01-10 DIAGNOSIS — R922 Inconclusive mammogram: Secondary | ICD-10-CM | POA: Diagnosis not present

## 2020-01-10 DIAGNOSIS — G43809 Other migraine, not intractable, without status migrainosus: Secondary | ICD-10-CM

## 2020-01-10 DIAGNOSIS — Z0000 Encounter for general adult medical examination without abnormal findings: Secondary | ICD-10-CM | POA: Diagnosis not present

## 2020-01-10 DIAGNOSIS — Z85850 Personal history of malignant neoplasm of thyroid: Secondary | ICD-10-CM

## 2020-01-10 DIAGNOSIS — R921 Mammographic calcification found on diagnostic imaging of breast: Secondary | ICD-10-CM

## 2020-01-10 DIAGNOSIS — R928 Other abnormal and inconclusive findings on diagnostic imaging of breast: Secondary | ICD-10-CM

## 2020-01-10 NOTE — Patient Instructions (Signed)
-Nice seeing you today!!  -Return fasting for lab work.  -Consider shingles vaccination series.  -See you back in 1 year or sooner as needed.   Preventive Care 52-52 Years Old, Female Preventive care refers to visits with your health care provider and lifestyle choices that can promote health and wellness. This includes:  A yearly physical exam. This may also be called an annual well check.  Regular dental visits and eye exams.  Immunizations.  Screening for certain conditions.  Healthy lifestyle choices, such as eating a healthy diet, getting regular exercise, not using drugs or products that contain nicotine and tobacco, and limiting alcohol use. What can I expect for my preventive care visit? Physical exam Your health care provider will check your:  Height and weight. This may be used to calculate body mass index (BMI), which tells if you are at a healthy weight.  Heart rate and blood pressure.  Skin for abnormal spots. Counseling Your health care provider may ask you questions about your:  Alcohol, tobacco, and drug use.  Emotional well-being.  Home and relationship well-being.  Sexual activity.  Eating habits.  Work and work Statistician.  Method of birth control.  Menstrual cycle.  Pregnancy history. What immunizations do I need?  Influenza (flu) vaccine  This is recommended every year. Tetanus, diphtheria, and pertussis (Tdap) vaccine  You may need a Td booster every 10 years. Varicella (chickenpox) vaccine  You may need this if you have not been vaccinated. Zoster (shingles) vaccine  You may need this after age 21. Measles, mumps, and rubella (MMR) vaccine  You may need at least one dose of MMR if you were born in 1957 or later. You may also need a second dose. Pneumococcal conjugate (PCV13) vaccine  You may need this if you have certain conditions and were not previously vaccinated. Pneumococcal polysaccharide (PPSV23) vaccine  You may  need one or two doses if you smoke cigarettes or if you have certain conditions. Meningococcal conjugate (MenACWY) vaccine  You may need this if you have certain conditions. Hepatitis A vaccine  You may need this if you have certain conditions or if you travel or work in places where you may be exposed to hepatitis A. Hepatitis B vaccine  You may need this if you have certain conditions or if you travel or work in places where you may be exposed to hepatitis B. Haemophilus influenzae type b (Hib) vaccine  You may need this if you have certain conditions. Human papillomavirus (HPV) vaccine  If recommended by your health care provider, you may need three doses over 6 months. You may receive vaccines as individual doses or as more than one vaccine together in one shot (combination vaccines). Talk with your health care provider about the risks and benefits of combination vaccines. What tests do I need? Blood tests  Lipid and cholesterol levels. These may be checked every 5 years, or more frequently if you are over 74 years old.  Hepatitis C test.  Hepatitis B test. Screening  Lung cancer screening. You may have this screening every year starting at age 49 if you have a 30-pack-year history of smoking and currently smoke or have quit within the past 15 years.  Colorectal cancer screening. All adults should have this screening starting at age 74 and continuing until age 72. Your health care provider may recommend screening at age 41 if you are at increased risk. You will have tests every 1-10 years, depending on your results and the type  of screening test.  Diabetes screening. This is done by checking your blood sugar (glucose) after you have not eaten for a while (fasting). You may have this done every 1-3 years.  Mammogram. This may be done every 1-2 years. Talk with your health care provider about when you should start having regular mammograms. This may depend on whether you have a  family history of breast cancer.  BRCA-related cancer screening. This may be done if you have a family history of breast, ovarian, tubal, or peritoneal cancers.  Pelvic exam and Pap test. This may be done every 3 years starting at age 70. Starting at age 52, this may be done every 5 years if you have a Pap test in combination with an HPV test. Other tests  Sexually transmitted disease (STD) testing.  Bone density scan. This is done to screen for osteoporosis. You may have this scan if you are at high risk for osteoporosis. Follow these instructions at home: Eating and drinking  Eat a diet that includes fresh fruits and vegetables, whole grains, lean protein, and low-fat dairy.  Take vitamin and mineral supplements as recommended by your health care provider.  Do not drink alcohol if: ? Your health care provider tells you not to drink. ? You are pregnant, may be pregnant, or are planning to become pregnant.  If you drink alcohol: ? Limit how much you have to 0-1 drink a day. ? Be aware of how much alcohol is in your drink. In the [place] one drink equals one 12 oz bottle of beer (355 mL), one 5 oz glass of wine (148 mL), or one 1 oz glass of hard liquor (44 mL). Lifestyle  Take daily care of your teeth and gums.  Stay active. Exercise for at least 30 minutes on 5 or more days each week.  Do not use any products that contain nicotine or tobacco, such as cigarettes, e-cigarettes, and chewing tobacco. If you need help quitting, ask your health care provider.  If you are sexually active, practice safe sex. Use a condom or other form of birth control (contraception) in order to prevent pregnancy and STIs (sexually transmitted infections).  If told by your health care provider, take low-dose aspirin daily starting at age 30. What's next?  Visit your health care provider once a year for a well check visit.  Ask your health care provider how often you should have your eyes and teeth  checked.  Stay up to date on all vaccines. This information is not intended to replace advice given to you by your health care provider. Make sure you discuss any questions you have with your health care provider. Document Revised: 05/24/2018 Document Reviewed: 05/24/2018 Elsevier Patient Education  2020 [employer].

## 2020-01-10 NOTE — Progress Notes (Signed)
Established Patient Office Visit     This visit occurred during the SARS-CoV-2 public health emergency.  Safety protocols were in place, including screening questions prior to the visit, additional usage of staff PPE, and extensive cleaning of exam room while observing appropriate contact time as indicated for disinfecting solutions.    CC/Reason for Visit: Annual preventive exam  HPI: Gwendolyn Flores is a 54 y.o. female who is coming in today for the above mentioned reasons. 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  She has no complaints today.  She has received both Covid vaccinations.  She has routine eye and dental care.  Her colonoscopy, mammogram and cervical cancer screenings are up-to-date.   Past Medical/Surgical History: Past Medical History:  Diagnosis Date  . Arthritis   . Cancer [facility])    thyroid cancer  . Chicken pox   . Headache   . High cholesterol   . History of thyroid cancer 06/28/2013  . Rectocele 06/28/2013  . Thyroid disease    cancer  . Urinary tract infection     Past Surgical History:  Procedure Laterality Date  . THYROIDECTOMY    . TONSILECTOMY, ADENOIDECTOMY, BILATERAL MYRINGOTOMY AND TUBES      Social History:  reports that she has never smoked. She has never used smokeless tobacco. She reports current alcohol use. She reports that she does not use drugs.  Allergies: Allergies  Allergen Reactions  . Codeine Nausea And Vomiting  . Levaquin [Levofloxacin In D5w] Other (See Comments)    A fib; abnormal heart rhythm  . Sulfa Antibiotics Nausea And Vomiting    Family History:  Family History  Problem Relation Age of Onset  . Heart disease Father        heart attack  . Diabetes Brother   . Migraines Neg Hx      Current Outpatient  Medications:  .  Ascorbic Acid (VITAMIN C) 1000 MG tablet, Take 1,000 mg by mouth daily., Disp: , Rfl:  .  aspirin-acetaminophen-caffeine (EXCEDRIN MIGRAINE) 250-250-65 MG tablet, Take 1 tablet by mouth every 6 (six) hours as needed for headache. Reported on 12/14/2015, Disp: , Rfl:  .  Calcium-Magnesium-Vitamin D (CALCIUM MAGNESIUM PO), Take 3 tablets by mouth daily., Disp: , Rfl:  .  Diclofenac Potassium,Migraine, 50 MG PACK, Take 50 mg by mouth once as needed. For migraine., Disp: 9 each, Rfl: 11 .  Galcanezumab-gnlm (EMGALITY) 120 MG/ML SOAJ, Inject 120 mg into the skin every 30 (thirty) days., Disp: 1 pen, Rfl: 11 .  ibuprofen (ADVIL,MOTRIN) 200 MG tablet, Take 200 mg by mouth as needed., Disp: , Rfl:  .  naproxen (NAPROSYN) 500 MG tablet, Take with Imitrex for migraine or vertigo., Disp: 60 tablet, Rfl: 12 .  Rimegepant Sulfate (NURTEC) 75 MG TBDP, Take 75 mg by mouth daily as needed. For migraines. Take as close to onset of migraine as possible. One daily maximum., Disp: 10 tablet, Rfl: 6 .  SUMAtriptan (IMITREX) 100 MG tablet, TAKE 1 TABLET BY MOUTH ONCE AS NEEDED. MAY REPEAT IN 2 HOURS IF HEADACHE PERSISTS OR RECURS. MAX 2 TAB/DAY. NEED YEARLY FOLLOW-UP., Disp: 10 tablet, Rfl: 0 .  SYNTHROID 125 MCG tablet, Take 100 mcg by mouth daily before breakfast., Disp: , Rfl:  .  VITAMIN D PO, Take by mouth daily., Disp: , Rfl:   Review  of Systems:  Constitutional: Denies fever, chills, diaphoresis, appetite change and fatigue.  HEENT: Denies photophobia, eye pain, redness, hearing loss, ear pain, congestion, sore throat, rhinorrhea, sneezing, mouth sores, trouble swallowing, neck pain, neck stiffness and tinnitus.   Respiratory: Denies SOB, DOE, cough, chest tightness,  and wheezing.   Cardiovascular: Denies chest pain, palpitations and leg swelling.  Gastrointestinal: Denies nausea, vomiting, abdominal pain, diarrhea, constipation, blood in stool and abdominal distention.  Genitourinary: Denies  dysuria, urgency, frequency, hematuria, flank pain and difficulty urinating.  Endocrine: Denies: hot or cold intolerance, sweats, changes in hair or nails, polyuria, polydipsia. Musculoskeletal: Denies myalgias, back pain, joint swelling, arthralgias and gait problem.  Skin: Denies pallor, rash and wound.  Neurological: Denies dizziness, seizures, syncope, weakness, light-headedness, numbness and headaches.  Hematological: Denies adenopathy. Easy bruising, personal or family bleeding history  Psychiatric/Behavioral: Denies suicidal ideation, mood changes, confusion, nervousness, sleep disturbance and agitation    Physical Exam: Vitals:   01/10/20 1017  BP: 102/78  Pulse: 76  Temp: (!) 97.5 F (36.4 C)  TempSrc: Temporal  SpO2: 99%  Weight: 142 lb 12.8 oz (64.8 kg)  Height: 5' 5"  (1.651 m)    Body mass index is 23.76 kg/m.   Constitutional: NAD, calm, comfortable Eyes: PERRL, lids and conjunctivae normal ENMT: Mucous membranes are moist.  Tympanic membrane is pearly white, no erythema or bulging. Neck: normal, supple, no masses, no thyromegaly Respiratory: clear to auscultation bilaterally, no wheezing, no crackles. Normal respiratory effort. No accessory muscle use.  Cardiovascular: Regular rate and rhythm, no murmurs / rubs / gallops. No extremity edema.  Abdomen: no tenderness, no masses palpated. No hepatosplenomegaly. Bowel sounds positive.  Musculoskeletal: no clubbing / cyanosis. No joint deformity upper and lower extremities. Good ROM, no contractures. Normal muscle tone.  Skin: no rashes, lesions, ulcers. No induration Neurologic: CN 2-12 grossly intact. Sensation intact, DTR normal. Strength 5/5 in all 4.  Psychiatric: Normal judgment and insight. Alert and oriented x 3. Normal mood.    Impression and Plan:  Encounter for preventive health examination -She has routine eye and dental care. -All vaccinations are up-to-date including Covid with the exception of  shingles which she will consider for her next visit. -Screening labs today. -Healthy lifestyle discussed in detail. -Her mammogram is updated. -She had a colonoscopy in 2020 and is a 7-year callback. -She follows with GYN for cervical cancer screening.  History of thyroid cancer -Followed by endocrinology, on levothyroxine.  Vestibular migraine -Followed by neurology, well-controlled.    Patient Instructions  -Nice seeing you today!!  -Return fasting for lab work.  -Consider shingles vaccination series.  -See you back in 1 year or sooner as needed.   Preventive Care 44-56 Years Old, Female Preventive care refers to visits with your health care provider and lifestyle choices that can promote health and wellness. This includes:  A yearly physical exam. This may also be called an annual well check.  Regular dental visits and eye exams.  Immunizations.  Screening for certain conditions.  Healthy lifestyle choices, such as eating a healthy diet, getting regular exercise, not using drugs or products that contain nicotine and tobacco, and limiting alcohol use. What can I expect for my preventive care visit? Physical exam Your health care provider will check your:  Height and weight. This may be used to calculate body mass index (BMI), which tells if you are at a healthy weight.  Heart rate and blood pressure.  Skin for abnormal spots. Counseling Your health care provider  may ask you questions about your:  Alcohol, tobacco, and drug use.  Emotional well-being.  Home and relationship well-being.  Sexual activity.  Eating habits.  Work and work Statistician.  Method of birth control.  Menstrual cycle.  Pregnancy history. What immunizations do I need?  Influenza (flu) vaccine  This is recommended every year. Tetanus, diphtheria, and pertussis (Tdap) vaccine  You may need a Td booster every 10 years. Varicella (chickenpox) vaccine  You may need this if  you have not been vaccinated. Zoster (shingles) vaccine  You may need this after age 59. Measles, mumps, and rubella (MMR) vaccine  You may need at least one dose of MMR if you were born in 1957 or later. You may also need a second dose. Pneumococcal conjugate (PCV13) vaccine  You may need this if you have certain conditions and were not previously vaccinated. Pneumococcal polysaccharide (PPSV23) vaccine  You may need one or two doses if you smoke cigarettes or if you have certain conditions. Meningococcal conjugate (MenACWY) vaccine  You may need this if you have certain conditions. Hepatitis A vaccine  You may need this if you have certain conditions or if you travel or work in places where you may be exposed to hepatitis A. Hepatitis B vaccine  You may need this if you have certain conditions or if you travel or work in places where you may be exposed to hepatitis B. Haemophilus influenzae type b (Hib) vaccine  You may need this if you have certain conditions. Human papillomavirus (HPV) vaccine  If recommended by your health care provider, you may need three doses over 6 months. You may receive vaccines as individual doses or as more than one vaccine together in one shot (combination vaccines). Talk with your health care provider about the risks and benefits of combination vaccines. What tests do I need? Blood tests  Lipid and cholesterol levels. These may be checked every 5 years, or more frequently if you are over 48 years old.  Hepatitis C test.  Hepatitis B test. Screening  Lung cancer screening. You may have this screening every year starting at age 8 if you have a 30-pack-year history of smoking and currently smoke or have quit within the past 15 years.  Colorectal cancer screening. All adults should have this screening starting at age 48 and continuing until age 65. Your health care provider may recommend screening at age 73 if you are at increased risk. You will  have tests every 1-10 years, depending on your results and the type of screening test.  Diabetes screening. This is done by checking your blood sugar (glucose) after you have not eaten for a while (fasting). You may have this done every 1-3 years.  Mammogram. This may be done every 1-2 years. Talk with your health care provider about when you should start having regular mammograms. This may depend on whether you have a family history of breast cancer.  BRCA-related cancer screening. This may be done if you have a family history of breast, ovarian, tubal, or peritoneal cancers.  Pelvic exam and Pap test. This may be done every 3 years starting at age 32. Starting at age 3, this may be done every 5 years if you have a Pap test in combination with an HPV test. Other tests  Sexually transmitted disease (STD) testing.  Bone density scan. This is done to screen for osteoporosis. You may have this scan if you are at high risk for osteoporosis. Follow these instructions at home:  Eating and drinking  Eat a diet that includes fresh fruits and vegetables, whole grains, lean protein, and low-fat dairy.  Take vitamin and mineral supplements as recommended by your health care provider.  Do not drink alcohol if: ? Your health care provider tells you not to drink. ? You are pregnant, may be pregnant, or are planning to become pregnant.  If you drink alcohol: ? Limit how much you have to 0-1 drink a day. ? Be aware of how much alcohol is in your drink. In the [place] one drink equals one 12 oz bottle of beer (355 mL), one 5 oz glass of wine (148 mL), or one 1 oz glass of hard liquor (44 mL). Lifestyle  Take daily care of your teeth and gums.  Stay active. Exercise for at least 30 minutes on 5 or more days each week.  Do not use any products that contain nicotine or tobacco, such as cigarettes, e-cigarettes, and chewing tobacco. If you need help quitting, ask your health care provider.  If you are  sexually active, practice safe sex. Use a condom or other form of birth control (contraception) in order to prevent pregnancy and STIs (sexually transmitted infections).  If told by your health care provider, take low-dose aspirin daily starting at age 27. What's next?  Visit your health care provider once a year for a well check visit.  Ask your health care provider how often you should have your eyes and teeth checked.  Stay up to date on all vaccines. This information is not intended to replace advice given to you by your health care provider. Make sure you discuss any questions you have with your health care provider. Document Revised: 05/24/2018 Document Reviewed: 05/24/2018 Elsevier Patient Education  2020 [place], [place] [facility]

## 2020-01-13 ENCOUNTER — Other Ambulatory Visit: Payer: Self-pay

## 2020-01-13 ENCOUNTER — Other Ambulatory Visit (INDEPENDENT_AMBULATORY_CARE_PROVIDER_SITE_OTHER): Payer: 59

## 2020-01-13 DIAGNOSIS — Z0000 Encounter for general adult medical examination without abnormal findings: Secondary | ICD-10-CM

## 2020-01-13 LAB — BASIC METABOLIC PANEL
BUN: 13 mg/dL (ref 6–23)
CO2: 28 mEq/L (ref 19–32)
Calcium: 9.4 mg/dL (ref 8.4–10.5)
Chloride: 104 mEq/L (ref 96–112)
Creatinine, Ser: 0.85 mg/dL (ref 0.40–1.20)
GFR: 70.31 mL/min (ref >60.00)
Glucose, Bld: 71 mg/dL (ref 70–99)
Potassium: 3.9 mEq/L (ref 3.5–5.1)
Sodium: 139 mEq/L (ref 135–145)

## 2020-01-13 LAB — CBC WITH DIFFERENTIAL/PLATELET
Basophils Absolute: 0 10*3/uL (ref 0.0–0.1)
Basophils Relative: 0.5 % (ref 0.0–3.0)
Eosinophils Absolute: 0.1 10*3/uL (ref 0.0–0.7)
Eosinophils Relative: 1.2 % (ref 0.0–5.0)
HCT: 41 % (ref 36.0–46.0)
Hemoglobin: 14 g/dL (ref 12.0–15.0)
Lymphocytes Relative: 45.2 % (ref 12.0–46.0)
Lymphs Abs: 2.1 10*3/uL (ref 0.7–4.0)
MCHC: 34.1 g/dL (ref 30.0–36.0)
MCV: 95.9 fl (ref 78.0–100.0)
Monocytes Absolute: 0.3 10*3/uL (ref 0.1–1.0)
Monocytes Relative: 7.1 % (ref 3.0–12.0)
Neutro Abs: 2.1 10*3/uL (ref 1.4–7.7)
Neutrophils Relative %: 46 % (ref 43.0–77.0)
Platelets: 231 10*3/uL (ref 150.0–400.0)
RBC: 4.27 Mil/uL (ref 3.87–5.11)
RDW: 12.8 % (ref 11.5–15.5)
WBC: 4.6 10*3/uL (ref 4.0–10.5)

## 2020-01-13 LAB — HEPATIC FUNCTION PANEL
ALT: 18 U/L (ref 0–35)
AST: 18 U/L (ref 0–37)
Albumin: 4.2 g/dL (ref 3.5–5.2)
Alkaline Phosphatase: 62 U/L (ref 39–117)
Bilirubin, Direct: 0.1 mg/dL (ref 0.0–0.3)
Total Bilirubin: 0.5 mg/dL (ref 0.2–1.2)
Total Protein: 6.5 g/dL (ref 6.0–8.3)

## 2020-01-13 LAB — LIPID PANEL
Cholesterol: 220 mg/dL — ABNORMAL HIGH (ref 0–200)
HDL: 61.7 mg/dL (ref >39.00)
LDL Cholesterol: 142 mg/dL — ABNORMAL HIGH (ref 0–99)
NonHDL: 158.65
Total CHOL/HDL Ratio: 4
Triglycerides: 84 mg/dL (ref 0.0–149.0)
VLDL: 16.8 mg/dL (ref 0.0–40.0)

## 2020-01-13 LAB — VITAMIN D 25 HYDROXY (VIT D DEFICIENCY, FRACTURES): VITD: 46.95 ng/mL (ref 30.00–100.00)

## 2020-01-16 ENCOUNTER — Telehealth: Payer: Self-pay | Admitting: *Deleted

## 2020-01-16 NOTE — Telephone Encounter (Signed)
Patient has a Botox appointment on 02/03/2020.  I called [facility] and spoke to [facility] .  She states that [id number] and [phone] are valid and billable.  They do not require PA.  Ref# for this call is [id number]

## 2020-01-17 MED FILL — EMGALITY 120 MG/ML SOAJ: 120 | 30 days supply | Qty: 1 | Fill #3

## 2020-01-20 ENCOUNTER — Ambulatory Visit
Admission: RE | Admit: 2020-01-20 | Discharge: 2020-01-20 | Disposition: A | Discharge status: Home / Self Care | Payer: 59 | Source: Ambulatory Visit | Attending: Obstetrics and Gynecology | Admitting: Obstetrics and Gynecology

## 2020-01-20 ENCOUNTER — Other Ambulatory Visit: Payer: Self-pay

## 2020-01-20 DIAGNOSIS — D0512 Intraductal carcinoma in situ of left breast: Secondary | ICD-10-CM | POA: Diagnosis not present

## 2020-01-20 DIAGNOSIS — M9904 Segmental and somatic dysfunction of sacral region: Secondary | ICD-10-CM | POA: Diagnosis not present

## 2020-01-20 DIAGNOSIS — R921 Mammographic calcification found on diagnostic imaging of breast: Secondary | ICD-10-CM

## 2020-01-20 DIAGNOSIS — M546 Pain in thoracic spine: Secondary | ICD-10-CM | POA: Diagnosis not present

## 2020-01-20 DIAGNOSIS — M9905 Segmental and somatic dysfunction of pelvic region: Secondary | ICD-10-CM | POA: Diagnosis not present

## 2020-01-20 DIAGNOSIS — M9903 Segmental and somatic dysfunction of lumbar region: Secondary | ICD-10-CM | POA: Diagnosis not present

## 2020-01-20 DIAGNOSIS — M545 Low back pain: Secondary | ICD-10-CM | POA: Diagnosis not present

## 2020-01-20 DIAGNOSIS — M9902 Segmental and somatic dysfunction of thoracic region: Secondary | ICD-10-CM | POA: Diagnosis not present

## 2020-01-20 DIAGNOSIS — M542 Cervicalgia: Secondary | ICD-10-CM | POA: Diagnosis not present

## 2020-01-20 DIAGNOSIS — M9901 Segmental and somatic dysfunction of cervical region: Secondary | ICD-10-CM | POA: Diagnosis not present

## 2020-01-22 ENCOUNTER — Other Ambulatory Visit (HOSPITAL_COMMUNITY): Payer: Self-pay | Admitting: General Surgery

## 2020-01-22 DIAGNOSIS — N63 Unspecified lump in unspecified breast: Secondary | ICD-10-CM

## 2020-01-22 DIAGNOSIS — D0512 Intraductal carcinoma in situ of left breast: Secondary | ICD-10-CM

## 2020-01-22 NOTE — Telephone Encounter (Signed)
Pt called stating that she is having surgery on 02/03/20 and is needing to move her appt to the end of the week of the 10th or if it is not possible then the very next Monday available. Please advise.

## 2020-01-23 NOTE — Telephone Encounter (Signed)
Gwendolyn Flores will you please look at the schedule and advise.  There are no openings that week and the next available Monday will push her out til June.

## 2020-01-26 NOTE — Telephone Encounter (Signed)
5/12 4pm would be fine!

## 2020-01-27 NOTE — Telephone Encounter (Signed)
I called patient and got no answer but left her a message that we could see her on 02/05/2020 at 4PM please call is this will not work.

## 2020-01-29 DIAGNOSIS — D0512 Intraductal carcinoma in situ of left breast: Secondary | ICD-10-CM

## 2020-01-30 ENCOUNTER — Telehealth: Payer: Self-pay | Admitting: Hematology and Oncology

## 2020-01-30 NOTE — Telephone Encounter (Signed)
Received a new pt referral from dr. 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 Gwendolyn Flores has been scheduled to see Dr. [name] on 5/24 at 1pm. Appointment date and time has been given to [facility] from the referring office who will notify the pt.

## 2020-01-31 ENCOUNTER — Other Ambulatory Visit (HOSPITAL_COMMUNITY)
Admission: RE | Admit: 2020-01-31 | Discharge: 2020-01-31 | Disposition: A | Discharge status: Home / Self Care | Payer: 59 | Source: Ambulatory Visit | Attending: General Surgery | Admitting: General Surgery

## 2020-01-31 ENCOUNTER — Encounter (HOSPITAL_COMMUNITY)
Admission: RE | Admit: 2020-01-31 | Discharge: 2020-01-31 | Disposition: A | Discharge status: Home / Self Care | Payer: 59 | Source: Ambulatory Visit | Attending: General Surgery | Admitting: General Surgery

## 2020-01-31 ENCOUNTER — Other Ambulatory Visit: Payer: Self-pay

## 2020-01-31 DIAGNOSIS — E890 Postprocedural hypothyroidism: Secondary | ICD-10-CM | POA: Diagnosis not present

## 2020-01-31 DIAGNOSIS — C73 Malignant neoplasm of thyroid gland: Secondary | ICD-10-CM | POA: Diagnosis not present

## 2020-01-31 DIAGNOSIS — Z20822 Contact with and (suspected) exposure to covid-19: Secondary | ICD-10-CM | POA: Diagnosis not present

## 2020-01-31 DIAGNOSIS — Z01812 Encounter for preprocedural laboratory examination: Secondary | ICD-10-CM | POA: Insufficient documentation

## 2020-01-31 DIAGNOSIS — Z833 Family history of diabetes mellitus: Secondary | ICD-10-CM | POA: Diagnosis not present

## 2020-02-01 LAB — SARS CORONAVIRUS 2 (TAT 6-24 HRS): SARS Coronavirus 2: NEGATIVE

## 2020-02-03 ENCOUNTER — Ambulatory Visit (HOSPITAL_COMMUNITY): Payer: 59

## 2020-02-03 ENCOUNTER — Ambulatory Visit (HOSPITAL_COMMUNITY): Payer: 59 | Admitting: Anesthesiology

## 2020-02-03 ENCOUNTER — Encounter (HOSPITAL_COMMUNITY)
Admission: RE | Disposition: A | Discharge status: Home / Self Care | Payer: Self-pay | Source: Home / Self Care | Attending: General Surgery

## 2020-02-03 ENCOUNTER — Other Ambulatory Visit: Payer: Self-pay

## 2020-02-03 ENCOUNTER — Encounter (HOSPITAL_COMMUNITY): Payer: Self-pay | Admitting: General Surgery

## 2020-02-03 ENCOUNTER — Encounter (HOSPITAL_COMMUNITY)
Admission: RE | Admit: 2020-02-03 | Discharge: 2020-02-03 | Disposition: A | Discharge status: Home / Self Care | Payer: 59 | Source: Ambulatory Visit | Attending: General Surgery | Admitting: General Surgery

## 2020-02-03 ENCOUNTER — Ambulatory Visit: Payer: Self-pay | Admitting: Neurology

## 2020-02-03 ENCOUNTER — Ambulatory Visit (HOSPITAL_COMMUNITY)
Admission: RE | Admit: 2020-02-03 | Discharge: 2020-02-03 | Disposition: A | Discharge status: Home / Self Care | Payer: 59 | Source: Ambulatory Visit | Attending: General Surgery | Admitting: General Surgery

## 2020-02-03 ENCOUNTER — Ambulatory Visit (HOSPITAL_COMMUNITY)
Admission: RE | Admit: 2020-02-03 | Discharge: 2020-02-03 | Disposition: A | Discharge status: Home / Self Care | Payer: 59 | Attending: General Surgery | Admitting: General Surgery

## 2020-02-03 DIAGNOSIS — Z85850 Personal history of malignant neoplasm of thyroid: Secondary | ICD-10-CM | POA: Diagnosis not present

## 2020-02-03 DIAGNOSIS — M1990 Unspecified osteoarthritis, unspecified site: Secondary | ICD-10-CM | POA: Diagnosis not present

## 2020-02-03 DIAGNOSIS — Z803 Family history of malignant neoplasm of breast: Secondary | ICD-10-CM | POA: Insufficient documentation

## 2020-02-03 DIAGNOSIS — Z8249 Family history of ischemic heart disease and other diseases of the circulatory system: Secondary | ICD-10-CM | POA: Diagnosis not present

## 2020-02-03 DIAGNOSIS — R519 Headache, unspecified: Secondary | ICD-10-CM | POA: Diagnosis not present

## 2020-02-03 DIAGNOSIS — E890 Postprocedural hypothyroidism: Secondary | ICD-10-CM | POA: Diagnosis not present

## 2020-02-03 DIAGNOSIS — E7800 Pure hypercholesterolemia, unspecified: Secondary | ICD-10-CM | POA: Diagnosis not present

## 2020-02-03 DIAGNOSIS — Z833 Family history of diabetes mellitus: Secondary | ICD-10-CM | POA: Diagnosis not present

## 2020-02-03 DIAGNOSIS — D0512 Intraductal carcinoma in situ of left breast: Secondary | ICD-10-CM

## 2020-02-03 DIAGNOSIS — N63 Unspecified lump in unspecified breast: Secondary | ICD-10-CM

## 2020-02-03 HISTORY — PX: PARTIAL MASTECTOMY WITH NEEDLE LOCALIZATION AND AXILLARY SENTINEL LYMPH NODE BX: SHX6009

## 2020-02-03 SURGERY — PARTIAL MASTECTOMY WITH NEEDLE LOCALIZATION AND AXILLARY SENTINEL LYMPH NODE BX
Anesthesia: General | Site: Breast | Laterality: Left

## 2020-02-03 MED ORDER — EPHEDRINE SULFATE-NACL 50-0.9 MG/10ML-% IV SOSY
PREFILLED_SYRINGE | INTRAVENOUS | Status: DC | PRN
  Administered 2020-02-03: 5 mg via INTRAVENOUS

## 2020-02-03 MED ORDER — PROPOFOL 10 MG/ML IV BOLUS
INTRAVENOUS | Status: DC | PRN
  Administered 2020-02-03: 150 mg via INTRAVENOUS

## 2020-02-03 MED ORDER — DOCUSATE SODIUM 100 MG PO CAPS
100.0000 mg | ORAL_CAPSULE | Freq: Two times a day (BID) | ORAL | 2 refills | Status: DC
Start: 2020-02-03 — End: 2020-02-20

## 2020-02-03 MED ORDER — FENTANYL CITRATE (PF) 100 MCG/2ML IJ SOLN
INTRAMUSCULAR | Status: AC
  Filled 2020-02-03: qty 2

## 2020-02-03 MED ORDER — MIDAZOLAM HCL 2 MG/2ML IJ SOLN
INTRAMUSCULAR | Status: AC
  Filled 2020-02-03: qty 2

## 2020-02-03 MED ORDER — LIDOCAINE 2% (20 MG/ML) 5 ML SYRINGE
INTRAMUSCULAR | Status: DC | PRN
  Administered 2020-02-03: 60 mg via INTRAVENOUS

## 2020-02-03 MED ORDER — DEXAMETHASONE SODIUM PHOSPHATE 4 MG/ML IJ SOLN
INTRAMUSCULAR | Status: DC | PRN
  Administered 2020-02-03: 10 mg via INTRAVENOUS

## 2020-02-03 MED ORDER — ONDANSETRON HCL 4 MG/2ML IJ SOLN
INTRAMUSCULAR | Status: AC
  Filled 2020-02-03: qty 2

## 2020-02-03 MED ORDER — SODIUM CHLORIDE (PF) 0.9 % IJ SOLN
INTRAMUSCULAR | Status: DC | PRN
  Administered 2020-02-03: 3 mL

## 2020-02-03 MED ORDER — SODIUM CHLORIDE (PF) 0.9 % IJ SOLN
INTRAMUSCULAR | Status: AC
  Filled 2020-02-03: qty 10

## 2020-02-03 MED ORDER — ONDANSETRON HCL 4 MG/2ML IJ SOLN
INTRAMUSCULAR | Status: DC | PRN
  Administered 2020-02-03: 4 mg via INTRAVENOUS

## 2020-02-03 MED ORDER — LIDOCAINE HCL (PF) 2 % IJ SOLN
INTRAMUSCULAR | Status: AC
  Filled 2020-02-03: qty 10

## 2020-02-03 MED ORDER — LACTATED RINGERS IV SOLN
INTRAVENOUS | Status: DC | PRN
Start: 2020-02-03 — End: 2020-02-03

## 2020-02-03 MED ORDER — PENTAFLUOROPROP-TETRAFLUOROETH EX AERO
INHALATION_SPRAY | CUTANEOUS | Status: AC
  Filled 2020-02-03: qty 116

## 2020-02-03 MED ORDER — METHYLENE BLUE 0.5 % INJ SOLN
INTRAVENOUS | Status: DC | PRN
  Administered 2020-02-03: 1 mL via INTRADERMAL

## 2020-02-03 MED ORDER — BUPIVACAINE HCL (PF) 0.5 % IJ SOLN
INTRAMUSCULAR | Status: AC
  Filled 2020-02-03: qty 30

## 2020-02-03 MED ORDER — LACTATED RINGERS IV SOLN: Freq: Once | INTRAVENOUS | Status: AC

## 2020-02-03 MED ORDER — LIDOCAINE 2% (20 MG/ML) 5 ML SYRINGE
INTRAMUSCULAR | Status: AC
  Filled 2020-02-03: qty 5

## 2020-02-03 MED ORDER — TECHNETIUM TC 99M SULFUR COLLOID FILTERED
1.0000 | Freq: Once | INTRAVENOUS | Status: AC | PRN
  Administered 2020-02-03: 1 via INTRADERMAL

## 2020-02-03 MED ORDER — LIDOCAINE HCL (CARDIAC) PF 100 MG/5ML IV SOSY: PREFILLED_SYRINGE | INTRAVENOUS | Status: DC | PRN

## 2020-02-03 MED ORDER — HYDROMORPHONE HCL 1 MG/ML IJ SOLN: 0.2500 mg | INTRAMUSCULAR | Status: DC | PRN

## 2020-02-03 MED ORDER — SCOPOLAMINE 1 MG/3DAYS TD PT72
1.0000 | MEDICATED_PATCH | Freq: Once | TRANSDERMAL | Status: DC
  Administered 2020-02-03: 1.5 mg via TRANSDERMAL

## 2020-02-03 MED ORDER — METHYLENE BLUE 0.5 % INJ SOLN
INTRAVENOUS | Status: AC
  Filled 2020-02-03: qty 10

## 2020-02-03 MED ORDER — CHLORHEXIDINE GLUCONATE CLOTH 2 % EX PADS: 6.0000 | MEDICATED_PAD | Freq: Once | CUTANEOUS | Status: DC

## 2020-02-03 MED ORDER — OXYCODONE HCL 5 MG PO TABS: 5.0000 mg | ORAL_TABLET | ORAL | 0 refills | Status: DC | PRN

## 2020-02-03 MED ORDER — SCOPOLAMINE 1 MG/3DAYS TD PT72
MEDICATED_PATCH | TRANSDERMAL | Status: AC
  Filled 2020-02-03: qty 1

## 2020-02-03 MED ORDER — 0.9 % SODIUM CHLORIDE (POUR BTL) OPTIME
TOPICAL | Status: DC | PRN
  Administered 2020-02-03: 1000 mL

## 2020-02-03 MED ORDER — MIDAZOLAM HCL 2 MG/2ML IJ SOLN
2.0000 mg | Freq: Once | INTRAMUSCULAR | Status: AC
  Administered 2020-02-03: 2 mg via INTRAVENOUS

## 2020-02-03 MED ORDER — PROMETHAZINE HCL 25 MG/ML IJ SOLN
6.2500 mg | INTRAMUSCULAR | Status: DC | PRN
  Administered 2020-02-03: 6.25 mg via INTRAVENOUS
  Filled 2020-02-03: qty 1

## 2020-02-03 MED ORDER — FENTANYL CITRATE (PF) 100 MCG/2ML IJ SOLN
INTRAMUSCULAR | Status: DC | PRN
  Administered 2020-02-03: 50 ug via INTRAVENOUS
  Administered 2020-02-03 (×5): 25 ug via INTRAVENOUS

## 2020-02-03 MED ORDER — CEFAZOLIN SODIUM-DEXTROSE 2-4 GM/100ML-% IV SOLN
2.0000 g | INTRAVENOUS | Status: AC
  Administered 2020-02-03: 2 g via INTRAVENOUS
  Filled 2020-02-03: qty 100

## 2020-02-03 MED ORDER — MEPERIDINE HCL 50 MG/ML IJ SOLN: 6.2500 mg | INTRAMUSCULAR | Status: DC | PRN

## 2020-02-03 MED ORDER — ONDANSETRON HCL 4 MG PO TABS: 4.0000 mg | ORAL_TABLET | Freq: Three times a day (TID) | ORAL | 1 refills | Status: DC | PRN

## 2020-02-03 MED ORDER — DEXAMETHASONE SODIUM PHOSPHATE 10 MG/ML IJ SOLN
INTRAMUSCULAR | Status: AC
  Filled 2020-02-03: qty 1

## 2020-02-03 MED ORDER — BUPIVACAINE HCL (PF) 0.5 % IJ SOLN
INTRAMUSCULAR | Status: DC | PRN
  Administered 2020-02-03: 17 mL

## 2020-02-03 SURGICAL SUPPLY — 44 items
ADH SKN CLS APL DERMABOND .7 (GAUZE/BANDAGES/DRESSINGS) ×1
APL PRP STRL LF DISP 70% ISPRP (MISCELLANEOUS) ×1
APPLIER CLIP 9.375 SM OPEN (CLIP) ×6
APR CLP SM 9.3 20 MLT OPN (CLIP) ×2
CHLORAPREP W/TINT 26 (MISCELLANEOUS) ×3 IMPLANT
CLIP APPLIE 9.375 SM OPEN (CLIP) ×1 IMPLANT
CLOTH BEACON ORANGE TIMEOUT ST (SAFETY) ×3 IMPLANT
CONT SPEC 4OZ CLIKSEAL STRL BL (MISCELLANEOUS) ×3 IMPLANT
COVER LIGHT HANDLE STERIS (MISCELLANEOUS) ×6 IMPLANT
COVER PROBE W GEL 5X96 (DRAPES) ×3 IMPLANT
COVER WAND RF STERILE (DRAPES) ×3 IMPLANT
DECANTER SPIKE VIAL GLASS SM (MISCELLANEOUS) ×3 IMPLANT
DERMABOND ADVANCED (GAUZE/BANDAGES/DRESSINGS) ×2
DERMABOND ADVANCED .7 DNX12 (GAUZE/BANDAGES/DRESSINGS) ×1 IMPLANT
DRAPE UTILITY W/TAPE 26X15 (DRAPES) ×4 IMPLANT
ELECT REM PT RETURN 9FT ADLT (ELECTROSURGICAL) ×3
ELECTRODE REM PT RTRN 9FT ADLT (ELECTROSURGICAL) ×1 IMPLANT
GLOVE BIO SURGEON STRL SZ 6.5 (GLOVE) ×3 IMPLANT
GLOVE BIO SURGEONS STRL SZ 6.5 (GLOVE) ×2
GLOVE BIOGEL PI IND STRL 6.5 (GLOVE) ×1 IMPLANT
GLOVE BIOGEL PI IND STRL 7.0 (GLOVE) ×3 IMPLANT
GLOVE BIOGEL PI INDICATOR 6.5 (GLOVE) ×4
GLOVE BIOGEL PI INDICATOR 7.0 (GLOVE) ×6
GLOVE ECLIPSE 6.5 STRL STRAW (GLOVE) ×2 IMPLANT
GOWN STRL REUS W/TWL LRG LVL3 (GOWN DISPOSABLE) ×9 IMPLANT
INST SET MINOR GENERAL (KITS) ×3 IMPLANT
KIT TURNOVER KIT A (KITS) ×3 IMPLANT
MANIFOLD NEPTUNE II (INSTRUMENTS) ×3 IMPLANT
NDL HYPO 18GX1.5 BLUNT FILL (NEEDLE) ×1 IMPLANT
NDL HYPO 25X1 1.5 SAFETY (NEEDLE) ×2 IMPLANT
NEEDLE HYPO 18GX1.5 BLUNT FILL (NEEDLE) ×3 IMPLANT
NEEDLE HYPO 25X1 1.5 SAFETY (NEEDLE) ×6 IMPLANT
NS IRRIG 1000ML POUR BTL (IV SOLUTION) ×3 IMPLANT
PACK MINOR (CUSTOM PROCEDURE TRAY) ×3 IMPLANT
PAD ARMBOARD 7.5X6 YLW CONV (MISCELLANEOUS) ×3 IMPLANT
SET BASIN LINEN APH (SET/KITS/TRAYS/PACK) ×3 IMPLANT
SPONGE LAP 18X18 RF (DISPOSABLE) ×3 IMPLANT
SUT MNCRL AB 4-0 PS2 18 (SUTURE) ×5 IMPLANT
SUT SILK 2 0 SH (SUTURE) ×3 IMPLANT
SUT VIC AB 3-0 SH 27 (SUTURE) ×6
SUT VIC AB 3-0 SH 27X BRD (SUTURE) ×1 IMPLANT
SYR BULB IRRIG 60ML STRL (SYRINGE) ×2 IMPLANT
SYR BULB IRRIGATION 50ML (SYRINGE) ×3 IMPLANT
SYR CONTROL 10ML LL (SYRINGE) ×6 IMPLANT

## 2020-02-03 NOTE — OR Nursing (Signed)
Sentinel node injection performed by Kennith Maes med tech. Left side.

## 2020-02-03 NOTE — Discharge Instructions (Signed)
REMOVE THE SCOPOLAMINE PATCH BEHIND LEFT EAR ON Thursday MAY 13TH, 2021. [facility] HANDS AFTER REMOVAL     Discharge instructions after breast surgery:   Common Complaints: Pain and bruising at the incision sites.  Swelling at the incision sites. Stiffness of the arm.   Diet/ Activity: Diet as tolerated.  You may shower but do not take hot showers as this can disrupt the glue. Rest and listen to your body, but do not remain in bed all day.  Walk everyday for at least 15-20 minutes. Deep cough and move around every 1-2 hours in the first few days after surgery.  Do not lift > 10 lbs for the first 2 weeks after surgery. Do not do anything that makes you feel like you are putting unnecessary pull or stretch on the incision sites.  Do move your arm and shoulder (see exercises options below). If you do not move then you can get stiff and hurt more.  Do not pick at the dermabond glue on your incision sites.  This glue film will remain in place for 1-2 weeks and will start to peel off.  Do not place lotions or balms on your incision unless instructed to specifically by Dr. [name].   Pain Expectations and Narcotics: -After surgery you will have pain associated with your incisions and this is normal. The pain is muscular and nerve pain, and will get better with time. -You are encouraged and expected to take non narcotic medications like tylenol and ibuprofen (when able) to treat pain as multiple modalities can aid with pain treatment. -Narcotics are only used when pain is severe or there is breakthrough pain. -You are not expected to have a pain score of 0 after surgery, as we cannot prevent pain. A pain score of 3-4 that allows you to be functional, move, walk, and tolerate some activity is the goal. The pain will continue to improve over the days after surgery and is dependent on your surgery. -Due to [place] law, we are only able to give a certain amount of pain medication to treat post operative pain,  and we only give additional narcotics on a patient by patient basis.  -For most laparoscopic surgery, studies have shown that the majority of patients only need 10-15 narcotic pills, and for open surgeries most patients only need 15-20.   -Having appropriate expectations of pain and knowledge of pain management with non narcotics is important as we do not want anyone to become addicted to narcotic pain medication.  -Using ice packs in the first 48 hours and heating pads after 48 hours, wearing an abdominal binder (when recommended), and using over the counter medications are all ways to help with pain management.   -Simple acts like meditation and mindfulness practices after surgery can also help with pain control and research has proven the benefit of these practices.  Medication: Take tylenol and ibuprofen as needed for pain control, alternating every 4-6 hours.  Example:  Tylenol 1000mg  @ 6am, 12noon, 6pm, 29midnight (Do not exceed 4000mg  of tylenol a day). Ibuprofen 800mg  @ 9am, 3pm, 9pm, 3am (Do not exceed 3600mg  of ibuprofen a day).  Take Roxicodone for breakthrough pain every 4 hours.  Take Colace for constipation related to narcotic pain medication. If you do not have a bowel movement in 2 days, take Miralax over the counter.  Drink plenty of water to also prevent constipation.   Contact Information: If you have questions or concerns, please call our office, [phone], Monday- Thursday 8AM-5PM  and Friday 8AM-12Noon.  If it is after hours or on the weekend, please call [facility], [phone], and ask to speak to the surgeon on call for Dr. [name] at [facility].   Exercises After Breast Surgery Do at least a few of the exercises below twice a day. It is ok to start the day after surgery and gradually build up the amount and type of exercises you do. Link to the exercises with pictures ([email or web address]).    Deep Breathing Exercise Deep breathing can help you relax and ease discomfort and tightness around your incision (surgical cut). It's also a very good way to relieve stress during the day.  Sit comfortably in a chair. Take a slow, deep breath through your nose. Let your chest and belly expand. Breathe out slowly through your mouth. Repeat as many times as needed.  Arm and Shoulder Exercises Doing arm and shoulder exercises will help you get back your full range of motion on your affected side (the side where you had your surgery). With full range of motion, you'll be able to: Move your arm over your head and out to the side Move your arm behind your neck Move your arm to the middle of your back Do each of the exercises below 5 times a day. Keep doing this until you have a full range of motion again and can use your arm as you did before surgery in all your normal activities. This includes activities at work, at home, and in recreation or sports. If you had limited movement in your arm before surgery, your goal will be to get back as much movement as you had before.  If you get your full range of motion back quickly, keep doing these exercises once a day instead of 5 times a day. This is especially true if you feel any tightness in your chest, shoulder, or under your affected arm. These exercises can help keep scar tissue from forming in your armpit and shoulder. Scar tissue can limit your arm movements later.  If you still have trouble moving your shoulder 4 weeks after your surgery, tell your surgeon. They'll tell you if you need more rehabilitation, such as physical or occupational therapy.  If you had one of the following surgeries, you can do the following set of exercises on the first day after your surgery, as long as your surgeon tells you it's safe.  Shoulder rolls The shoulder roll is a good exercise to start with because it gently stretches your chest and shoulder  muscles.  Stand or sit comfortably with your arms relaxed at your sides. Start with backward shoulder rolls. In a circular motion, bring your shoulders forward, up, backward, and down. Do this 10 times. Switch directions and do 10 forward shoulder rolls. Bring your shoulders backward, up, forward, and down. Do this 10 times. Try to make the circles as big as you can and move both shoulders at the same time. If you have some tightness across your incision or chest, start with smaller circles and make them bigger as the tightness decreases. The backward direction might feel a little tighter across your chest than the forward direction. This will get better with practice.  Shoulder wings The shoulder wings exercise will help you get back outward movement of your shoulder. You can do this exercise while sitting or standing.  Place your hands on your chest or collarbone. Raise your elbows out to the side, limiting your range of motion as instructed by  your healthcare team. Slowly lower your elbows. Do this 10 times. Then, slowly lower your hands. If you feel discomfort while doing this exercise, hold your position and do the deep breathing exercise. If the discomfort passes, raise your elbows a little higher. If it doesn't pass, don't raise your elbows any higher. Finish the exercise raising your elbows only high enough to feel a gentle stretch and no discomfort.  Arm circles If you had surgery on both breasts, do this exercise with both arms, 1 arm at a time. Don't do this exercise with both arms at the same time. This will put too much pressure on your chest.  Stand with your feet slightly apart for balance. Raise your affected arm out to the side as high as you can, limiting your range of movement as instructed by your healthcare team. Start making slow, backward circles in the air with your arm. Make sure you're moving your arm from your shoulder, not your elbow. Keep your elbow  straight. Increase the size of the circles until they're as big as you can comfortably make them, limiting your range of motion as instructed by your healthcare team. If you feel any aching or if your arm is tired, take a break. Keep doing the exercise when you feel better. Do 10 full backward circles. Then, slowly lower your arm to your side. Rest your arm for a moment. Follow steps 1 to 4 again, but this time make slow, forward circles.  W exercise You can do the W exercise while sitting or standing.  Form a "W" with your arms out to the side and palms facing forward (see Figure 4). Try to bring your hands up so they're even with your face. If you can't raise your arms that high, bring them to the highest comfortable position. Make sure to limit your range of motion as instructed by your healthcare team. Pinch your shoulder blades together and downward, as if you're squeezing a pencil between them. If you feel discomfort, stop at that position and do the deep breathing exercise. If the discomfort passes, try to bring your arms back a little further. If it doesn't pass, don't reach any further. Hold the furthest position that doesn't cause discomfort. Squeeze your shoulder blades together and downward for 5 seconds. Slowly bring your arms back down to the starting position. Repeat this movement 10 times.  Back Climb You can do the back climb stretch while sitting or standing. You'll need a timer or stopwatch.  Place your hands behind your back. Hold the hand on your affected side with your other hand. If you had surgery on both breasts, use the arm that moves most easily to hold the other. Slowly slide your hands up the center of your back as far as you can. If you feel tightness near your incision, stop at that position and do the deep breathing exercise. If the tightness decreases, try to slide your hands up a little further. If it doesn't decrease, don't slide your hands up any further. Hold  the highest position you can for 1 minute. Use your stopwatch or timer to keep track. You should feel a gentle stretch in your shoulder area. After 1 minute, slowly lower your hands.  Hands behind neck You can do the hands behind neck stretch while sitting or standing. You'll need a timer or stopwatch.  Clasp your hands together on your lap or in front of you. Slowly raise your hands toward your head, keeping your elbows  together in front of you, not out to the sides. Keep your head level. Don't bend your neck or head forward. Slide your hands over your head until you reach the back of your neck. When you get to this point, spread your elbows out to the sides. Hold this position for 1 minute. Use your stopwatch or timer to keep track. Breathe normally. Don't hold your breath as you stretch your body. If you have some tightness across your incision or chest, hold your position and do the deep breathing exercise. If the tightness decreases, continue with the movement. If the tightness stays the same, reach up and stretch your elbows back as best as you can without causing discomfort. Hold the position you're most comfortable in for 1 minute. Slowly come out of the stretch by bringing your elbows together and sliding your hands over your head. Then, slowly lower your arms.  Forward wall crawls You'll need 2 pieces of tape for the forward wall crawl exercise.  Stand facing a wall. Your toes should be about 6 inches (15 centimeters) from the wall. Reach as high as you can with your unaffected arm. [place] that point with a piece of tape. This will be the goal for your affected arm. If you had surgery on both breasts, set your goal using the arm that moves most comfortably. Place both hands against the wall at a level that's comfortable. Crawl your fingers up the wall as far as you can, keeping them even with each other.. Try not to look up toward your hands or arch your back. When you get to the point  where you feel a good stretch, but not pain, do the deep breathing exercise. Return to the starting position by crawling your fingers back down the wall. Repeat the wall crawl 10 times. Each time you raise your hands, try to crawl a little bit higher. On the 10th crawl, use the other piece of tape to mark the highest point you reached with your affected arm. This will let you to see your progress each time you do this exercise. As you become more flexible, you may need to take a step closer to the wall so you can reach a little higher.   Side wall crawls You'll also need 2 pieces of tape for the side wall crawl exercise.  You shouldn't feel pain while doing this exercise. It's normal to feel some tightness or pulling across the side of your chest. Focus on your breathing until the tightness decreases. Breathe normally throughout this exercise. Don't hold your breath.  Be careful not to turn your body toward the wall while doing this exercise. Make sure only the side of your body faces the wall.  If you had surgery on both breasts, start with step 3.  Stand with your unaffected side closest to the wall, about 1 foot (30.5 centimeters) away from the wall. Reach as high as you can with your unaffected arm. [place] that point with a piece of tape (see Figure 8). This will be the goal for your affected arm. Turn your body so your affected side is now closest to the wall. If you had surgery on both breasts, start with either side closest to the wall. Crawl your fingers up the wall as far as you can. When you get to the point where you feel a good stretch, but not pain, do the deep breathing exercise. Return to the starting position by crawling your fingers back down the wall. Repeat  this exercise 10 times. On your 10th crawl, use a piece of tape to mark the highest point you reached with your affected arm. This will let you see your progress each time you do the exercise. If you had surgery on both  breasts, repeat the exercise with your other arm.  Swelling After your surgery, you may have some swelling or puffiness in your hand or arm on your affected side. This is normal and usually goes away on its own.  If you notice swelling in your hand or arm, follow the tips below to help the swelling go away.  Raise your arm above your head and do hand pumps several times a day. To do hand pumps, slowly open and close your fist 10 times. This will help drain the fluid out of your arm. Don't hold your arm straight up over your head for more than a few minutes. This can cause your arm muscles to get tired. Raise your arm to the side a few times a day for about 20 minutes at a time. To do this, sit or lie down on your back. Rest your arm on a few pillows next to you so it's raised above the level of your heart. If you're able to sleep on your unaffected side, you can place 1 or 2 pillows in front of you and rest your affected arm on them while you sleep. If the swelling doesn't go down within 4 to 6 weeks, call your surgeon or nurse.   Lumpectomy, Care After This sheet gives you information about how to care for yourself after your procedure. Your health care provider may also give you more specific instructions. If you have problems or questions, contact your health care provider. What can I expect after the procedure? After the procedure, it is common to have:  Breast swelling.  Breast tenderness.  Stiffness in your arm or shoulder.  A change in the shape and feel of your breast.  Scar tissue that feels hard to the touch in the area where the lump was removed. Follow these instructions at home: Medicines  Take over-the-counter and prescription medicines only as told by your health care provider.  If you were prescribed an antibiotic medicine, take it as told by your health care provider. Do not stop taking the antibiotic even if you start to feel better.  Ask your health care provider  if the medicine prescribed to you: ? Requires you to avoid driving or using heavy machinery. ? Can cause constipation. You may need to take these actions to prevent or treat constipation:  Drink enough fluid to keep your urine pale yellow.  Take over-the-counter or prescription medicines.  Eat foods that are high in fiber, such as beans, whole grains, and fresh fruits and vegetables.  Limit foods that are high in fat and processed sugars, such as fried or sweet foods. Incision care      Follow instructions from your health care provider about how to take care of your incision. Make sure you: ? Wash your hands with soap and water before and after you change your bandage (dressing). If soap and water are not available, use hand sanitizer. ? Change your dressing as told by your health care provider. ? Leave stitches (sutures), skin glue, or adhesive strips in place. These skin closures may need to stay in place for 2 weeks or longer. If adhesive strip edges start to loosen and curl up, you may trim the loose edges. Do not remove  adhesive strips completely unless your health care provider tells you to do that.  Check your incision area every day for signs of infection. Check for: ? More redness, swelling, or pain. ? Fluid or blood. ? Warmth. ? Pus or a bad smell.  Keep your dressing clean and dry.  If you were sent home with a surgical drain in place, follow instructions from your health care provider about emptying it. Bathing  Do not take baths, swim, or use a hot tub until your health care provider approves.  You may shower.  Activity  Rest as told by your health care provider.  Avoid sitting for a long time without moving. Get up to take short walks every 1-2 hours. This is important to improve blood flow and breathing. Ask for help if you feel weak or unsteady.  Return to your normal activities as told by your health care provider. Ask your health care provider what  activities are safe for you.  Be careful to avoid any activities that could cause an injury to your arm on the side of your surgery.  Do not lift anything that is heavier than 10 lb (4.5 kg), or the limit that you are told, until your health care provider says that it is safe. Avoid lifting with the arm that is on the side of your surgery.  Do not carry heavy objects on your shoulder on the side of your surgery.  Do exercises to keep your shoulder and arm from getting stiff and swollen. Talk with your health care provider about which exercises are safe for you. General instructions  Wear a supportive bra as told by your health care provider.  Raise (elevate) your arm above the level of your heart while you are sitting or lying down.  Do not wear tight jewelry on your arm, wrist, or fingers on the side of your surgery.  Keep all follow-up visits as told by your health care provider. This is important. ? You may need to be screened for extra fluid around the lymph nodes and swelling in the breast and arm (lymphedema). Follow instructions from your health care provider about how often you should be checked.  If you had any lymph nodes removed during your procedure, be sure to tell all of your health care providers. This is important information to share before you are involved in certain procedures, such as having blood tests or having your blood pressure taken. Contact a health care provider if:  You develop a rash.  You have a fever.  Your pain medicine is not working.  You have swelling, weakness, or numbness in your arm that does not improve after a few weeks.  You have new swelling in your breast.  You have any of these signs of infection: ? More redness, swelling, or pain in your incision area. ? Fluid or blood coming from your incision. ? Warmth coming from the incision area. ? Pus or a bad smell coming from your incision. Get help right away if you have:  Very bad pain in  your breast or arm.  Swelling in your legs or arms.  Redness, warmth, or pain in your leg or arm.  Chest pain.  Difficulty breathing. Summary  After the procedure, it is common to have breast tenderness, swelling in your breast, and stiffness in your arm and shoulder.  Follow instructions from your health care provider about how to take care of your incision.  Do not lift anything that is heavier than  10 lb (4.5 kg), or the limit that you are told, until your health care provider says that it is safe. Avoid lifting with the arm that is on the side of your surgery.  If you had any lymph nodes removed during your procedure, be sure to tell all of your health care providers. This is important information to share before you are involved in certain procedures, such as having blood tests or having your blood pressure taken. This information is not intended to replace advice given to you by your health care provider. Make sure you discuss any questions you have with your health care provider. Document Revised: 03/18/2019 Document Reviewed: 03/18/2019 Elsevier Patient Education  [place] Anesthesia, Adult, Care After This sheet gives you information about how to care for yourself after your procedure. Your health care provider may also give you more specific instructions. If you have problems or questions, contact your health care provider. What can I expect after the procedure? After the procedure, the following side effects are common:  Pain or discomfort at the IV site.  Nausea.  Vomiting.  Sore throat.  Trouble concentrating.  Feeling cold or chills.  Weak or tired.  Sleepiness and fatigue.  Soreness and body aches. These side effects can affect parts of the body that were not involved in surgery. Follow these instructions at home:  For at least 24 hours after the procedure:  Have a responsible adult stay with you. It is important to have  someone help care for you until you are awake and alert.  Rest as needed.  Do not: ? Participate in activities in which you could fall or become injured. ? Drive. ? Use heavy machinery. ? Drink alcohol. ? Take sleeping pills or medicines that cause drowsiness. ? Make important decisions or sign legal documents. ? Take care of children on your own. Eating and drinking  Follow any instructions from your health care provider about eating or drinking restrictions.  When you feel hungry, start by eating small amounts of foods that are soft and easy to digest (bland), such as toast. Gradually return to your regular diet.  Drink enough fluid to keep your urine pale yellow.  If you vomit, rehydrate by drinking water, juice, or clear broth. General instructions  If you have sleep apnea, surgery and certain medicines can increase your risk for breathing problems. Follow instructions from your health care provider about wearing your sleep device: ? Anytime you are sleeping, including during daytime naps. ? While taking prescription pain medicines, sleeping medicines, or medicines that make you drowsy.  Return to your normal activities as told by your health care provider. Ask your health care provider what activities are safe for you.  Take over-the-counter and prescription medicines only as told by your health care provider.  If you smoke, do not smoke without supervision.  Keep all follow-up visits as told by your health care provider. This is important. Contact a health care provider if:  You have nausea or vomiting that does not get better with medicine.  You cannot eat or drink without vomiting.  You have pain that does not get better with medicine.  You are unable to pass urine.  You develop a skin rash.  You have a fever.  You have redness around your IV site that gets worse. Get help right away if:  You have difficulty breathing.  You have chest pain.  You have  blood in your  urine or stool, or you vomit blood. Summary  After the procedure, it is common to have a sore throat or nausea. It is also common to feel tired.  Have a responsible adult stay with you for the first 24 hours after general anesthesia. It is important to have someone help care for you until you are awake and alert.  When you feel hungry, start by eating small amounts of foods that are soft and easy to digest (bland), such as toast. Gradually return to your regular diet.  Drink enough fluid to keep your urine pale yellow.  Return to your normal activities as told by your health care provider. Ask your health care provider what activities are safe for you. This information is not intended to replace advice given to you by your health care provider. Make sure you discuss any questions you have with your health care provider. Document Revised: 09/15/2017 Document Reviewed: 04/28/2017 Elsevier Patient Education  [place].

## 2020-02-03 NOTE — H&P (Signed)
[facility] History and Physical  Reason for Referral: Left breast ductal carcinoma in situ  Referring Physician:  [name] is a 54 y.o. female.  HPI: Gwendolyn Flores is a 54 yo who who underwent her normal screening mammogram in April and was found to have suspicious calcifications. 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 She has a prior history of thyroid cancer and had a thyroidectomy. She is otherwise healthy.  The patient has no history of any masses, lumps, bumps, nipple changes or discharge. She had menarche at age 16, and her first pregnancy at age 54. She is G2P2.  She has a distant family history breast cancer in a paternal cousin, and her mother has a history of lobular carcinoma in situ but was over 32 at this diagnosis.  She underwent menopause at 68.  She has never had any previous biopsies or concerning areas on mammogram.  She has not had any chest radiation. She did receive radioactive iodine for her thyroid cancer.    Past Medical History:  Diagnosis Date  . Arthritis   . Cancer Gwendolyn Flores)    thyroid cancer  . Chicken pox   . Headache   . High cholesterol   . History of thyroid cancer 06/28/2013  . Rectocele 06/28/2013  . Thyroid disease    cancer  . Urinary tract infection     Past Surgical History:  Procedure Laterality Date  . THYROIDECTOMY    . TONSILECTOMY, ADENOIDECTOMY, BILATERAL MYRINGOTOMY AND TUBES     Lobular carcinoma in situ in her mother  Family History  Problem Relation Age of Onset  . Heart disease Father        heart attack  . Diabetes Brother   . Migraines Neg Hx     Social History   Tobacco Use  . Smoking status: Never Smoker  . Smokeless tobacco: Never Used  Substance Use Topics  . Alcohol use: Yes    Comment: wine socially  . Drug use: No    Medications: I have reviewed the patient's current medications. Current Facility-Administered  Medications  Medication Dose Route Frequency Provider Last Rate Last Admin  . ceFAZolin (ANCEF) IVPB 2g/100 mL premix  2 g Intravenous On Call to OR Gwendolyn Cagey, MD      . Chlorhexidine Gluconate Cloth 2 % PADS 6 each  6 each Topical Once Gwendolyn Cagey, MD       And  . Chlorhexidine Gluconate Cloth 2 % PADS 6 each  6 each Topical Once Gwendolyn Cagey, MD      . lactated ringers infusion   Intravenous Once Battula, Rajamani C, MD      . lidocaine HCl (PF) (XYLOCAINE) 2 % injection           . methylene blue 0.5 % injection    PRN Gwendolyn Cagey, MD   2 mL at 02/03/20 0917  . midazolam (VERSED) injection 2 mg  2 mg Intravenous Once Battula, Rajamani C, MD      . pentafluoroprop-tetrafluoroeth (GEBAUERS) aerosol           . sodium chloride (PF) 0.9 % injection    PRN Gwendolyn Cagey, MD   3 mL at 02/03/20 0917    Allergies  Allergen Reactions  . Codeine Nausea And Vomiting  . Levaquin [Levofloxacin In D5w] Other (See Comments)    A fib; abnormal heart  rhythm  . Sulfa Antibiotics Nausea And Vomiting    ROS:  A comprehensive review of systems was negative.   Blood pressure 115/80, pulse 68, temperature 97.9 F (36.6 Flores), temperature source Oral, height 5\' 5"  (1.651 m), weight 62.6 kg, last menstrual period 08/12/2016, SpO2 98 %. Physical Exam Vitals reviewed.  Constitutional:      Appearance: She is normal weight.  HENT:     Head: Normocephalic and atraumatic.     Nose: Nose normal.     Mouth/Throat:     Mouth: Mucous membranes are moist.  Eyes:     Extraocular Movements: Extraocular movements intact.     Pupils: Pupils are equal, round, and reactive to light.  Cardiovascular:     Rate and Rhythm: Normal rate and regular rhythm.  Pulmonary:     Effort: Pulmonary effort is normal.     Breath sounds: Normal breath sounds.  Chest:     Breasts:        Right: No inverted nipple, mass or nipple discharge.        Left: No inverted nipple, mass or nipple  discharge.     Comments: No left axillary adenopathy, left breast needle localization in place, no masses or adenopathy on right breast Abdominal:     General: There is no distension.     Palpations: Abdomen is soft.     Tenderness: There is no abdominal tenderness.  Musculoskeletal:        General: No swelling. Normal range of motion.     Cervical back: Normal range of motion. No rigidity.  Lymphadenopathy:     Upper Body:     Right upper body: No supraclavicular or axillary adenopathy.     Left upper body: No supraclavicular or axillary adenopathy.  Skin:    General: Skin is warm and dry.  Neurological:     General: No focal deficit present.     Mental Status: She is alert and oriented to person, place, and time.  Psychiatric:        Mood and Affect: Mood normal.        Behavior: Behavior normal.        Thought Content: Thought content normal.        Judgment: Judgment normal.     Results: Pathology: Breast, left, needle core biopsy, upper outer quadrant at middle depth - DUCTAL CARCINOMA IN SITU WITH CALCIFICATIONS AND NECROSIS. Microscopic Comment The carcinoma appears high grade. Prognostic makers will be ordered. Dr. Lyndon Flores has reviewed the case. The case was called to [facility] on 01/21/2020. ER- 100%, PR 90%  Mammogram/ biopsy: CLINICAL DATA:  Confirmation of clip placement after stereotactic tomosynthesis core needle biopsy of an indeterminate 5 mm group calcifications involving the UPPER OUTER QUADRANT of the LEFT breast at MIDDLE depth.  EXAM: 2D AND TOMOSYNTHESIS DIAGNOSTIC LEFT MAMMOGRAM POST STEREOTACTIC BIOPSY  COMPARISON:  Previous exam(s).  FINDINGS: Tomosynthesis and synthesized full field CC and mediolateral images were obtained following stereotactic tomosynthesis guided biopsy of calcifications involving the UPPER OUTER QUADRANT of the LEFT breast. The coil shaped tissue marking clip is appropriately position at the site  of the biopsied calcifications. There are residual calcifications from the biopsied group which extend approximately 1 cm SUPERIOR and LATERAL to the clip.  Expected post biopsy changes are present without evidence.  IMPRESSION: Appropriate positioning of the coil shaped tissue marking clip at the site of the biopsied calcifications in the UPPER OUTER QUADRANT of the LEFT breast at  MIDDLE depth. There are residual calcifications which extend approximately 1 cm SUPERIOR and LATERAL to the clip.  Final Assessment: Post Procedure Mammograms for Marker Placement   Electronically Signed   By: Evangeline Dakin M.D.   On: 01/20/2020 09:27  Diagnostic Mammogram: CLINICAL DATA:  Patient recalled from screening for left breast calcifications.  EXAM: DIGITAL DIAGNOSTIC LEFT MAMMOGRAM WITH CAD  COMPARISON:  Previous exam(s).  ACR Breast Density Category Flores: The breast tissue is heterogeneously dense, which may obscure small masses.  FINDINGS: Magnification CC and true lateral views of the left breast were obtained. There is a new 5 mm group of coarse heterogeneous calcifications within the upper-outer left breast middle depth, demonstrated on both the CC and MLO Mag views. Additional scattered left breast calcifications are stable.  Mammographic images were processed with CAD.  IMPRESSION: New indeterminate coarse heterogeneous calcifications upper-outer left breast.  RECOMMENDATION: Stereotactic guided core needle biopsy new focal group of coarse heterogeneous calcifications left breast.  I have discussed the findings and recommendations with the patient. If applicable, a reminder letter will be sent to the patient regarding the next appointment.  BI-RADS CATEGORY  4: Suspicious.   Electronically Signed   By: Lovey Newcomer M.D.   On: 01/10/2020 13:57  CLINICAL DATA:  Screening.  EXAM: DIGITAL SCREENING BILATERAL MAMMOGRAM WITH TOMO AND  CAD  COMPARISON:  Previous exam(s).  ACR Breast Density Category Flores: The breast tissue is heterogeneously dense, which may obscure small masses.  FINDINGS: In the left breast, calcifications warrant further evaluation. In the right breast, no findings suspicious for malignancy. Images were processed with CAD.  IMPRESSION: Further evaluation is suggested for calcifications in the left breast.  RECOMMENDATION: Diagnostic mammogram of the left breast. (Flores:[id number])  The patient will be contacted regarding the findings, and additional imaging will be scheduled.  BI-RADS CATEGORY  0: Incomplete. Need additional imaging evaluation and/or prior mammograms for comparison.   Electronically Signed   By: Kristopher Oppenheim M.D.   On: 01/07/2020 08:50   Assessment & Plan:  Gwendolyn Flores is a 54 y.o. 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 We discussed the potential that this could have invasive cancer associated with it, and have discussed her surgical options of mastectomy versus partial mastectomy and radiation.  We discussed the option of sentinel lymph node biopsy given the high grade features and concern that she would need more surgery. She would like to avoid further surgery if possible.    -Partial mastectomy after needle localization, sentinel lymph node biopsy -Discussed risk of bleeding, infection, need for further surgery, finding invasive cancer, and need for radiation follow surgery. Discussed use of blue dye and allergic reactions related to the blue dye.  -She has also had extensive discussions with my partner, her husband Dr. Arnoldo Morale, and is well educated and aware of her options.  All questions were answered to the satisfaction of the patient and family.   Gwendolyn Flores 02/03/2020, 9:19 AM

## 2020-02-03 NOTE — Anesthesia Postprocedure Evaluation (Signed)
Anesthesia Post Note  Patient: Gwendolyn Flores  Procedure(s) Performed: PARTIAL MASTECTOMY WITH NEEDLE LOCALIZATION AND AXILLARY SENTINEL LYMPH NODE BX (Left Breast)  Patient location during evaluation: PACU Anesthesia Type: General Level of consciousness: awake and alert Pain management: pain level controlled Vital Signs Assessment: post-procedure vital signs reviewed and stable Respiratory status: spontaneous breathing, nonlabored ventilation and respiratory function stable Cardiovascular status: stable Postop Assessment: no apparent nausea or vomiting Anesthetic complications: no     Last Vitals:  Vitals:   02/03/20 1315 02/03/20 1325  BP: 110/81   Pulse: 76 85  Resp: 17 (!) 21  Temp:    SpO2: 99% 100%    Last Pain:  Vitals:   02/03/20 1238  TempSrc:   PainSc: 0-No pain                 Khye Hochstetler Hristova

## 2020-02-03 NOTE — Anesthesia Procedure Notes (Signed)
Procedure Name: LMA Insertion Date/Time: 02/03/2020 9:48 AM Performed by: Hewitt Blade, CRNA Pre-anesthesia Checklist: Patient identified, Emergency Drugs available, Suction available and Patient being monitored Patient Re-evaluated:Patient Re-evaluated prior to induction Oxygen Delivery Method: Circle system utilized Preoxygenation: Pre-oxygenation with 100% oxygen Induction Type: IV induction Ventilation: Mask ventilation without difficulty LMA: LMA inserted LMA Size: 4.0 Number of attempts: 1 Placement Confirmation: positive ETCO2 and breath sounds checked- equal and bilateral Tube secured with: Tape Dental Injury: Teeth and Oropharynx as per pre-operative assessment

## 2020-02-03 NOTE — OR Nursing (Signed)
Mammo notified patient ready for needle loc.

## 2020-02-03 NOTE — Anesthesia Preprocedure Evaluation (Signed)
Anesthesia Evaluation  Patient identified by MRN, date of birth, ID band Patient awake    Reviewed: Allergy & Precautions, NPO status , Patient's Chart, lab work & pertinent test results  Airway Mallampati: II  TM Distance: >3 FB Neck ROM: Full    Dental  (+) Dental Advisory Given, Teeth Intact   Pulmonary    Pulmonary exam normal breath sounds clear to auscultation       Cardiovascular Exercise Tolerance: Good  Rhythm:Regular Rate:Normal     Neuro/Psych  Headaches, negative psych ROS   GI/Hepatic negative GI ROS, Neg liver ROS,   Endo/Other  Hypothyroidism   Renal/GU negative Renal ROS     Musculoskeletal  (+) Arthritis ,   Abdominal   Peds  Hematology   Anesthesia Other Findings   Reproductive/Obstetrics                             Anesthesia Physical Anesthesia Plan  ASA: II  Anesthesia Plan: General   Post-op Pain Management:    Induction: Intravenous  PONV Risk Score and Plan: 4 or greater and Ondansetron, Dexamethasone, Scopolamine patch - Pre-op and Midazolam  Airway Management Planned: LMA  Additional Equipment:   Intra-op Plan:   Post-operative Plan: Extubation in OR  Informed Consent: I have reviewed the patients History and Physical, chart, labs and discussed the procedure including the risks, benefits and alternatives for the proposed anesthesia with the patient or authorized representative who has indicated his/her understanding and acceptance.     Dental advisory given  Plan Discussed with: CRNA  Anesthesia Plan Comments:         Anesthesia Quick Evaluation

## 2020-02-03 NOTE — Procedures (Signed)
PreOperative Dx: LEFT breast DCIS Postoperative Dx: LEFT breast DCIS Procedure:   Preoperative needle localization of theLEFT breast under mammographic guidance Radiologist:  Thornton Papas Anesthesia:  2 ml of1% lidocaine Specimen:  None EBL:   < 1 ml Complications: None

## 2020-02-03 NOTE — OR Nursing (Signed)
Returned to preop room 2,  Via wheelchair .

## 2020-02-03 NOTE — OR Nursing (Signed)
Gwendolyn Flores to mammo at [phone] via w/c accompanied  By husband and staff.

## 2020-02-03 NOTE — Transfer of Care (Signed)
Immediate Anesthesia Transfer of Care Note  Patient: NIGER RASSI  Procedure(s) Performed: PARTIAL MASTECTOMY WITH NEEDLE LOCALIZATION AND AXILLARY SENTINEL LYMPH NODE BX (Left Breast)  Patient Location: PACU  Anesthesia Type:General  Level of Consciousness: awake  Airway & Oxygen Therapy: Patient Spontanous Breathing  Post-op Assessment: Report given to RN and Post -op Vital signs reviewed and stable  Post vital signs: Reviewed and stable  Last Vitals:  Vitals Value Taken Time  BP 120/74 02/03/20 1238  Temp    Pulse 96 02/03/20 1241  Resp 12 02/03/20 1241  SpO2 96 % 02/03/20 1241  Vitals shown include unvalidated device data.  Last Pain:  Vitals:   02/03/20 0931  TempSrc: Oral  PainSc:       Patients Stated Pain Goal: 6 ([id number])  Complications: No apparent anesthesia complications

## 2020-02-03 NOTE — Op Note (Signed)
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 [name], MD   Assistants: No qualified resident was available    Anesthesia: General endotracheal   Anesthesiologist: Denese Killings, MD    Specimens: Sentinel lymph nodes; partial mastectomy left breast; additional margins medial, lateral, inferior, superior, deep    Estimated Blood Loss: Minimal   Blood Replacement: None    Complications: None   Wound Class: Clean    Operative Indications:  Gwendolyn Flores is a sweet 54 yo with newly diagnosed left ductal carcinoma in situ high grade with necrosis.  We discussed the options of mastectomy versus partial mastectomy and radiation, and also discussed the option of a sentinel node given the high grade features and potential of invasive disease in the specimen. She opted to proceed with sentinel node and partial mastectomy after needle localization.  We discussed the risk of bleeding, infection, need for further surgery and treatment, lymphedema, injury to nerves, and she opted to proceed.   Findings: Enlarged lymph node with minor blue staining    Procedure: The patient was taken to radiology in the morning and radiotracer was injected around her areola. She then went and had Dr. Thornton Papas perform a needle localization.  I reviewed the images with Dr. Thornton Papas and confirmed that the area was superficial in nature and reviewed the trajectory with him.  From here she was taken to the operating room and placed supine. General endotracheal anesthesia was induced. Intravenous antibiotics were administered per protocol.  The wire was clipped down to prevent accidental dislodgement and the blue dye was injected at the 12 o'clock, 3 o'clock, 6 o'clock, and 9  o'clock positions in the subdermal tissue.  This was massaged for over 5 minutes to allow the dye to travel down the lymphatic channels. The left breast and axilla were prepared and draped in the usual sterile fashion.   The axilla was probed with the hand handled gamma probe and and incision was made at the lower hairline at the hottest spot.  Prior to the incision the counts were 1189.  An incision was made and a hot node that was enlarged with some minor blue staining was identified. The probe was placed in contact and 6240 was recorded in vivo.  This node was enlarged to about 2cm in size approximately but was soft in nature. It appeared to be associated with a lymphatic chain with 3 smaller nodes deeper.  I carefully dissected out the large node, and noted again the blue dye at the hilum but not throughout the whole node.  Placing the probe over the more inferior nodes that again were only hot but not blue, these had counts of 6411 in the cluster of the chain.  Given that I did not want to lose any of these nodes, I carefully dissected out the who chain and clipped the lymphatics and small vessels going to the lymph nodes.  Ex vivo the enlarged node measured 4826, an the separate smaller nodes inferior of which there were three measured 4610, 3910, and 1600.  Given this I probed the remaining bed, and superior just under the pectoralis there was activity measuring 931, but I could not identify a node in this region. There did appear to be a lymphatic channel. The only node I could  identify in the base was probed and counts were well below 10% of 4826 with each attempt.  Given this and the fact that I had been fairly deep removing this entire lymphatic chain, I opted to not be more aggressive with the lymph node biopsy. I did not palpate any enlarged nodes or visual any additional blue spots. Hemostasis was confirmed. From here attention was turned to the partial mastectomy.   A circumareolar incision was  made around the lateral margin of the areola and the wire was brought into the incision. With great care, I placed a traction suture of 3-0 silk superior to the needle, and carefully raised flaps on either side of the incision and carried down the excision through the breast tissue ensuring adequate tissue around the needle and deep to the needle.  This was performed with a combination of sharp dissection with Mayo scissors and cautery.  Once the specimen was removed, the tractions suture was removed and the tissue was marked short superior and long lateral with a 3-0 silk suture. The specimen was sent to radiology where confirmation of the biopsy clip and calcifications being in the specimen were confirmed.  Additional separate margins were taken in the medial, lateral, superior, inferior, and deep portions of the specimen. These were sent separately. The superficial margin was within the main specimen as this was directly down in trajectory from the incision.  The lateral skin flap did feel a little thin but was viable. There has been prior bruising medially toward the nipple from the needle localization procedure.  Hemostasis was achieved and a small vessel in the deep lateral portion of the cavity was clipped.  The dermis was closed with interrupted 3-0 Vicryl, leaving the cavity open to create a seroma for cosmesis.  The skin was closed with a running 4-0 Monocryl subcuticular.  The axillary incision was closed with 3-0 Vicryl interrupted deep and the skin was closed with 4-0 Monocryl running subcuticular. The skin was closed with dermabond on both incisions.   Final inspection revealed acceptable hemostasis. All counts were correct at the end of the case. The patient was awakened from anesthesia and extubated without complication.  The patient went to the PACU in stable condition.   Gwendolyn Labrum, MD [facility] [address] [place], [place] [address] [phone]  (office)

## 2020-02-04 NOTE — Progress Notes (Signed)
Interval history 02/05/2020: Botox extremely beneficial. She was having 15 headache days a month with 8 severe migraines but since we started botox she is extremely better > 90% improvement. Imitrex works well. Emgality has really helped. She is doing well right now. Trokendi titrated up to 50mg  and helped but the hair loss. Could try Qudexy, or zonegran. She did not see a difference with the Ajovy vs emgality ve aimovig,  The remaining headaches are more tension tyoe, tensionin the temporal lobes always sometimes in the back. Dry needling helped but she sees a Restaurant manager, fast food.  Lightly in the cervical spinal muscles last time she felt weak. She thinks she has fibromyalgia, we will perform a rheumatologic evaluation due to her joint pain and FHx of autoimmune disorders. She has pain in the bilateral SCMs behind the ears, very proximally  Consent Form Botulism Toxin Injection For Chronic Migraine    Reviewed orally with patient, additionally signature is on file:  Botulism toxin has been approved by the Federal drug administration for treatment of chronic migraine. Botulism toxin does not cure chronic migraine and it may not be effective in some patients.  The administration of botulism toxin is accomplished by injecting a small amount of toxin into the muscles of the neck and head. Dosage must be titrated for each individual. Any benefits resulting from botulism toxin tend to wear off after 3 months with a repeat injection required if benefit is to be maintained. Injections are usually done every 3-4 months with maximum effect peak achieved by about 2 or 3 weeks. Botulism toxin is expensive and you should be sure of what costs you will incur resulting from the injection.  The side effects of botulism toxin use for chronic migraine may include:   -Transient, and usually mild, facial weakness with facial injections  -Transient, and usually mild, head or neck weakness with head/neck injections  -Reduction  or loss of forehead facial animation due to forehead muscle weakness  -Eyelid drooping  -Dry eye  -Pain at the site of injection or bruising at the site of injection  -Double vision  -Potential unknown long term risks  Contraindications: You should not have Botox if you are pregnant, nursing, allergic to albumin, have an infection, skin condition, or muscle weakness at the site of the injection, or have myasthenia gravis, Lambert-Eaton syndrome, or ALS.  It is also possible that as with any injection, there may be an allergic reaction or no effect from the medication. Reduced effectiveness after repeated injections is sometimes seen and rarely infection at the injection site may occur. All care will be taken to prevent these side effects. If therapy is given over a long time, atrophy and wasting in the muscle injected may occur. Occasionally the patient's become refractory to treatment because they develop antibodies to the toxin. In this event, therapy needs to be modified.  I have read the above information and consent to the administration of botulism toxin.    BOTOX PROCEDURE NOTE FOR MIGRAINE HEADACHE    Contraindications and precautions discussed with patient(above). Aseptic procedure was observed and patient tolerated procedure. Procedure performed by Dr. Georgia Dom  The condition has existed for more than 6 months, and pt does not have a diagnosis of ALS, Myasthenia Gravis or Lambert-Eaton Syndrome.  Risks and benefits of injections discussed and pt agrees to proceed with the procedure.  Written consent obtained  These injections are medically necessary. Pt  receives good benefits from these injections. These injections do not cause sedations  or hallucinations which the oral therapies may cause.  Description of procedure:  The patient was placed in a sitting position. The standard protocol was used for Botox as follows, with 5 units of Botox injected at each site:   -Procerus  muscle, midline injection  -Corrugator muscle, bilateral injection  -Frontalis muscle, bilateral injection, with 2 sites each side, medial injection was performed in the upper one third of the frontalis muscle, in the region vertical from the medial inferior edge of the superior orbital rim. The lateral injection was again in the upper one third of the forehead vertically above the lateral limbus of the cornea, 1.5 cm lateral to the medial injection site.  -Temporalis muscle injection, 4 sites, bilaterally. The first injection was 3 cm above the tragus of the ear, second injection site was 1.5 cm to 3 cm up from the first injection site in line with the tragus of the ear. The third injection site was 1.5-3 cm forward between the first 2 injection sites. The fourth injection site was 1.5 cm posterior to the second injection site.   -Occipitalis muscle injection, 3 sites, bilaterally. The first injection was done one half way between the occipital protuberance and the tip of the mastoid process behind the ear. The second injection site was done lateral and superior to the first, 1 fingerbreadth from the first injection. The third injection site was 1 fingerbreadth superiorly and medially from the first injection site.  -Cervical paraspinal muscle injection, 2 sites, bilateral knee first injection site was 1 cm from the midline of the cervical spine, 3 cm inferior to the lower border of the occipital protuberance. The second injection site was 1.5 cm superiorly and laterally to the first injection site.  -Trapezius muscle injection was performed at 3 sites, bilaterally. The first injection site was in the upper trapezius muscle halfway between the inflection point of the neck, and the acromion. The second injection site was one half way between the acromion and the first injection site. The third injection was done between the first injection site and the inflection point of the neck.   Will return for  repeat injection in 3 months.   200 units of Botox was used, any Botox not injected was wasted. The patient tolerated the procedure well, there were no complications of the above procedure.

## 2020-02-05 ENCOUNTER — Ambulatory Visit (INDEPENDENT_AMBULATORY_CARE_PROVIDER_SITE_OTHER): Payer: 59 | Admitting: Neurology

## 2020-02-05 ENCOUNTER — Other Ambulatory Visit (HOSPITAL_COMMUNITY): Payer: 59

## 2020-02-05 ENCOUNTER — Other Ambulatory Visit: Payer: Self-pay

## 2020-02-05 VITALS — Temp 97.9°F

## 2020-02-05 DIAGNOSIS — G43709 Chronic migraine without aura, not intractable, without status migrainosus: Secondary | ICD-10-CM

## 2020-02-05 DIAGNOSIS — D8989 Other specified disorders involving the immune mechanism, not elsewhere classified: Secondary | ICD-10-CM

## 2020-02-05 LAB — SURGICAL PATHOLOGY

## 2020-02-05 NOTE — Progress Notes (Signed)
Botox- 200 units x 1 vial Lot: [id number] Expiration: 09/2022 NDC: 0023-3921-02  Bacteriostatic 0.9% Sodium Chloride- 4mL total Lot: [id number] Expiration: 03/26/2020 NDC: 0409-1966-02  Dx: G43.709 B/B  

## 2020-02-07 ENCOUNTER — Other Ambulatory Visit (HOSPITAL_COMMUNITY): Payer: Self-pay | Admitting: Endocrinology

## 2020-02-07 ENCOUNTER — Encounter (HOSPITAL_COMMUNITY): Payer: 59

## 2020-02-07 ENCOUNTER — Other Ambulatory Visit (HOSPITAL_COMMUNITY): Payer: 59

## 2020-02-07 DIAGNOSIS — Z85850 Personal history of malignant neoplasm of thyroid: Secondary | ICD-10-CM | POA: Diagnosis not present

## 2020-02-07 DIAGNOSIS — E559 Vitamin D deficiency, unspecified: Secondary | ICD-10-CM | POA: Diagnosis not present

## 2020-02-07 DIAGNOSIS — Z833 Family history of diabetes mellitus: Secondary | ICD-10-CM | POA: Diagnosis not present

## 2020-02-07 DIAGNOSIS — E890 Postprocedural hypothyroidism: Secondary | ICD-10-CM | POA: Diagnosis not present

## 2020-02-11 LAB — ANA COMPREHENSIVE PANEL
Anti JO-1: <0.2 AI (ref 0.0–0.9)
Centromere Ab Screen: <0.2 AI (ref 0.0–0.9)
Chromatin Ab SerPl-aCnc: <0.2 AI (ref 0.0–0.9)
ENA RNP Ab: <0.2 AI (ref 0.0–0.9)
ENA SM Ab Ser-aCnc: <0.2 AI (ref 0.0–0.9)
ENA SSA (RO) Ab: <0.2 AI (ref 0.0–0.9)
ENA SSB (LA) Ab: <0.2 AI (ref 0.0–0.9)
Scleroderma (Scl-70) (ENA) Antibody, IgG: <0.2 AI (ref 0.0–0.9)
dsDNA Ab: 5 IU/mL (ref 0–9)

## 2020-02-11 LAB — HEAVY METALS, BLOOD
Arsenic: 5 ug/L (ref 2–23)
Lead, Blood: 1 ug/dL (ref 0–4)
Mercury: <1 ug/L (ref 0.0–14.9)

## 2020-02-11 LAB — RHEUMATOID FACTOR: Rheumatoid fact SerPl-aCnc: 10.8 IU/mL (ref 0.0–13.9)

## 2020-02-11 LAB — ANTINUCLEAR ANTIBODIES, IFA: ANA Titer 1: NEGATIVE

## 2020-02-11 LAB — SEDIMENTATION RATE: Sed Rate: 3 mm/hr (ref 0–40)

## 2020-02-11 LAB — C-REACTIVE PROTEIN: CRP: <1 mg/L (ref 0–10)

## 2020-02-11 LAB — METHYLMALONIC ACID, SERUM: Methylmalonic Acid: 215 nmol/L (ref 0–378)

## 2020-02-11 LAB — B12 AND FOLATE PANEL
Folate: 8.2 ng/mL (ref >3.0)
Vitamin B-12: 522 pg/mL (ref 232–1245)

## 2020-02-11 LAB — B. BURGDORFI ANTIBODIES: Lyme IgG/IgM Ab: <0.91 {ISR} (ref 0.00–0.90)

## 2020-02-11 LAB — CYCLIC CITRUL PEPTIDE ANTIBODY, IGG/IGA: Cyclic Citrullin Peptide Ab: 4 units (ref 0–19)

## 2020-02-16 NOTE — Progress Notes (Addendum)
[place] CONSULT NOTE  Patient Care Team: Isaac Bliss, Rayford Halsted, MD as PCP - General (Internal Medicine)  CHIEF COMPLAINTS/PURPOSE OF CONSULTATION:  Newly diagnosed breast cancer  HISTORY OF PRESENTING ILLNESS:  Gwendolyn Flores 54 y.o. female is here because of recent diagnosis of left breast DCIS. Screening mammogram on 01/06/20 showed left breast calcifications. Diagnostic mammogram on 01/10/20 showed a 0.5cm group of calcifications in the upper-outer left breast. Biopsy on 01/20/20 showed high grade ductal carcinoma in situ, ER+ 100%, PR+ 90%. She underwent a left lumpectomy on 02/03/20 with Dr. Ria Comment for which pathology showed high grade DCIS, 1.2cm, 1 left axillary lymph node negative for carcinoma. She presents to the clinic today for initial evaluation and to discuss treatment options.   I reviewed her records extensively and collaborated the history with the patient.  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 Diagnostic mammogram showed a 0.5cm group of calcifications in the upper-outer left breast. Biopsy showed high grade DCIS, ER+ 100%, PR+ 90%.    02/03/2020 Surgery   Left lumpectomy: high grade DCIS, 1.2cm, 1 left axillary lymph node negative for carcinoma.     MEDICAL HISTORY:  Past Medical History:  Diagnosis Date  . Arthritis   . Cancer [facility])    thyroid cancer  . Chicken pox   . Headache   . High cholesterol   . History of thyroid cancer 06/28/2013  . Rectocele 06/28/2013  . Thyroid disease    cancer  . Urinary tract infection     SURGICAL HISTORY: Past Surgical History:  Procedure Laterality Date  . PARTIAL MASTECTOMY WITH NEEDLE LOCALIZATION AND AXILLARY SENTINEL LYMPH NODE BX Left 02/03/2020   Procedure: PARTIAL MASTECTOMY WITH NEEDLE LOCALIZATION AND AXILLARY SENTINEL LYMPH NODE  BX;  Surgeon: Virl Cagey, MD;  Location: [facility];  Service: General;  Laterality: Left;  . THYROIDECTOMY    . TONSILECTOMY, ADENOIDECTOMY, BILATERAL MYRINGOTOMY AND TUBES      SOCIAL HISTORY: Social History   Socioeconomic History  . Marital status: Married    Spouse name: Elta Guadeloupe   . Number of children: 2  . Years of education: 62  . Highest education level: Not on file  Occupational History  . Occupation: Therapist, sports  Tobacco Use  . Smoking status: Never Smoker  . Smokeless tobacco: Never Used  Substance and Sexual Activity  . Alcohol use: Yes    Comment: wine socially  . Drug use: No  . Sexual activity: Yes    Birth control/protection: Other-see comments    Comment: vasectomy  Other Topics Concern  . Not on file  Social History Narrative   Lives at home with husband and children.   Caffeine use: 1 cup per day   Right handed   Social Determinants of Health   Financial Resource Strain:   . Difficulty of Paying Living Expenses:   Food Insecurity:   . Worried About Charity fundraiser in the Last Year:   . Arboriculturist in the Last Year:   Transportation Needs:   . Film/video editor (Medical):   Marland Kitchen Lack of Transportation (Non-Medical):   Physical Activity:   . Days of Exercise per Week:   . Minutes of Exercise per Session:   Stress:   . Feeling of Stress :   Social Connections:   . Frequency of Communication with Friends and  Family:   . Frequency of Social Gatherings with Friends and Family:   . Attends Religious Services:   . Active Member of Clubs or Organizations:   . Attends Archivist Meetings:   Marland Kitchen Marital Status:   Intimate Partner Violence:   . Fear of Current or Ex-Partner:   . Emotionally Abused:   Marland Kitchen Physically Abused:   . Sexually Abused:     FAMILY HISTORY: Family History  Problem Relation Age of Onset  . Heart disease Father        heart attack  . Diabetes Brother   . Migraines Neg Hx     ALLERGIES:  is allergic to codeine;  levaquin [levofloxacin in d5w]; and sulfa antibiotics.  MEDICATIONS:  Current Outpatient Medications  Medication Sig Dispense Refill  . Ascorbic Acid (VITAMIN C) 1000 MG tablet Take 1,000 mg by mouth daily.    Marland Kitchen aspirin-acetaminophen-caffeine (EXCEDRIN MIGRAINE) 250-250-65 MG tablet Take 1 tablet by mouth every 6 (six) hours as needed for headache.     . Calcium-Magnesium-Vitamin D (CALCIUM MAGNESIUM PO) Take 3 tablets by mouth daily.    . Diclofenac Potassium,Migraine, 50 MG PACK Take 50 mg by mouth once as needed. For migraine. (Patient taking differently: Take 50 mg by mouth daily as needed (migraine). ) 9 each 11  . docusate sodium (COLACE) 100 MG capsule Take 1 capsule (100 mg total) by mouth 2 (two) times daily. 60 capsule 2  . Galcanezumab-gnlm (EMGALITY) 120 MG/ML SOAJ Inject 120 mg into the skin every 30 (thirty) days. 1 pen 11  . ibuprofen (ADVIL,MOTRIN) 200 MG tablet Take 400 mg by mouth every 6 (six) hours as needed for moderate pain.     Marland Kitchen levothyroxine (SYNTHROID) 100 MCG tablet Take 100 mcg by mouth daily before breakfast.     . naproxen (NAPROSYN) 500 MG tablet Take with Imitrex for migraine or vertigo. (Patient taking differently: Take 500 mg by mouth daily as needed (migraine or vertigo). ) 60 tablet 12  . ondansetron (ZOFRAN) 4 MG tablet Take 1 tablet (4 mg total) by mouth every 8 (eight) hours as needed for nausea or vomiting. 30 tablet 1  . oxyCODONE (ROXICODONE) 5 MG immediate release tablet Take 1 tablet (5 mg total) by mouth every 4 (four) hours as needed for severe pain or breakthrough pain. 15 tablet 0  . Rimegepant Sulfate (NURTEC) 75 MG TBDP Take 75 mg by mouth daily as needed. For migraines. Take as close to onset of migraine as possible. One daily maximum. (Patient not taking: Reported on 01/23/2020) 10 tablet 6  . SUMAtriptan (IMITREX) 100 MG tablet TAKE 1 TABLET BY MOUTH ONCE AS NEEDED. MAY REPEAT IN 2 HOURS IF HEADACHE PERSISTS OR RECURS. MAX 2 TAB/DAY. NEED YEARLY  FOLLOW-UP. (Patient taking differently: Take 100 mg by mouth every 2 (two) hours as needed for migraine. ) 10 tablet 0  . VITAMIN D PO Take 1 tablet by mouth daily.      No current facility-administered medications for this visit.    REVIEW OF SYSTEMS:   Constitutional: Denies fevers, chills or abnormal night sweats Eyes: Denies blurriness of vision, double vision or watery eyes Ears, nose, mouth, throat, and face: Denies mucositis or sore throat Respiratory: Denies cough, dyspnea or wheezes Cardiovascular: Denies palpitation, chest discomfort or lower extremity swelling Gastrointestinal:  Denies nausea, heartburn or change in bowel habits Skin: Denies abnormal skin rashes Lymphatics: Denies new lymphadenopathy or easy bruising Neurological:Denies numbness, tingling or new weaknesses Behavioral/Psych: Mood is stable, no  new changes  Breast: s/p left lumpectomy  All other systems were reviewed with the patient and are negative.  PHYSICAL EXAMINATION: ECOG PERFORMANCE STATUS: 0 - Asymptomatic  Vitals:   02/17/20 1303  BP: 110/76  Pulse: 82  Resp: 18  Temp: 98.7 F (37.1 C)  SpO2: 100%   Filed Weights   02/17/20 1303  Weight: 142 lb 4.8 oz (64.5 kg)    GENERAL:alert, no distress and comfortable SKIN: skin color, texture, turgor are normal, no rashes or significant lesions EYES: normal, conjunctiva are pink and non-injected, sclera clear OROPHARYNX:no exudate, no erythema and lips, buccal mucosa, and tongue normal  NECK: supple, thyroid normal size, non-tender, without nodularity LYMPH:  no palpable lymphadenopathy in the cervical, axillary or inguinal LUNGS: clear to auscultation and percussion with normal breathing effort HEART: regular rate & rhythm and no murmurs and no lower extremity edema ABDOMEN:abdomen soft, non-tender and normal bowel sounds Musculoskeletal:no cyanosis of digits and no clubbing  PSYCH: alert & oriented x 3 with fluent speech NEURO: no focal  motor/sensory deficits  LABORATORY DATA:  I have reviewed the data as listed Lab Results  Component Value Date   WBC 4.6 01/13/2020   HGB 14.0 01/13/2020   HCT 41.0 01/13/2020   MCV 95.9 01/13/2020   PLT 231.0 01/13/2020   Lab Results  Component Value Date   NA 139 01/13/2020   K 3.9 01/13/2020   CL 104 01/13/2020   CO2 28 01/13/2020    RADIOGRAPHIC STUDIES: I have personally reviewed the radiological reports and agreed with the findings in the report.  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 Diagnostic mammogram showed a 0.5cm group of calcifications in the upper-outer left breast. Biopsy showed high grade DCIS, ER+ 100%, PR+ 90%.  02/03/2020: Left lumpectomy: High-grade DCIS 1.2 cm, 1 axillary lymph node negative, margins negative  Pathology counseling: I discussed the final pathology report of the patient provided  a copy of this report. I discussed the margins as well as lymph node surgeries. We also discussed the final staging along with previously performed ER/PR and testing.  Recommendation: 1.  Adjuvant radiation therapy followed by 2.  Adjuvant antiestrogen therapy with anastrozole 1 mg daily x5 years s  We discussed both anastrozole and tamoxifen therapies which are felt to be equivalent in DCIS.  However based upon toxicity profile, given her significant family history of coronary artery disease, we decided that anastrozole would be the preferred treatment. We will need a baseline bone density test.   Patient's husband Dr. Aviva Signs is our surgeon at [facility] I communicated with Dr. [name] who performed her surgery and we discussed the treatment plan  We will request genetics consultation because of her personal history of thyroid cancer.  This could be arranged at the same time as radiation consultation. Return to clinic after radiation  to start anastrozole.  All questions were answered. The patient knows to call the clinic with any problems, questions or concerns.   Rulon Eisenmenger, MD, MPH 02/17/2020    I, Molly Dorshimer, am acting as scribe for Nicholas Lose, MD.  I have reviewed the above documentation for accuracy and completeness, and I agree with the above.

## 2020-02-17 ENCOUNTER — Inpatient Hospital Stay: Payer: 59 | Attending: Hematology and Oncology | Admitting: Hematology and Oncology

## 2020-02-17 ENCOUNTER — Encounter: Payer: Self-pay | Admitting: *Deleted

## 2020-02-17 ENCOUNTER — Other Ambulatory Visit: Payer: Self-pay

## 2020-02-17 DIAGNOSIS — D0512 Intraductal carcinoma in situ of left breast: Secondary | ICD-10-CM | POA: Diagnosis not present

## 2020-02-17 DIAGNOSIS — Z85850 Personal history of malignant neoplasm of thyroid: Secondary | ICD-10-CM | POA: Diagnosis not present

## 2020-02-17 DIAGNOSIS — M545 Low back pain: Secondary | ICD-10-CM | POA: Diagnosis not present

## 2020-02-17 DIAGNOSIS — Z170 Estrogen receptor positive status [ER+]: Secondary | ICD-10-CM | POA: Diagnosis not present

## 2020-02-17 DIAGNOSIS — M9903 Segmental and somatic dysfunction of lumbar region: Secondary | ICD-10-CM | POA: Diagnosis not present

## 2020-02-17 DIAGNOSIS — M9902 Segmental and somatic dysfunction of thoracic region: Secondary | ICD-10-CM | POA: Diagnosis not present

## 2020-02-17 DIAGNOSIS — M9901 Segmental and somatic dysfunction of cervical region: Secondary | ICD-10-CM | POA: Diagnosis not present

## 2020-02-17 DIAGNOSIS — M542 Cervicalgia: Secondary | ICD-10-CM | POA: Diagnosis not present

## 2020-02-17 DIAGNOSIS — M9904 Segmental and somatic dysfunction of sacral region: Secondary | ICD-10-CM | POA: Diagnosis not present

## 2020-02-17 DIAGNOSIS — M9905 Segmental and somatic dysfunction of pelvic region: Secondary | ICD-10-CM | POA: Diagnosis not present

## 2020-02-17 DIAGNOSIS — M546 Pain in thoracic spine: Secondary | ICD-10-CM | POA: Diagnosis not present

## 2020-02-17 NOTE — Assessment & Plan Note (Signed)
01/29/2020:Screening mammogram showed left breast calcifications. Diagnostic mammogram showed a 0.5cm group of calcifications in the upper-outer left breast. Biopsy showed high grade DCIS, ER+ 100%, PR+ 90%.  02/03/2020: Left lumpectomy: High-grade DCIS 1.2 cm, 1 axillary lymph node negative, margins negative  Pathology counseling: I discussed the final pathology report of the patient provided  a copy of this report. I discussed the margins as well as lymph node surgeries. We also discussed the final staging along with previously performed ER/PR and testing.  Recommendation: 1.  Adjuvant radiation therapy followed by 2.  Adjuvant antiestrogen therapy with tamoxifen 20 mg daily x5 years Tamoxifen counseling:We discussed the risks and benefits of tamoxifen. These include but not limited to insomnia, hot flashes, mood changes, vaginal dryness, and weight gain. Although rare, serious side effects including endometrial cancer, risk of blood clots were also discussed. We strongly believe that the benefits far outweigh the risks. Patient understands these risks and consented to starting treatment. Planned treatment duration is 5 years.    Return to clinic after radiation to start antiestrogens.

## 2020-02-18 ENCOUNTER — Telehealth (INDEPENDENT_AMBULATORY_CARE_PROVIDER_SITE_OTHER): Payer: Self-pay | Admitting: General Surgery

## 2020-02-18 DIAGNOSIS — D0512 Intraductal carcinoma in situ of left breast: Secondary | ICD-10-CM

## 2020-02-19 ENCOUNTER — Other Ambulatory Visit: Payer: Self-pay | Admitting: *Deleted

## 2020-02-19 ENCOUNTER — Telehealth: Payer: Self-pay | Admitting: *Deleted

## 2020-02-19 ENCOUNTER — Encounter: Payer: Self-pay | Admitting: *Deleted

## 2020-02-19 DIAGNOSIS — D0512 Intraductal carcinoma in situ of left breast: Secondary | ICD-10-CM

## 2020-02-19 NOTE — Telephone Encounter (Signed)
Spoke with patient to confirm genetics appt for 5/27 at 9am.  Assessed navigation needs and gave contact information.

## 2020-02-19 NOTE — Progress Notes (Signed)
Location of Breast Cancer: Left Breast DCIS  Did patient present with symptoms (if so, please note symptoms) or was this found on screening mammography?: Screening mammogram  Mammogram 01/10/2020: 0.5 cm group of calcifications in the upper outer left breast.    Histology per Pathology Report: Left Breast 01/20/2020   Receptor Status: ER(+ 100%), PR (+ 90%), Her2-neu (), Ki-()    Past/Anticipated interventions by surgeon, if any: Dr. Ria Comment -Left Lumpectomy 02/03/2020 -1 axillary lymph node negative -Margins negative  Past/Anticipated interventions by medical oncology, if any: Chemotherapy  Dr. [name] 02/17/2020 -Adjuvant radiation therapy followed by adjuvant antiestrogen therapy with anastrozole 1 mg daily x 5 years.   Lymphedema issues, if any:  No   Pain issues, if any:  No  SAFETY ISSUES:  Prior radiation? Thyroid radiation 14 years ago  Pacemaker/ICD? No  Possible current pregnancy? Post menopausal  Is the patient on methotrexate? No  Current Complaints / other details:      Cori Razor, RN 02/19/2020,9:46 AM

## 2020-02-20 ENCOUNTER — Ambulatory Visit
Admission: RE | Admit: 2020-02-20 | Discharge: 2020-02-20 | Disposition: A | Discharge status: Home / Self Care | Payer: 59 | Source: Ambulatory Visit | Attending: Radiation Oncology | Admitting: Radiation Oncology

## 2020-02-20 ENCOUNTER — Inpatient Hospital Stay (HOSPITAL_BASED_OUTPATIENT_CLINIC_OR_DEPARTMENT_OTHER): Payer: 59 | Admitting: Genetic Counselor

## 2020-02-20 ENCOUNTER — Encounter: Payer: Self-pay | Admitting: Radiation Oncology

## 2020-02-20 ENCOUNTER — Inpatient Hospital Stay: Payer: 59

## 2020-02-20 ENCOUNTER — Other Ambulatory Visit: Payer: Self-pay

## 2020-02-20 ENCOUNTER — Encounter: Payer: Self-pay | Admitting: *Deleted

## 2020-02-20 VITALS — BP 110/76 | HR 83 | Temp 98.8°F | Resp 16 | Ht 65.0 in | Wt 142.6 lb

## 2020-02-20 DIAGNOSIS — Z9012 Acquired absence of left breast and nipple: Secondary | ICD-10-CM | POA: Insufficient documentation

## 2020-02-20 DIAGNOSIS — D0512 Intraductal carcinoma in situ of left breast: Secondary | ICD-10-CM | POA: Insufficient documentation

## 2020-02-20 DIAGNOSIS — Z79899 Other long term (current) drug therapy: Secondary | ICD-10-CM | POA: Diagnosis not present

## 2020-02-20 DIAGNOSIS — M129 Arthropathy, unspecified: Secondary | ICD-10-CM | POA: Insufficient documentation

## 2020-02-20 DIAGNOSIS — Z85850 Personal history of malignant neoplasm of thyroid: Secondary | ICD-10-CM | POA: Insufficient documentation

## 2020-02-20 DIAGNOSIS — Z800 Family history of malignant neoplasm of digestive organs: Secondary | ICD-10-CM

## 2020-02-20 DIAGNOSIS — Z170 Estrogen receptor positive status [ER+]: Secondary | ICD-10-CM | POA: Diagnosis not present

## 2020-02-20 DIAGNOSIS — E7800 Pure hypercholesterolemia, unspecified: Secondary | ICD-10-CM | POA: Insufficient documentation

## 2020-02-20 DIAGNOSIS — Z808 Family history of malignant neoplasm of other organs or systems: Secondary | ICD-10-CM | POA: Diagnosis not present

## 2020-02-20 DIAGNOSIS — Z803 Family history of malignant neoplasm of breast: Secondary | ICD-10-CM

## 2020-02-20 DIAGNOSIS — Z9889 Other specified postprocedural states: Secondary | ICD-10-CM | POA: Diagnosis not present

## 2020-02-20 DIAGNOSIS — Z923 Personal history of irradiation: Secondary | ICD-10-CM | POA: Diagnosis not present

## 2020-02-20 LAB — GENETIC SCREENING ORDER

## 2020-02-20 NOTE — Telephone Encounter (Signed)
Approved today  The request has been approved. The authorization is effective for a maximum of 12 fills from 02/20/2020 to 02/18/2021, as long as the member is enrolled in their current health plan. This has been approved for a quantity limit of 1.0 with a day supply limit of 30.0. A written notification letter will follow with additional details.

## 2020-02-20 NOTE — Progress Notes (Signed)
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   HISTORY OF PRESENT ILLNESS: Gwendolyn Flores is a 54 y.o. female with a new diagnosis of left breast cancer. The patient was noted to have a screening abnormality on her mammogram, this appeared to be a 5 mm group of calcifications in the upper outer quadrant of the left breast.  She underwent stereotactic biopsy on 01/20/2020. Which revealed high-grade DCIS with calcifications and necrosis, the tumor was ER/PR positive.  She subsequently with Dr. [name] and proceeded with lumpectomy and sentinel node biopsy on 02/03/2020.  This revealed high-grade DCIS spanning at least 1.2 cm.  Her margins were clear, though the closest was 1 mm to the superior margin.  Additional medial, superior, deep, inferior, and lateral margins were all negative for disease.  She had one sampled lymph node in the left axilla that was negative for disease as well.  She met with Dr. [name] earlier in the week to discuss adjuvant recommendations, she plans to begin antiestrogen medication under his care, and is seen today to discuss options of adjuvant radiotherapy.    PREVIOUS RADIATION THERAPY: No  She was however given radioactive iodine to treat her thyroid cancer.   PAST MEDICAL HISTORY:  Past Medical History:  Diagnosis Date  . Arthritis   . Cancer [facility])    thyroid cancer  . Chicken pox   . Headache   . High cholesterol   . History of thyroid cancer 06/28/2013  . Rectocele 06/28/2013  . Thyroid disease    cancer  . Urinary tract infection        PAST SURGICAL HISTORY: Past Surgical History:  Procedure Laterality Date  . PARTIAL  MASTECTOMY WITH NEEDLE LOCALIZATION AND AXILLARY SENTINEL LYMPH NODE BX Left 02/03/2020   Procedure: PARTIAL MASTECTOMY WITH NEEDLE LOCALIZATION AND AXILLARY SENTINEL LYMPH NODE BX;  Surgeon: Virl Cagey, MD;  Location: [facility];  Service: General;  Laterality: Left;  . THYROIDECTOMY    . TONSILECTOMY, ADENOIDECTOMY, BILATERAL MYRINGOTOMY AND TUBES       FAMILY HISTORY:  Family History  Problem Relation Age of Onset  . Heart disease Father        heart attack  . Diabetes Brother   . Migraines Neg Hx      SOCIAL HISTORY:  reports that she has never smoked. She has never used smokeless tobacco. She reports current alcohol use. She reports that she does not use drugs.  The patient is married.  She lives in [place].  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  Her husband is a Education officer, environmental at [employer].   ALLERGIES: Codeine, Levaquin [levofloxacin in d5w], and Sulfa antibiotics   MEDICATIONS:  Current Outpatient Medications  Medication Sig Dispense Refill  . Ascorbic Acid (VITAMIN C) 1000 MG tablet Take 1,000 mg by mouth daily.    Marland Kitchen aspirin-acetaminophen-caffeine (EXCEDRIN MIGRAINE) 250-250-65 MG tablet Take 1 tablet by mouth every 6 (six) hours as needed for headache.     . Calcium-Magnesium-Vitamin D (CALCIUM MAGNESIUM PO) Take 3 tablets by mouth daily.    . Diclofenac Potassium,Migraine,  50 MG PACK Take 50 mg by mouth once as needed. For migraine. (Patient taking differently: Take 50 mg by mouth daily as needed (migraine). ) 9 each 11  . docusate sodium (COLACE) 100 MG capsule Take 1 capsule (100 mg total) by mouth 2 (two) times daily. 60 capsule 2  . Galcanezumab-gnlm (EMGALITY) 120 MG/ML SOAJ Inject 120 mg into the skin every 30 (thirty) days. 1 pen 11  . ibuprofen (ADVIL,MOTRIN) 200 MG tablet Take 400 mg by mouth every 6 (six) hours as needed for moderate pain.     Marland Kitchen levothyroxine (SYNTHROID) 100 MCG tablet Take 100  mcg by mouth daily before breakfast.     . naproxen (NAPROSYN) 500 MG tablet Take with Imitrex for migraine or vertigo. (Patient taking differently: Take 500 mg by mouth daily as needed (migraine or vertigo). ) 60 tablet 12  . ondansetron (ZOFRAN) 4 MG tablet Take 1 tablet (4 mg total) by mouth every 8 (eight) hours as needed for nausea or vomiting. 30 tablet 1  . oxyCODONE (ROXICODONE) 5 MG immediate release tablet Take 1 tablet (5 mg total) by mouth every 4 (four) hours as needed for severe pain or breakthrough pain. 15 tablet 0  . Rimegepant Sulfate (NURTEC) 75 MG TBDP Take 75 mg by mouth daily as needed. For migraines. Take as close to onset of migraine as possible. One daily maximum. (Patient not taking: Reported on 01/23/2020) 10 tablet 6  . SUMAtriptan (IMITREX) 100 MG tablet TAKE 1 TABLET BY MOUTH ONCE AS NEEDED. MAY REPEAT IN 2 HOURS IF HEADACHE PERSISTS OR RECURS. MAX 2 TAB/DAY. NEED YEARLY FOLLOW-UP. (Patient taking differently: Take 100 mg by mouth every 2 (two) hours as needed for migraine. ) 10 tablet 0  . VITAMIN D PO Take 1 tablet by mouth daily.      No current facility-administered medications for this encounter.     REVIEW OF SYSTEMS: On review of systems, the patient reports that she is doing well overall.  She reports that things are going well surgically, she feels as though she is healing well without concerns of infection or fluid buildup in the surgical site.  No other complaints are verbalized.     PHYSICAL EXAM:  Wt Readings from Last 3 Encounters:  02/17/20 142 lb 4.8 oz (64.5 kg)  02/03/20 138 lb (62.6 kg)  01/10/20 142 lb 12.8 oz (64.8 kg)   Temp Readings from Last 3 Encounters:  02/17/20 98.7 F (37.1 C) (Temporal)  02/05/20 97.9 F (36.6 C)  02/03/20 98.3 F (36.8 C)   BP Readings from Last 3 Encounters:  02/17/20 110/76  02/03/20 109/76  01/10/20 102/78   Pulse Readings from Last 3 Encounters:  02/17/20 82  02/03/20 80  01/10/20 76    In  general this is a well appearing Caucasian female in no acute distress. She's alert and oriented x4 and appropriate throughout the examination. Cardiopulmonary assessment is negative for acute distress and she exhibits normal effort. The left breast incision is intact as is her axillary incision. There is mild induration a small fluctance noted in the upper outer quadrant superior and lateral to her incision without erythema. No pain is noted with palpation.    ECOG = 0  0 - Asymptomatic (Fully active, able to carry on all predisease activities without restriction)  1 - Symptomatic but completely ambulatory (Restricted in physically strenuous activity but ambulatory and able to carry out work of a light or sedentary nature. For example, light housework, office work)  2 - Symptomatic, <50% in bed during the day (Ambulatory and capable of all self care but unable to carry out any work activities. Up and about more than 50% of waking hours)  3 - Symptomatic, >50% in bed, but not bedbound (Capable of only limited self-care, confined to bed or chair 50% or more of waking hours)  4 - Bedbound (Completely disabled. Cannot carry on any self-care. Totally confined to bed or chair)  5 - Death   Eustace Pen MM, Creech RH, Tormey DC, et al. [phone]). "Toxicity and response criteria of the [facility]". [place] Oncol. 5 (6): 649-55    LABORATORY DATA:  Lab Results  Component Value Date   WBC 4.6 01/13/2020   HGB 14.0 01/13/2020   HCT 41.0 01/13/2020   MCV 95.9 01/13/2020   PLT 231.0 01/13/2020   Lab Results  Component Value Date   NA 139 01/13/2020   K 3.9 01/13/2020   CL 104 01/13/2020   CO2 28 01/13/2020   Lab Results  Component Value Date   ALT 18 01/13/2020   AST 18 01/13/2020   ALKPHOS 62 01/13/2020   BILITOT 0.5 01/13/2020      RADIOGRAPHY: NM Sentinel Node Inj-No Rpt (Breast)  Result Date: 02/03/2020 Sulfur colloid was injected by the nuclear medicine  technologist for melanoma sentinel node.   MM BREAST SURGICAL SPECIMEN  Result Date: 02/03/2020 CLINICAL DATA:  DCIS LEFT breast EXAM: SPECIMEN RADIOGRAPH OF THE LEFT BREAST COMPARISON:  Preoperative needle localization images of 02/03/2020 FINDINGS: Status post excision of the LEFT breast. The wire tip and biopsy marker clip are present are present within the surgical specimen. IMPRESSION: Specimen radiograph of the LEFT breast. Electronically Signed   By: Lavonia Dana M.D.   On: 02/03/2020 12:06   MM LT PLC BREAST LOC DEV   1ST LESION  INC MAMMO GUIDE  Result Date: 02/03/2020 CLINICAL DATA:  DCIS LEFT breast EXAM: NEEDLE LOCALIZATION OF THE LEFT BREAST WITH MAMMO GUIDANCE COMPARISON:  01/20/2020 PROCEDURE: Patient presents for needle localization prior to lumpectomy. I met with the patient and we discussed the procedure of needle localization including benefits and alternatives. We discussed the high likelihood of a successful procedure. We discussed the risks of the procedure, including infection, bleeding, tissue injury, and further surgery. Informed, written consent was given. The usual time-out protocol was performed immediately prior to the procedure. Using mammographic guidance, sterile technique, 1% lidocaine and a 5 cm length modified Kopans needle, biopsy clip and adjacent calcifications localized using anterior free hand approach. The images were marked for Dr. [name]. IMPRESSION: Needle localization LEFT breast. No apparent complications. Images reviewed with Dr. [name] prior to surgery. Electronically Signed   By: Lavonia Dana M.D.   On: 02/03/2020 09:56       IMPRESSION/PLAN: 1. ER/PR high-grade DCIS of the left breast. Dr. [name] discusses the pathology findings and reviews the nature of noninvasive left breast disease.  Dr. [name] spent time with the patient today reviewing the rationale for external radiotherapy to the breast followed by antiestrogen therapy. We discussed the risks,  benefits, short, and long term effects of radiotherapy, and the patient is interested in proceeding. Dr. [name] discusses the delivery and logistics of radiotherapy and anticipates a course of 4-6 1/2 weeks of radiotherapy with deep inspiration breath-hold technique. After discussion she is motivated to proceed with the 4 week course. Written consent is obtained and placed in the chart, a copy was provided to the patient.  She  will return for simulation in approximately 2 weeks. 2. Possible genetic predisposition to malignancy. The patient is a candidate for genetic testing and has been referred already to genetic testing.  She will meet with them following this appointment.   In a visit lasting 45 minutes, greater than 50% of the time was spent face to face reviewing her case, as well as in preparation of, discussing, and coordinating the patient's care.  The above documentation reflects my direct findings during this shared patient visit. Please see the separate note by Dr. [name] on this date for the remainder of the patient's plan of care.    Carola Rhine, PAC

## 2020-02-20 NOTE — Telephone Encounter (Signed)
Opened in error

## 2020-02-20 NOTE — Telephone Encounter (Signed)
Emgality renewal PA completed on Cover My Meds. Key: [id number]. Awaiting determination from [employer].

## 2020-02-20 NOTE — Progress Notes (Signed)
[facility]  I am calling the patient for post operative evaluation due to the current COVID 19 pandemic.  The patient had a left breast partial mastectomy and sentinel node biopsy on 02/03/2020. The patient reports that they are doing well. Her swelling is going down and she is moving her arm better. Her incisions are healing well. She has seen Oncology regarding her plan for radiation and hormone therapy.  Pathology: FINAL MICROSCOPIC DIAGNOSIS:   A. SENTINEL LYMPH NODE, LEFT AXILLARY, BIOPSY:  - One lymph node, negative for carcinoma (0/1).   B. BREAST, LEFT, PARTIAL MASTECTOMY:  - Ductal carcinoma in situ, high nuclear grade with central necrosis and  calcifications, spanning at least 1.2 cm.  - Margins of resection are not involved (Closest margin: 1 mm,  superior).  - Biopsy site.  - See oncology table.   C. BREAST, LEFT, ADDITIONAL MEDIAL MARGIN, EXCISION:  - Breast tissue, negative for carcinoma.   D. BREAST, LEFT, ADDITIONAL SUPERIOR MARGIN, EXCISION  - Breast tissue, negative for carcinoma.   E. BREAST, LEFT, ADDITIONAL DEEP MARGIN, EXCISION:  - Breast tissue, negative for carcinoma.   F. BREAST, LEFT, ADDITIONAL INFERIOR MARGIN, EXCISION:  - Breast tissue, negative for carcinoma.   G. BREAST, LEFT, ADDITIONAL LATERAL MARGIN, EXCISION:  - Breast tissue, negative for carcinoma.   Will see the patient PRN.    Curlene Labrum, MD [facility] [address] [place], [place] [address] [phone] (office)

## 2020-02-21 ENCOUNTER — Encounter: Payer: Self-pay | Admitting: Genetic Counselor

## 2020-02-21 DIAGNOSIS — Z800 Family history of malignant neoplasm of digestive organs: Secondary | ICD-10-CM | POA: Insufficient documentation

## 2020-02-21 DIAGNOSIS — Z803 Family history of malignant neoplasm of breast: Secondary | ICD-10-CM | POA: Insufficient documentation

## 2020-02-21 DIAGNOSIS — Z808 Family history of malignant neoplasm of other organs or systems: Secondary | ICD-10-CM | POA: Insufficient documentation

## 2020-02-21 NOTE — Progress Notes (Signed)
REFERRING PROVIDER: Nicholas Lose, MD [address] [place],  [place] [address]  PRIMARY PROVIDER:  Isaac Bliss, Rayford Halsted, MD  PRIMARY REASON FOR VISIT:  1. 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 History of thyroid cancer   3. Family history of breast cancer   4. Family history of stomach cancer   5. Family history of thyroid cancer   6. Family history of colon cancer      HISTORY OF PRESENT ILLNESS:   Ms. Flury, a 54 y.o. female, was seen for a [facility] cancer genetics consultation at the request of Dr. [name] due to a personal and family history of cancer. Ms. Roehrich presents to clinic today to discuss the possibility of a hereditary predisposition to cancer, genetic testing, and to further clarify her future cancer risks, as well as potential cancer risks for family members.   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 The treatment plan included thyroidectomy and RAI therapy.   In April 2021, at the age of 17, Ms. Molinari was diagnosed with DCIS, ER+/PR+, of the left breast. Her treatment plan includes surgery (lumpectomy complete 02/03/20), adjuvant radiation therapy, and adjuvant antiestrogen therapy with anastrozole.   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 Diagnostic mammogram showed a 0.5cm group of calcifications in the upper-outer left breast. Biopsy showed high grade DCIS, ER+ 100%, PR+ 90%.    02/03/2020 Surgery   Left lumpectomy: high grade DCIS, 1.2cm, 1 left axillary lymph node negative for carcinoma.      RISK FACTORS:  Menarche was at age 60.  First live birth at age 20.  OCP use for approximately 22 years.  Ovaries intact: yes.  Hysterectomy: no.  Menopausal status: postmenopausal.   HRT use: 0 years. Colonoscopy: yes; polyps present on colonoscopy in 2020. Mammogram within the last year: yes. Number of breast biopsies: 1. Any excessive radiation exposure in the past: radioactive iodine therapy for thyroid cancer.   Past Medical History:  Diagnosis Date  . Arthritis   . Cancer [facility])    thyroid cancer  . Chicken pox   . Family history of breast cancer   . Family history of colon cancer   . Family history of stomach cancer   . Family history of thyroid cancer   . Headache   . High cholesterol   . History of thyroid cancer 06/28/2013  . Rectocele 06/28/2013  . Thyroid disease    cancer  . Urinary tract infection     Past Surgical History:  Procedure Laterality Date  . EYE SURGERY  11/2019  . PARTIAL MASTECTOMY WITH NEEDLE LOCALIZATION AND AXILLARY SENTINEL LYMPH NODE BX Left 02/03/2020   Procedure: PARTIAL MASTECTOMY WITH NEEDLE LOCALIZATION AND AXILLARY SENTINEL LYMPH NODE BX;  Surgeon: Virl Cagey, MD;  Location: [facility];  Service: General;  Laterality: Left;  . THYROIDECTOMY    . TONSILECTOMY, ADENOIDECTOMY, BILATERAL MYRINGOTOMY AND TUBES      Social History   Socioeconomic History  . Marital status: Married    Spouse name: Elta Guadeloupe   . Number of children: 2  . Years of education: 68  . Highest education level: Not on file  Occupational History  . Occupation: Therapist, sports  Tobacco Use  . Smoking status: Never Smoker  . Smokeless tobacco: Never Used  Substance and Sexual Activity  . Alcohol use: Yes    Comment: wine socially  . Drug use: No  . Sexual activity: Yes    Birth control/protection: Other-see comments    Comment: vasectomy  Other Topics Concern  . Not on file  Social History Narrative   Lives at home with husband and children.   Caffeine use: 1 cup per day   Right handed   Social Determinants of Health   Financial Resource Strain:   . Difficulty of Paying Living Expenses:   Food Insecurity:   . Worried About Charity fundraiser  in the Last Year:   . Arboriculturist in the Last Year:   Transportation Needs:   . Film/video editor (Medical):   Marland Kitchen Lack of Transportation (Non-Medical):   Physical Activity:   . Days of Exercise per Week:   . Minutes of Exercise per Session:   Stress:   . Feeling of Stress :   Social Connections:   . Frequency of Communication with Friends and Family:   . Frequency of Social Gatherings with Friends and Family:   . Attends Religious Services:   . Active Member of Clubs or Organizations:   . Attends Archivist Meetings:   Marland Kitchen Marital Status:      FAMILY HISTORY:  We obtained a detailed, 4-generation family history.  Significant diagnoses are listed below: Family History  Problem Relation Age of Onset  . Heart disease Father        heart attack  . Diabetes Brother   . Stomach cancer Paternal Grandfather 72  . Congestive Heart Failure Paternal Grandmother   . Dementia Maternal Grandmother   . ALS Maternal Grandfather   . Thyroid cancer Other        dx. >50 (maternal grandmother's sister)  . Stroke Maternal Uncle   . Breast cancer Cousin        dx. in her 18s (paternal cousin)  . Hydrocephalus Paternal Uncle        "special needs"  . Colon cancer Maternal Great-grandmother 48       maternal grandmother's mother  . Migraines Neg Hx    Ms. Diefenderfer has one daughter (age 59) and one son (age 11). She has one brother (age 61). None of these family members have had cancer.  Ms. Swingler mother is 49 and has not had cancer, although she did have a precancerous breast lesion within the last year and is taking tamoxifen. Ms. Willison had two maternal uncles, neither of whom have had cancer. Her maternal grandmother died at the age of 26, and her maternal grandfather died at the age of 71 from [place]. Her grandmother had a sister with thyroid cancer when she was older than 88, and her grandmother's mother died at the age of 77 from colon cancer.   Ms. Marini father died  at the age of 39 due to a heart attack after surgery for diverticulitis. She had five paternal uncles and two paternal aunts, none of whom had cancer. One cousin had breast cancer in her 33s. Her paternal grandmother died in her 50s, and her paternal grandfather died at the age of 46 from stomach cancer.   Ms. Char is unaware of previous family history of genetic testing for hereditary cancer risks. Her maternal ancestors are of unknown descent, and paternal ancestors are of [place] descent. There is no reported Ashkenazi Jewish ancestry. There is no known consanguinity.  GENETIC COUNSELING ASSESSMENT: Ms. Wince is  a 54 y.o. female with a personal history of breast cancer and thyroid cancer, and a family history of breast, stomach, colon, and thyroid cancer which is not suggestive of a hereditary cancer syndrome. We, therefore, discussed and recommended the following at today's visit.   DISCUSSION: We discussed that 5-10% of breast cancer is hereditary, with most cases associated with the BRCA1 and BRCA2 genes. There are other genes that can be associated with hereditary breast cancer syndromes. These include ATM, CHEK2, PALB2, PTEN, etc. We discussed that identifying a hereditary cancer syndrome can be beneficial for several reasons, including knowing about other cancer risks, identifying potential screening and risk-reduction options that may be appropriate, and to understand if other family members could be at risk for cancer and allow them to undergo genetic testing.   We reviewed the characteristics, features and inheritance patterns of hereditary cancer syndromes. We discussed with Ms. Coryell that the personal and family history does not meet insurance or NCCN criteria for genetic testing and, therefore, is not highly consistent with a familial hereditary cancer syndrome. We feel she is at low risk to harbor a gene mutation associated with such a condition. Ms. Falconi would still like to proceed  with genetic testing at this time. Therefore, we discussed genetic testing, including the appropriate family members to test, the process of testing, and turn-around-time for results. We discussed the implications of a negative, positive and/or variant of uncertain significant result. Because she does not meet insurance criteria for testing, the out of pocket cost for genetic testing will be $250.   We recommended that Ms. Adline Mango pursue genetic testing for the [facility] Common Hereditary Cancers panel. The Common Hereditary Cancers Panel offered by [facility] includes sequencing and/or deletion duplication testing of the following 48 genes: APC, ATM, AXIN2, BARD1, BMPR1A, BRCA1, BRCA2, BRIP1, CDH1, CDK4, CDKN2A (p14ARF), CDKN2A (p16INK4a), CHEK2, CTNNA1, DICER1, EPCAM (Deletion/duplication testing only), GREM1 (promoter region deletion/duplication testing only), KIT, MEN1, MLH1, MSH2, MSH3, MSH6, MUTYH, NBN, NF1, NHTL1, PALB2, PDGFRA, PMS2, POLD1, POLE, PTEN, RAD50, RAD51C, RAD51D, RNF43, SDHB, SDHC, SDHD, SMAD4, SMARCA4. STK11, TP53, TSC1, TSC2, and VHL.  The following genes are evaluated for sequence changes only: SDHA and HOXB13 c.251G>A variant only.  PLAN: After considering the risks, benefits, and limitations, Ms. Nash provided informed consent to pursue genetic testing and the blood sample was sent to [facility] for analysis of the Common Hereditary Cancers Panel. Results should be available within approximately two-three weeks' time, at which point they will be disclosed by telephone to Ms. Topor, as will any additional recommendations warranted by these results. Ms. Duskin will receive a summary of her genetic counseling visit and a copy of her results once available. This information will also be available in Epic.   Ms. Capshaw questions were answered to her satisfaction today. Our contact information was provided should additional questions or concerns arise. Thank you for the  referral and allowing [place] to share in the care of your patient.   Clint Guy, [place], [facility] Licensed, Certified Dispensing [email or web address] Phone: [phone]  The patient was seen for a total of 40 minutes in face-to-face genetic counseling.  This patient was discussed with Drs. Magrinat, [name] and/or Burr Medico who agrees with the above.    _______________________________________________________________________ For Office Staff:  Number of people involved in session: 1 Was an Intern/ student involved with case: no

## 2020-02-25 ENCOUNTER — Encounter: Payer: Self-pay | Admitting: Licensed Clinical Social Worker

## 2020-02-25 NOTE — Progress Notes (Signed)
[facility] Clinical Social Work  Holiday representative contacted patient by phone  to offer support and assess for needs for new patient. Patient reports that she is doing well so far. 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 She has good support from her husband and two children. No needs identified at this time.   CSW informed patient of the support team and support services at [facility]. Patient elected to sign up for monthly cooking class. CSW provided contact information and encouraged patient to call with any questions or concerns.     Sanye Ledesma, [place], [place] Worker [facility]

## 2020-02-26 ENCOUNTER — Encounter: Payer: Self-pay | Admitting: *Deleted

## 2020-02-27 ENCOUNTER — Telehealth: Payer: Self-pay | Admitting: Hematology and Oncology

## 2020-02-27 NOTE — Telephone Encounter (Signed)
Scheduled per 6/2 sch message. Unable to contact pt. Left voicemail- appt added on 7/7.

## 2020-02-28 MED FILL — EMGALITY 120 MG/ML SOAJ: 120 | 30 days supply | Qty: 1 | Fill #4

## 2020-03-02 ENCOUNTER — Other Ambulatory Visit: Payer: Self-pay

## 2020-03-02 ENCOUNTER — Ambulatory Visit
Admission: RE | Admit: 2020-03-02 | Discharge: 2020-03-02 | Disposition: A | Discharge status: Home / Self Care | Payer: 59 | Source: Ambulatory Visit | Attending: Radiation Oncology | Admitting: Radiation Oncology

## 2020-03-02 DIAGNOSIS — M9902 Segmental and somatic dysfunction of thoracic region: Secondary | ICD-10-CM | POA: Diagnosis not present

## 2020-03-02 DIAGNOSIS — M546 Pain in thoracic spine: Secondary | ICD-10-CM | POA: Diagnosis not present

## 2020-03-02 DIAGNOSIS — M542 Cervicalgia: Secondary | ICD-10-CM | POA: Diagnosis not present

## 2020-03-02 DIAGNOSIS — Z510 Encounter for antineoplastic radiation therapy: Secondary | ICD-10-CM | POA: Insufficient documentation

## 2020-03-02 DIAGNOSIS — D0512 Intraductal carcinoma in situ of left breast: Secondary | ICD-10-CM | POA: Insufficient documentation

## 2020-03-02 DIAGNOSIS — M9903 Segmental and somatic dysfunction of lumbar region: Secondary | ICD-10-CM | POA: Diagnosis not present

## 2020-03-02 DIAGNOSIS — M9905 Segmental and somatic dysfunction of pelvic region: Secondary | ICD-10-CM | POA: Diagnosis not present

## 2020-03-02 DIAGNOSIS — M9901 Segmental and somatic dysfunction of cervical region: Secondary | ICD-10-CM | POA: Diagnosis not present

## 2020-03-02 DIAGNOSIS — Z170 Estrogen receptor positive status [ER+]: Secondary | ICD-10-CM | POA: Diagnosis not present

## 2020-03-02 DIAGNOSIS — M545 Low back pain: Secondary | ICD-10-CM | POA: Diagnosis not present

## 2020-03-02 DIAGNOSIS — M9904 Segmental and somatic dysfunction of sacral region: Secondary | ICD-10-CM | POA: Diagnosis not present

## 2020-03-06 DIAGNOSIS — Z510 Encounter for antineoplastic radiation therapy: Secondary | ICD-10-CM | POA: Diagnosis not present

## 2020-03-06 DIAGNOSIS — D0512 Intraductal carcinoma in situ of left breast: Secondary | ICD-10-CM | POA: Diagnosis not present

## 2020-03-06 DIAGNOSIS — Z170 Estrogen receptor positive status [ER+]: Secondary | ICD-10-CM | POA: Diagnosis not present

## 2020-03-09 ENCOUNTER — Ambulatory Visit
Admission: RE | Admit: 2020-03-09 | Discharge: 2020-03-09 | Disposition: A | Discharge status: Home / Self Care | Payer: 59 | Source: Ambulatory Visit | Attending: Radiation Oncology | Admitting: Radiation Oncology

## 2020-03-09 ENCOUNTER — Other Ambulatory Visit: Payer: Self-pay

## 2020-03-09 DIAGNOSIS — Z510 Encounter for antineoplastic radiation therapy: Secondary | ICD-10-CM | POA: Diagnosis not present

## 2020-03-09 DIAGNOSIS — D0512 Intraductal carcinoma in situ of left breast: Secondary | ICD-10-CM | POA: Diagnosis not present

## 2020-03-09 DIAGNOSIS — Z170 Estrogen receptor positive status [ER+]: Secondary | ICD-10-CM | POA: Diagnosis not present

## 2020-03-10 ENCOUNTER — Other Ambulatory Visit: Payer: Self-pay

## 2020-03-10 ENCOUNTER — Ambulatory Visit
Admission: RE | Admit: 2020-03-10 | Discharge: 2020-03-10 | Disposition: A | Discharge status: Home / Self Care | Payer: 59 | Source: Ambulatory Visit | Attending: Radiation Oncology | Admitting: Radiation Oncology

## 2020-03-10 DIAGNOSIS — Z510 Encounter for antineoplastic radiation therapy: Secondary | ICD-10-CM | POA: Diagnosis not present

## 2020-03-10 DIAGNOSIS — Z170 Estrogen receptor positive status [ER+]: Secondary | ICD-10-CM | POA: Diagnosis not present

## 2020-03-10 DIAGNOSIS — D0512 Intraductal carcinoma in situ of left breast: Secondary | ICD-10-CM | POA: Diagnosis not present

## 2020-03-11 ENCOUNTER — Ambulatory Visit
Admission: RE | Admit: 2020-03-11 | Discharge: 2020-03-11 | Disposition: A | Discharge status: Home / Self Care | Payer: 59 | Source: Ambulatory Visit | Attending: Radiation Oncology | Admitting: Radiation Oncology

## 2020-03-11 ENCOUNTER — Other Ambulatory Visit: Payer: Self-pay

## 2020-03-11 DIAGNOSIS — Z170 Estrogen receptor positive status [ER+]: Secondary | ICD-10-CM | POA: Diagnosis not present

## 2020-03-11 DIAGNOSIS — Z510 Encounter for antineoplastic radiation therapy: Secondary | ICD-10-CM | POA: Diagnosis not present

## 2020-03-11 DIAGNOSIS — D0512 Intraductal carcinoma in situ of left breast: Secondary | ICD-10-CM | POA: Diagnosis not present

## 2020-03-12 ENCOUNTER — Ambulatory Visit
Admission: RE | Admit: 2020-03-12 | Discharge: 2020-03-12 | Disposition: A | Discharge status: Home / Self Care | Payer: 59 | Source: Ambulatory Visit | Attending: Radiation Oncology | Admitting: Radiation Oncology

## 2020-03-12 ENCOUNTER — Other Ambulatory Visit: Payer: Self-pay

## 2020-03-12 DIAGNOSIS — D0512 Intraductal carcinoma in situ of left breast: Secondary | ICD-10-CM | POA: Diagnosis not present

## 2020-03-12 DIAGNOSIS — Z510 Encounter for antineoplastic radiation therapy: Secondary | ICD-10-CM | POA: Diagnosis not present

## 2020-03-12 DIAGNOSIS — Z170 Estrogen receptor positive status [ER+]: Secondary | ICD-10-CM | POA: Diagnosis not present

## 2020-03-12 NOTE — Progress Notes (Signed)
Pt here for patient teaching.  Pt given Radiation and You booklet, skin care instructions and Alra deodorant.  Reviewed areas of pertinence such as fatigue, hair loss, skin changes, breast tenderness and breast swelling . Pt able to give teach back of to pat skin and use unscented/gentle soap,avoid applying anything to skin within 4 hours of treatment, avoid wearing an under wire bra and to use an electric razor if they must shave. Pt verbalizes understanding of information given and will contact nursing with any questions or concerns.     Gloriajean Dell. Leonie Green, BSN

## 2020-03-13 ENCOUNTER — Ambulatory Visit
Admission: RE | Admit: 2020-03-13 | Discharge: 2020-03-13 | Disposition: A | Discharge status: Home / Self Care | Payer: 59 | Source: Ambulatory Visit | Attending: Radiation Oncology | Admitting: Radiation Oncology

## 2020-03-13 ENCOUNTER — Other Ambulatory Visit: Payer: Self-pay

## 2020-03-13 DIAGNOSIS — D0512 Intraductal carcinoma in situ of left breast: Secondary | ICD-10-CM | POA: Diagnosis not present

## 2020-03-13 DIAGNOSIS — Z170 Estrogen receptor positive status [ER+]: Secondary | ICD-10-CM | POA: Diagnosis not present

## 2020-03-13 DIAGNOSIS — Z510 Encounter for antineoplastic radiation therapy: Secondary | ICD-10-CM | POA: Diagnosis not present

## 2020-03-13 MED ORDER — SONAFINE EX EMUL
1.0000 "application " | Freq: Once | CUTANEOUS | Status: AC
  Administered 2020-03-13: 1 via TOPICAL

## 2020-03-16 ENCOUNTER — Ambulatory Visit
Admission: RE | Admit: 2020-03-16 | Discharge: 2020-03-16 | Disposition: A | Discharge status: Home / Self Care | Payer: 59 | Source: Ambulatory Visit | Attending: Radiation Oncology | Admitting: Radiation Oncology

## 2020-03-16 ENCOUNTER — Other Ambulatory Visit: Payer: Self-pay

## 2020-03-16 ENCOUNTER — Other Ambulatory Visit: Payer: Self-pay | Admitting: Genetic Counselor

## 2020-03-16 ENCOUNTER — Telehealth: Payer: Self-pay | Admitting: Genetic Counselor

## 2020-03-16 DIAGNOSIS — D0512 Intraductal carcinoma in situ of left breast: Secondary | ICD-10-CM | POA: Diagnosis not present

## 2020-03-16 DIAGNOSIS — Z170 Estrogen receptor positive status [ER+]: Secondary | ICD-10-CM | POA: Diagnosis not present

## 2020-03-16 DIAGNOSIS — Z510 Encounter for antineoplastic radiation therapy: Secondary | ICD-10-CM | POA: Diagnosis not present

## 2020-03-16 NOTE — Telephone Encounter (Signed)
Discussed with Gwendolyn Flores that the genetic testing laboratory ([facility]) did not receive her blood sample, and set up an appointment to have her blood redrawn on Friday 6/25 at 1pm.

## 2020-03-17 ENCOUNTER — Ambulatory Visit
Admission: RE | Admit: 2020-03-17 | Discharge: 2020-03-17 | Disposition: A | Discharge status: Home / Self Care | Payer: 59 | Source: Ambulatory Visit | Attending: Radiation Oncology | Admitting: Radiation Oncology

## 2020-03-17 ENCOUNTER — Other Ambulatory Visit: Payer: Self-pay

## 2020-03-17 DIAGNOSIS — Z510 Encounter for antineoplastic radiation therapy: Secondary | ICD-10-CM | POA: Diagnosis not present

## 2020-03-17 DIAGNOSIS — D0512 Intraductal carcinoma in situ of left breast: Secondary | ICD-10-CM | POA: Diagnosis not present

## 2020-03-17 DIAGNOSIS — Z170 Estrogen receptor positive status [ER+]: Secondary | ICD-10-CM | POA: Diagnosis not present

## 2020-03-18 ENCOUNTER — Ambulatory Visit
Admission: RE | Admit: 2020-03-18 | Discharge: 2020-03-18 | Disposition: A | Discharge status: Home / Self Care | Payer: 59 | Source: Ambulatory Visit | Attending: Radiation Oncology | Admitting: Radiation Oncology

## 2020-03-18 ENCOUNTER — Other Ambulatory Visit: Payer: Self-pay

## 2020-03-18 DIAGNOSIS — D0512 Intraductal carcinoma in situ of left breast: Secondary | ICD-10-CM | POA: Diagnosis not present

## 2020-03-18 DIAGNOSIS — Z170 Estrogen receptor positive status [ER+]: Secondary | ICD-10-CM | POA: Diagnosis not present

## 2020-03-18 DIAGNOSIS — Z510 Encounter for antineoplastic radiation therapy: Secondary | ICD-10-CM | POA: Diagnosis not present

## 2020-03-19 ENCOUNTER — Other Ambulatory Visit: Payer: Self-pay

## 2020-03-19 ENCOUNTER — Ambulatory Visit
Admission: RE | Admit: 2020-03-19 | Discharge: 2020-03-19 | Disposition: A | Discharge status: Home / Self Care | Payer: 59 | Source: Ambulatory Visit | Attending: Radiation Oncology | Admitting: Radiation Oncology

## 2020-03-19 DIAGNOSIS — Z170 Estrogen receptor positive status [ER+]: Secondary | ICD-10-CM | POA: Diagnosis not present

## 2020-03-19 DIAGNOSIS — D0512 Intraductal carcinoma in situ of left breast: Secondary | ICD-10-CM | POA: Diagnosis not present

## 2020-03-19 DIAGNOSIS — Z510 Encounter for antineoplastic radiation therapy: Secondary | ICD-10-CM | POA: Diagnosis not present

## 2020-03-20 ENCOUNTER — Other Ambulatory Visit: Payer: Self-pay

## 2020-03-20 ENCOUNTER — Ambulatory Visit
Admission: RE | Admit: 2020-03-20 | Discharge: 2020-03-20 | Disposition: A | Discharge status: Home / Self Care | Payer: 59 | Source: Ambulatory Visit | Attending: Radiation Oncology | Admitting: Radiation Oncology

## 2020-03-20 ENCOUNTER — Inpatient Hospital Stay: Payer: 59 | Attending: Hematology and Oncology

## 2020-03-20 DIAGNOSIS — Z170 Estrogen receptor positive status [ER+]: Secondary | ICD-10-CM | POA: Diagnosis not present

## 2020-03-20 DIAGNOSIS — D0512 Intraductal carcinoma in situ of left breast: Secondary | ICD-10-CM

## 2020-03-20 DIAGNOSIS — Z510 Encounter for antineoplastic radiation therapy: Secondary | ICD-10-CM | POA: Diagnosis not present

## 2020-03-23 ENCOUNTER — Ambulatory Visit
Admission: RE | Admit: 2020-03-23 | Discharge: 2020-03-23 | Disposition: A | Discharge status: Home / Self Care | Payer: 59 | Source: Ambulatory Visit | Attending: Radiation Oncology | Admitting: Radiation Oncology

## 2020-03-23 ENCOUNTER — Other Ambulatory Visit: Payer: Self-pay

## 2020-03-23 DIAGNOSIS — Z170 Estrogen receptor positive status [ER+]: Secondary | ICD-10-CM | POA: Diagnosis not present

## 2020-03-23 DIAGNOSIS — Z510 Encounter for antineoplastic radiation therapy: Secondary | ICD-10-CM | POA: Diagnosis not present

## 2020-03-23 DIAGNOSIS — H5203 Hypermetropia, bilateral: Secondary | ICD-10-CM | POA: Diagnosis not present

## 2020-03-23 DIAGNOSIS — D0512 Intraductal carcinoma in situ of left breast: Secondary | ICD-10-CM | POA: Diagnosis not present

## 2020-03-23 LAB — GENETIC SCREENING ORDER

## 2020-03-24 ENCOUNTER — Other Ambulatory Visit: Payer: Self-pay

## 2020-03-24 ENCOUNTER — Ambulatory Visit
Admission: RE | Admit: 2020-03-24 | Discharge: 2020-03-24 | Disposition: A | Discharge status: Home / Self Care | Payer: 59 | Source: Ambulatory Visit | Attending: Radiation Oncology | Admitting: Radiation Oncology

## 2020-03-24 DIAGNOSIS — Z170 Estrogen receptor positive status [ER+]: Secondary | ICD-10-CM | POA: Diagnosis not present

## 2020-03-24 DIAGNOSIS — D0512 Intraductal carcinoma in situ of left breast: Secondary | ICD-10-CM | POA: Diagnosis not present

## 2020-03-24 DIAGNOSIS — Z510 Encounter for antineoplastic radiation therapy: Secondary | ICD-10-CM | POA: Diagnosis not present

## 2020-03-25 ENCOUNTER — Ambulatory Visit
Admission: RE | Admit: 2020-03-25 | Discharge: 2020-03-25 | Disposition: A | Discharge status: Home / Self Care | Payer: 59 | Source: Ambulatory Visit | Attending: Radiation Oncology | Admitting: Radiation Oncology

## 2020-03-25 ENCOUNTER — Other Ambulatory Visit: Payer: Self-pay

## 2020-03-25 DIAGNOSIS — Z170 Estrogen receptor positive status [ER+]: Secondary | ICD-10-CM | POA: Diagnosis not present

## 2020-03-25 DIAGNOSIS — Z510 Encounter for antineoplastic radiation therapy: Secondary | ICD-10-CM | POA: Diagnosis not present

## 2020-03-25 DIAGNOSIS — D0512 Intraductal carcinoma in situ of left breast: Secondary | ICD-10-CM | POA: Diagnosis not present

## 2020-03-26 ENCOUNTER — Ambulatory Visit
Admission: RE | Admit: 2020-03-26 | Discharge: 2020-03-26 | Disposition: A | Discharge status: Home / Self Care | Payer: 59 | Source: Ambulatory Visit | Attending: Radiation Oncology | Admitting: Radiation Oncology

## 2020-03-26 ENCOUNTER — Other Ambulatory Visit: Payer: Self-pay

## 2020-03-26 DIAGNOSIS — Z170 Estrogen receptor positive status [ER+]: Secondary | ICD-10-CM | POA: Diagnosis not present

## 2020-03-26 DIAGNOSIS — D0512 Intraductal carcinoma in situ of left breast: Secondary | ICD-10-CM | POA: Insufficient documentation

## 2020-03-26 DIAGNOSIS — Z510 Encounter for antineoplastic radiation therapy: Secondary | ICD-10-CM | POA: Insufficient documentation

## 2020-03-27 ENCOUNTER — Ambulatory Visit: Payer: Self-pay | Admitting: Genetic Counselor

## 2020-03-27 ENCOUNTER — Encounter: Payer: Self-pay | Admitting: Genetic Counselor

## 2020-03-27 ENCOUNTER — Ambulatory Visit: Payer: 59 | Admitting: Radiation Oncology

## 2020-03-27 ENCOUNTER — Ambulatory Visit
Admission: RE | Admit: 2020-03-27 | Discharge: 2020-03-27 | Disposition: A | Discharge status: Home / Self Care | Payer: 59 | Source: Ambulatory Visit | Attending: Radiation Oncology | Admitting: Radiation Oncology

## 2020-03-27 ENCOUNTER — Other Ambulatory Visit: Payer: Self-pay

## 2020-03-27 ENCOUNTER — Telehealth: Payer: Self-pay | Admitting: Genetic Counselor

## 2020-03-27 DIAGNOSIS — Z510 Encounter for antineoplastic radiation therapy: Secondary | ICD-10-CM | POA: Diagnosis not present

## 2020-03-27 DIAGNOSIS — D0512 Intraductal carcinoma in situ of left breast: Secondary | ICD-10-CM | POA: Diagnosis not present

## 2020-03-27 DIAGNOSIS — Z170 Estrogen receptor positive status [ER+]: Secondary | ICD-10-CM | POA: Diagnosis not present

## 2020-03-27 DIAGNOSIS — Z1379 Encounter for other screening for genetic and chromosomal anomalies: Secondary | ICD-10-CM

## 2020-03-27 NOTE — Telephone Encounter (Signed)
Revealed negative genetic testing. Discussed that we do not know why she has had breast and thyroid cancer or why there is cancer in the family. There could be a mutation in a different gene that we are not testing, or our current technology may not be able detect certain mutations. It will therefore be important for her to stay in contact with genetics to keep up with whether additional testing may be appropriate in the future.

## 2020-03-27 NOTE — Progress Notes (Signed)
HPI:  Ms. Filippone was previously seen in the [place] clinic due to a personal and family history of cancer and concerns regarding a hereditary predisposition to cancer. Please refer to our prior cancer genetics clinic note for more information regarding our discussion, assessment and recommendations, at the time. Ms. Hannold recent genetic test results were disclosed to her, as were recommendations warranted by these results. These results and recommendations are discussed in more detail below.  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 Diagnostic mammogram showed a 0.5cm group of calcifications in the upper-outer left breast. Biopsy showed high grade DCIS, ER+ 100%, PR+ 90%.    02/03/2020 Surgery   Left lumpectomy: high grade DCIS, 1.2cm, 1 left axillary lymph node negative for carcinoma.   03/27/2020 Genetic Testing   Negative genetic testing:  No pathogenic variants detected on the [facility] Common Hereditary Cancers Panel. The report date is 03/27/2020.  The Common Hereditary Cancers Panel offered by [facility] includes sequencing and/or deletion duplication testing of the following 48 genes: APC, ATM, AXIN2, BARD1, BMPR1A, BRCA1, BRCA2, BRIP1, CDH1, CDK4, CDKN2A (p14ARF), CDKN2A (p16INK4a), CHEK2, CTNNA1, DICER1, EPCAM (Deletion/duplication testing only), GREM1 (promoter region deletion/duplication testing only), KIT, MEN1, MLH1, MSH2, MSH3, MSH6, MUTYH, NBN, NF1, NTHL1, PALB2, PDGFRA, PMS2, POLD1, POLE, PTEN, RAD50, RAD51C, RAD51D, RNF43, SDHB, SDHC, SDHD, SMAD4, SMARCA4. STK11, TP53, TSC1, TSC2, and VHL.  The following genes were evaluated for sequence changes only: SDHA and HOXB13 c.251G>A variant only.     FAMILY HISTORY:  We obtained a detailed, 4-generation family history.  Significant diagnoses are listed below: Family History    Problem Relation Age of Onset  . Heart disease Father        heart attack  . Diabetes Brother   . Stomach cancer Paternal Grandfather 59  . Congestive Heart Failure Paternal Grandmother   . Dementia Maternal Grandmother   . ALS Maternal Grandfather   . Thyroid cancer Other        dx. >50 (maternal grandmother's sister)  . Stroke Maternal Uncle   . Breast cancer Cousin        dx. in her 51s (paternal cousin)  . Hydrocephalus Paternal Uncle        "special needs"  . Colon cancer Maternal Great-grandmother 73       maternal grandmother's mother  . Migraines Neg Hx    Ms. Sann has one daughter (age 51) and one son (age 57). She has one brother (age 15). None of these family members have had cancer.  Ms. Ault mother is 59 and has not had cancer, although she did have a precancerous breast lesion within the last year and is taking tamoxifen. Ms. Martinezgarcia had two maternal uncles, neither of whom have had cancer. Her maternal grandmother died at the age of 43, and her maternal grandfather died at the age of 74 from Railroad. Her grandmother had a sister with thyroid cancer when she was older than 22, and her grandmother's mother died at the age of 34 from colon cancer.   Ms. Dower father died at the age of 70 due to a heart attack after surgery for diverticulitis. She had five paternal uncles and two paternal aunts, none of whom had cancer. One cousin had breast cancer in her 41s. Her paternal grandmother died in her 75s, and her paternal grandfather died at the age of 44 from  stomach cancer.   Ms. Glassner is unaware of previous family history of genetic testing for hereditary cancer risks. Her maternal ancestors are of unknown descent, and paternal ancestors are of [place] descent. There is no reported Ashkenazi Jewish ancestry. There is no known consanguinity.  GENETIC TEST RESULTS: Genetic testing reported out on 03/27/2020 through the [facility] Common Hereditary Cancers panel. No  pathogenic variants were detected.   The Common Hereditary Cancers Panel offered by [facility] includes sequencing and/or deletion duplication testing of the following 48 genes: APC, ATM, AXIN2, BARD1, BMPR1A, BRCA1, BRCA2, BRIP1, CDH1, CDK4, CDKN2A (p14ARF), CDKN2A (p16INK4a), CHEK2, CTNNA1, DICER1, EPCAM (Deletion/duplication testing only), GREM1 (promoter region deletion/duplication testing only), KIT, MEN1, MLH1, MSH2, MSH3, MSH6, MUTYH, NBN, NF1, NTHL1, PALB2, PDGFRA, PMS2, POLD1, POLE, PTEN, RAD50, RAD51C, RAD51D, RNF43, SDHB, SDHC, SDHD, SMAD4, SMARCA4. STK11, TP53, TSC1, TSC2, and VHL.  The following genes were evaluated for sequence changes only: SDHA and HOXB13 c.251G>A variant only. The test report will be scanned into EPIC and located under the Molecular Pathology section of the Results Review tab.  A portion of the result report is included below for reference.     We discussed with Ms. Duma that because current genetic testing is not perfect, it is possible there may be a gene mutation in one of these genes that current testing cannot detect, but that chance is small.  We also discussed that there could be another gene that has not yet been discovered, or that we have not yet tested, that is responsible for the cancer diagnoses in the family. It is also possible there is a hereditary cause for the cancer in the family that Ms. Fong did not inherit and therefore was not identified in her testing.  Therefore, it is important to remain in touch with cancer genetics in the future so that we can continue to offer Ms. Woolford the most up to date genetic testing.   CANCER SCREENING RECOMMENDATIONS: Ms. Erck test result is considered negative (normal).  This means that we have not identified a hereditary cause for her personal and family history of cancer at this time. Most cancers happen by chance and this negative test suggests that her personal and family of cancer may fall into this  category.    While reassuring, this does not definitively rule out a hereditary predisposition to cancer. It is still possible that there could be genetic mutations that are undetectable by current technology. There could be genetic mutations in genes that have not been tested or identified to increase cancer risk.  Therefore, it is recommended she continue to follow the cancer management and screening guidelines provided by her oncology and primary healthcare providers.   An individual's cancer risk and medical management are not determined by genetic test results alone. Overall cancer risk assessment incorporates additional factors, including personal medical history, family history, and any available genetic information that may result in a personalized plan for cancer prevention and surveillance.  RECOMMENDATIONS FOR FAMILY MEMBERS:  Individuals in this family might be at some increased risk of developing cancer, over the general population risk, simply due to the family history of cancer.  We recommended women in this family have a yearly mammogram beginning at age 91, or 22 years younger than the earliest onset of cancer, an annual clinical breast exam, and perform monthly breast self-exams. Women in this family should also have a gynecological exam as recommended by their primary provider. All family members should be referred for colonoscopy starting at  age 46.  FOLLOW-UP: Lastly, we discussed with Ms. Marchesi that cancer genetics is a rapidly advancing field and it is possible that new genetic tests will be appropriate for her and/or her family members in the future. We encouraged her to remain in contact with cancer genetics on an annual basis so we can update her personal and family histories and let her know of advances in cancer genetics that may benefit this family.   Our contact number was provided. Ms. Emert questions were answered to her satisfaction, and she knows she is welcome to  call us at anytime with additional questions or concerns.   Clint Guy, MS, [facility] Genetic Counselor [email or web address] Phone: [phone]

## 2020-03-31 ENCOUNTER — Other Ambulatory Visit: Payer: Self-pay

## 2020-03-31 ENCOUNTER — Ambulatory Visit
Admission: RE | Admit: 2020-03-31 | Discharge: 2020-03-31 | Disposition: A | Discharge status: Home / Self Care | Payer: 59 | Source: Ambulatory Visit | Attending: Radiation Oncology | Admitting: Radiation Oncology

## 2020-03-31 DIAGNOSIS — Z170 Estrogen receptor positive status [ER+]: Secondary | ICD-10-CM | POA: Diagnosis not present

## 2020-03-31 DIAGNOSIS — D0512 Intraductal carcinoma in situ of left breast: Secondary | ICD-10-CM | POA: Diagnosis not present

## 2020-03-31 DIAGNOSIS — Z510 Encounter for antineoplastic radiation therapy: Secondary | ICD-10-CM | POA: Diagnosis not present

## 2020-04-01 ENCOUNTER — Ambulatory Visit: Payer: 59 | Admitting: Hematology and Oncology

## 2020-04-01 ENCOUNTER — Ambulatory Visit
Admission: RE | Admit: 2020-04-01 | Discharge: 2020-04-01 | Disposition: A | Discharge status: Home / Self Care | Payer: 59 | Source: Ambulatory Visit | Attending: Radiation Oncology | Admitting: Radiation Oncology

## 2020-04-01 DIAGNOSIS — D0512 Intraductal carcinoma in situ of left breast: Secondary | ICD-10-CM | POA: Diagnosis not present

## 2020-04-01 DIAGNOSIS — Z170 Estrogen receptor positive status [ER+]: Secondary | ICD-10-CM | POA: Diagnosis not present

## 2020-04-01 DIAGNOSIS — Z510 Encounter for antineoplastic radiation therapy: Secondary | ICD-10-CM | POA: Diagnosis not present

## 2020-04-02 ENCOUNTER — Ambulatory Visit
Admission: RE | Admit: 2020-04-02 | Discharge: 2020-04-02 | Disposition: A | Discharge status: Home / Self Care | Payer: 59 | Source: Ambulatory Visit | Attending: Radiation Oncology | Admitting: Radiation Oncology

## 2020-04-02 DIAGNOSIS — D0512 Intraductal carcinoma in situ of left breast: Secondary | ICD-10-CM | POA: Diagnosis not present

## 2020-04-02 DIAGNOSIS — Z170 Estrogen receptor positive status [ER+]: Secondary | ICD-10-CM | POA: Diagnosis not present

## 2020-04-02 DIAGNOSIS — Z510 Encounter for antineoplastic radiation therapy: Secondary | ICD-10-CM | POA: Diagnosis not present

## 2020-04-03 ENCOUNTER — Ambulatory Visit
Admission: RE | Admit: 2020-04-03 | Discharge: 2020-04-03 | Disposition: A | Discharge status: Home / Self Care | Payer: 59 | Source: Ambulatory Visit | Attending: Radiation Oncology | Admitting: Radiation Oncology

## 2020-04-03 ENCOUNTER — Other Ambulatory Visit: Payer: Self-pay

## 2020-04-03 DIAGNOSIS — Z170 Estrogen receptor positive status [ER+]: Secondary | ICD-10-CM | POA: Diagnosis not present

## 2020-04-03 DIAGNOSIS — D0512 Intraductal carcinoma in situ of left breast: Secondary | ICD-10-CM | POA: Diagnosis not present

## 2020-04-03 DIAGNOSIS — Z510 Encounter for antineoplastic radiation therapy: Secondary | ICD-10-CM | POA: Diagnosis not present

## 2020-04-06 ENCOUNTER — Encounter: Payer: Self-pay | Admitting: Radiation Oncology

## 2020-04-06 ENCOUNTER — Encounter: Payer: Self-pay | Admitting: *Deleted

## 2020-04-06 ENCOUNTER — Ambulatory Visit
Admission: RE | Admit: 2020-04-06 | Discharge: 2020-04-06 | Disposition: A | Discharge status: Home / Self Care | Payer: 59 | Source: Ambulatory Visit | Attending: Radiation Oncology | Admitting: Radiation Oncology

## 2020-04-06 ENCOUNTER — Other Ambulatory Visit: Payer: Self-pay

## 2020-04-06 DIAGNOSIS — D0512 Intraductal carcinoma in situ of left breast: Secondary | ICD-10-CM | POA: Diagnosis not present

## 2020-04-06 DIAGNOSIS — Z833 Family history of diabetes mellitus: Secondary | ICD-10-CM | POA: Diagnosis not present

## 2020-04-06 DIAGNOSIS — M9903 Segmental and somatic dysfunction of lumbar region: Secondary | ICD-10-CM | POA: Diagnosis not present

## 2020-04-06 DIAGNOSIS — M9902 Segmental and somatic dysfunction of thoracic region: Secondary | ICD-10-CM | POA: Diagnosis not present

## 2020-04-06 DIAGNOSIS — M546 Pain in thoracic spine: Secondary | ICD-10-CM | POA: Diagnosis not present

## 2020-04-06 DIAGNOSIS — Z170 Estrogen receptor positive status [ER+]: Secondary | ICD-10-CM | POA: Diagnosis not present

## 2020-04-06 DIAGNOSIS — M545 Low back pain: Secondary | ICD-10-CM | POA: Diagnosis not present

## 2020-04-06 DIAGNOSIS — Z510 Encounter for antineoplastic radiation therapy: Secondary | ICD-10-CM | POA: Diagnosis not present

## 2020-04-06 DIAGNOSIS — M9901 Segmental and somatic dysfunction of cervical region: Secondary | ICD-10-CM | POA: Diagnosis not present

## 2020-04-06 DIAGNOSIS — E890 Postprocedural hypothyroidism: Secondary | ICD-10-CM | POA: Diagnosis not present

## 2020-04-06 DIAGNOSIS — M9905 Segmental and somatic dysfunction of pelvic region: Secondary | ICD-10-CM | POA: Diagnosis not present

## 2020-04-06 DIAGNOSIS — M9904 Segmental and somatic dysfunction of sacral region: Secondary | ICD-10-CM | POA: Diagnosis not present

## 2020-04-06 DIAGNOSIS — M542 Cervicalgia: Secondary | ICD-10-CM | POA: Diagnosis not present

## 2020-04-06 DIAGNOSIS — C73 Malignant neoplasm of thyroid gland: Secondary | ICD-10-CM | POA: Diagnosis not present

## 2020-04-09 NOTE — Progress Notes (Signed)
Patient Care Team: Isaac Bliss, Rayford Halsted, MD as PCP - General (Internal Medicine) Rockwell Germany, RN as Oncology Nurse Navigator Mauro Kaufmann, RN as Oncology Nurse Navigator  DIAGNOSIS:    ICD-10-CM   1. 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 Diagnostic mammogram showed a 0.5cm group of calcifications in the upper-outer left breast. Biopsy showed high grade DCIS, ER+ 100%, PR+ 90%.    02/03/2020 Surgery   Left lumpectomy: high grade DCIS, 1.2cm, 1 left axillary lymph node negative for carcinoma.   03/10/2020 - 04/06/2020 Radiation Therapy   Adjuvant radiation   03/27/2020 Genetic Testing   Negative genetic testing:  No pathogenic variants detected on the [facility] Common Hereditary Cancers Panel. The report date is 03/27/2020.  The Common Hereditary Cancers Panel offered by [facility] includes sequencing and/or deletion duplication testing of the following 48 genes: APC, ATM, AXIN2, BARD1, BMPR1A, BRCA1, BRCA2, BRIP1, CDH1, CDK4, CDKN2A (p14ARF), CDKN2A (p16INK4a), CHEK2, CTNNA1, DICER1, EPCAM (Deletion/duplication testing only), GREM1 (promoter region deletion/duplication testing only), KIT, MEN1, MLH1, MSH2, MSH3, MSH6, MUTYH, NBN, NF1, NTHL1, PALB2, PDGFRA, PMS2, POLD1, POLE, PTEN, RAD50, RAD51C, RAD51D, RNF43, SDHB, SDHC, SDHD, SMAD4, SMARCA4. STK11, TP53, TSC1, TSC2, and VHL.  The following genes were evaluated for sequence changes only: SDHA and HOXB13 c.251G>A variant only.     CHIEF COMPLIANT: Follow-up to discuss antiestrogen therapy  INTERVAL HISTORY: Gwendolyn Flores is a 54 y.o. with above-mentioned history of left breast DCIS who underwent a left lumpectomy and completed radiation on 04/06/20. She presents to the clinic today to discuss  antiestrogen therapy.   She is recovering very well from the effects of radiation.  ALLERGIES:  is allergic to codeine, levaquin [levofloxacin in d5w], and sulfa antibiotics.  MEDICATIONS:  Current Outpatient Medications  Medication Sig Dispense Refill  . Ascorbic Acid (VITAMIN C) 1000 MG tablet Take 1,000 mg by mouth daily.    Marland Kitchen aspirin-acetaminophen-caffeine (EXCEDRIN MIGRAINE) 250-250-65 MG tablet Take 1 tablet by mouth every 6 (six) hours as needed for headache.     . Calcium-Magnesium-Vitamin D (CALCIUM MAGNESIUM PO) Take 3 tablets by mouth daily.    . clobetasol cream (TEMOVATE) 0.05 % Use as directed as needed    . Diclofenac Potassium,Migraine, 50 MG PACK Take 50 mg by mouth once as needed. For migraine. (Patient taking differently: Take 50 mg by mouth daily as needed (migraine). ) 9 each 11  . Galcanezumab-gnlm (EMGALITY) 120 MG/ML SOAJ Inject 120 mg into the skin every 30 (thirty) days. 1 pen 11  . ibuprofen (ADVIL,MOTRIN) 200 MG tablet Take 400 mg by mouth every 6 (six) hours as needed for moderate pain.     Marland Kitchen levothyroxine (SYNTHROID) 100 MCG tablet Take 100 mcg by mouth daily before breakfast.     . Magnesium Gluconate 550 MG TABS Take 1 tablet twice a day    . naproxen (NAPROSYN) 500 MG tablet Take with Imitrex for migraine or vertigo. (Patient taking differently: Take 500 mg by mouth daily as needed (migraine or vertigo). ) 60 tablet 12  . ondansetron (ZOFRAN) 4 MG tablet Take 1 tablet (4 mg total) by mouth every 8 (eight) hours as needed for nausea or vomiting. 30 tablet 1  . Rimegepant Sulfate (NURTEC) 75 MG TBDP Take 75 mg by mouth daily  as needed. For migraines. Take as close to onset of migraine as possible. One daily maximum. 10 tablet 6  . SUMAtriptan (IMITREX) 100 MG tablet TAKE 1 TABLET BY MOUTH ONCE AS NEEDED. MAY REPEAT IN 2 HOURS IF HEADACHE PERSISTS OR RECURS. MAX 2 TAB/DAY. NEED YEARLY FOLLOW-UP. (Patient taking differently: Take 100 mg by mouth every 2 (two) hours as  needed for migraine. ) 10 tablet 0  . VITAMIN D PO Take 1 tablet by mouth daily.      No current facility-administered medications for this visit.    PHYSICAL EXAMINATION: ECOG PERFORMANCE STATUS: 1 - Symptomatic but completely ambulatory  Vitals:   04/10/20 0813  BP: 108/77  Pulse: 68  Resp: 18  Temp: 98.5 F (36.9 C)  SpO2: 97%   Filed Weights   04/10/20 0813  Weight: 143 lb 12.8 oz (65.2 kg)     LABORATORY DATA:  I have reviewed the data as listed CMP Latest Ref Rng & Units 01/13/2020  Glucose 70 - 99 mg/dL 71  BUN 6 - 23 mg/dL 13  Creatinine 0.40 - 1.20 mg/dL 0.85  Sodium 135 - 145 mEq/L 139  Potassium 3.5 - 5.1 mEq/L 3.9  Chloride 96 - 112 mEq/L 104  CO2 19 - 32 mEq/L 28  Calcium 8.4 - 10.5 mg/dL 9.4  Total Protein 6.0 - 8.3 g/dL 6.5  Total Bilirubin 0.2 - 1.2 mg/dL 0.5  Alkaline Phos 39 - 117 U/L 62  AST 0 - 37 U/L 18  ALT 0 - 35 U/L 18    Lab Results  Component Value Date   WBC 4.6 01/13/2020   HGB 14.0 01/13/2020   HCT 41.0 01/13/2020   MCV 95.9 01/13/2020   PLT 231.0 01/13/2020   NEUTROABS 2.1 01/13/2020    ASSESSMENT & PLAN:  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 Diagnostic mammogram showed a 0.5cm group of calcifications in the upper-outer left breast. Biopsy showed high grade DCIS, ER+ 100%, PR+ 90%.  02/03/2020: Left lumpectomy: High-grade DCIS 1.2 cm, 1 axillary lymph node negative, margins negative  Adjuvant radiation 03/10/2020-04/06/2020  Treatment plan: Adjuvant antiestrogen therapy with anastrozole 1 mg daily x5 years Because of her history of coronary artery disease we decided to use anastrozole over tamoxifen which has thromboembolic disease as a side effect. After reviewing the risks and benefits of the antiestrogen therapy, she is willing to consider taking half a tablet daily to see if she tolerates it.  However would like to obtain a bone  density test as baseline to make the final decision. I will arrange for a bone density test to be done at the breast center in the next couple of weeks.  Patient's husband Dr. Mark Cacciola is our surgeon at [facility]. Baseline bone density test will need to be performed. We counseled her about antiestrogen therapy compliance study. She [place] to [place] beach.  Return to clinic in 3 months for survivorship care plan visit   No orders of the defined types were placed in this encounter.  The patient has a good understanding of the overall plan. she agrees with it. she will call with any problems that may develop before the next visit here.  Total time spent: 30 mins including face to face time and time spent for planning, charting and coordination of care  Gudena, Vinay, MD 04/10/2020  I, Molly Dorshimer, am acting as   scribe for Dr. Vinay Gudena.  I have reviewed the above documentation for accuracy and completeness, and I agree with the above.       

## 2020-04-10 ENCOUNTER — Other Ambulatory Visit: Payer: Self-pay

## 2020-04-10 ENCOUNTER — Inpatient Hospital Stay: Payer: 59 | Attending: Hematology and Oncology | Admitting: Hematology and Oncology

## 2020-04-10 DIAGNOSIS — D0512 Intraductal carcinoma in situ of left breast: Secondary | ICD-10-CM | POA: Diagnosis not present

## 2020-04-10 DIAGNOSIS — Z170 Estrogen receptor positive status [ER+]: Secondary | ICD-10-CM | POA: Insufficient documentation

## 2020-04-10 DIAGNOSIS — Z79811 Long term (current) use of aromatase inhibitors: Secondary | ICD-10-CM | POA: Insufficient documentation

## 2020-04-10 DIAGNOSIS — Z923 Personal history of irradiation: Secondary | ICD-10-CM | POA: Insufficient documentation

## 2020-04-10 NOTE — Assessment & Plan Note (Signed)
01/29/2020:Screening mammogram showed left breast calcifications. Diagnostic mammogram showed a 0.5cm group of calcifications in the upper-outer left breast. Biopsy showed high grade DCIS, ER+ 100%, PR+ 90%.  02/03/2020: Left lumpectomy: High-grade DCIS 1.2 cm, 1 axillary lymph node negative, margins negative  Adjuvant radiation 03/10/2020-04/06/2020  Treatment plan: Adjuvant antiestrogen therapy with anastrozole 1 mg daily x5 years Because of her history of coronary artery disease we decided to use anastrozole over tamoxifen which has thromboembolic disease as a side effect.  Patient's husband Dr. Aviva Signs is our surgeon at [facility]. Baseline bone density test will need to be performed. We counseled her about antiestrogen therapy compliance study. Return to clinic in 3 months for survivorship care plan visit

## 2020-04-13 ENCOUNTER — Telehealth: Payer: Self-pay | Admitting: Hematology and Oncology

## 2020-04-13 NOTE — Telephone Encounter (Signed)
Scheduled per 7/16 los. Called and spoke with pt, confirmed 10/22 appt

## 2020-04-16 MED FILL — EMGALITY 120 MG/ML SOAJ: 120 | 30 days supply | Qty: 1 | Fill #5

## 2020-05-01 NOTE — Progress Notes (Signed)
  Radiation Oncology         (336) [phone] ________________________________  Name: Gwendolyn Flores MRN: [id number]  Date: 04/06/2020  DOB: 03-Sep-1966  End of Treatment Note  Diagnosis:   left-sided breast cancer     Indication for treatment:  Curative       Radiation treatment dates:   03/09/20 - 04/06/20  Site/dose:   The patient initially received a dose of 42.56 Gy in 16 fractions to the breast using whole-breast tangent fields. This was delivered using a 3-D conformal technique. The patient then received a boost to the seroma. This delivered an additional 8 Gy in 31fractions using a 3 field photon technique due to the depth of the seroma. The total dose was 50.56 Gy.  Narrative: The patient tolerated radiation treatment relatively well.   The patient had some expected skin irritation as she progressed during treatment. Moist desquamation was not present at the end of treatment.  Plan: The patient has completed radiation treatment. The patient will return to radiation oncology clinic for routine followup in one month. I advised the patient to call or return sooner if they have any questions or concerns related to their recovery or treatment. ________________________________  Jodelle Gross, M.D., Ph.D.

## 2020-05-04 ENCOUNTER — Telehealth: Payer: Self-pay | Admitting: Radiation Oncology

## 2020-05-04 ENCOUNTER — Other Ambulatory Visit: Payer: Self-pay | Admitting: *Deleted

## 2020-05-04 DIAGNOSIS — D0512 Intraductal carcinoma in situ of left breast: Secondary | ICD-10-CM

## 2020-05-04 NOTE — Telephone Encounter (Signed)
  Radiation Oncology         (336) [phone] ________________________________  Name: Gwendolyn Flores MRN: [id number]  Date of Service: 05/12/20   DOB: 10-Oct-1965  Post Treatment Telephone Note  Diagnosis:   ER/PR high-grade DCIS of the left breast  Interval Since Last Radiation:  5 weeks   03/09/20 - 04/06/20 The patient initially received a dose of 42.56 Gy in 16 fractions to the breast using whole-breast tangent fields. This was delivered using a 3-D conformal technique. The patient then received a boost to the seroma. This delivered an additional 8 Gy in 14fractions using a 3 field photon technique due to the depth of the seroma. The total dose was 50.56 Gy.  Narrative:  The patient was contacted today for routine follow-up. During treatment she did very well with radiotherapy and did not have significant desquamation.    Impression/Plan: 1. ER/PR high-grade DCIS of the left breast. I was unable to reach the patient today so I left a message. On the message I discussed that we would be happy to continue to follow her as needed, but she will also continue to follow up with Dr. [name] in medical oncology. She was counseled on skin care as well as measures to avoid sun exposure to this area and encouraged to call with questions so we could discuss further.     Carola Rhine, PAC

## 2020-05-08 DIAGNOSIS — M545 Low back pain: Secondary | ICD-10-CM | POA: Diagnosis not present

## 2020-05-08 DIAGNOSIS — M546 Pain in thoracic spine: Secondary | ICD-10-CM | POA: Diagnosis not present

## 2020-05-08 DIAGNOSIS — M9901 Segmental and somatic dysfunction of cervical region: Secondary | ICD-10-CM | POA: Diagnosis not present

## 2020-05-08 DIAGNOSIS — M9903 Segmental and somatic dysfunction of lumbar region: Secondary | ICD-10-CM | POA: Diagnosis not present

## 2020-05-08 DIAGNOSIS — M9905 Segmental and somatic dysfunction of pelvic region: Secondary | ICD-10-CM | POA: Diagnosis not present

## 2020-05-08 DIAGNOSIS — M9902 Segmental and somatic dysfunction of thoracic region: Secondary | ICD-10-CM | POA: Diagnosis not present

## 2020-05-08 DIAGNOSIS — M542 Cervicalgia: Secondary | ICD-10-CM | POA: Diagnosis not present

## 2020-05-08 DIAGNOSIS — M9904 Segmental and somatic dysfunction of sacral region: Secondary | ICD-10-CM | POA: Diagnosis not present

## 2020-05-11 MED FILL — EMGALITY 120 MG/ML SOAJ: 120 | 30 days supply | Qty: 1 | Fill #6

## 2020-05-11 MED FILL — SYNTHROID 100 MCG TABLET: 100 | 90 days supply | Qty: 84 | Fill #1

## 2020-05-12 ENCOUNTER — Ambulatory Visit: Payer: 59 | Admitting: Neurology

## 2020-05-18 ENCOUNTER — Ambulatory Visit (HOSPITAL_COMMUNITY)
Admission: RE | Admit: 2020-05-18 | Discharge: 2020-05-18 | Disposition: A | Discharge status: Home / Self Care | Payer: 59 | Source: Ambulatory Visit | Attending: Hematology and Oncology | Admitting: Hematology and Oncology

## 2020-05-18 ENCOUNTER — Other Ambulatory Visit: Payer: Self-pay

## 2020-05-18 DIAGNOSIS — Z780 Asymptomatic menopausal state: Secondary | ICD-10-CM | POA: Diagnosis not present

## 2020-05-18 DIAGNOSIS — D0512 Intraductal carcinoma in situ of left breast: Secondary | ICD-10-CM | POA: Diagnosis not present

## 2020-05-18 DIAGNOSIS — M8589 Other specified disorders of bone density and structure, multiple sites: Secondary | ICD-10-CM | POA: Diagnosis not present

## 2020-05-18 DIAGNOSIS — Z853 Personal history of malignant neoplasm of breast: Secondary | ICD-10-CM | POA: Diagnosis not present

## 2020-05-19 ENCOUNTER — Telehealth: Payer: Self-pay | Admitting: Radiation Oncology

## 2020-05-19 NOTE — Telephone Encounter (Signed)
The patient called me back and we discussed skin care considerations following radiotherapy. She is also awaiting a call back about her calcium supplement as it contains soy. I encouraged her to discuss this further with Dr. [name]. Otherwise we will follow up with her as needed and encouraged her to call if she has questions or concerns about her prior therapy.

## 2020-05-25 DIAGNOSIS — M9905 Segmental and somatic dysfunction of pelvic region: Secondary | ICD-10-CM | POA: Diagnosis not present

## 2020-05-25 DIAGNOSIS — M9901 Segmental and somatic dysfunction of cervical region: Secondary | ICD-10-CM | POA: Diagnosis not present

## 2020-05-25 DIAGNOSIS — M9903 Segmental and somatic dysfunction of lumbar region: Secondary | ICD-10-CM | POA: Diagnosis not present

## 2020-05-25 DIAGNOSIS — M546 Pain in thoracic spine: Secondary | ICD-10-CM | POA: Diagnosis not present

## 2020-05-25 DIAGNOSIS — M542 Cervicalgia: Secondary | ICD-10-CM | POA: Diagnosis not present

## 2020-05-25 DIAGNOSIS — M545 Low back pain: Secondary | ICD-10-CM | POA: Diagnosis not present

## 2020-05-25 DIAGNOSIS — M9904 Segmental and somatic dysfunction of sacral region: Secondary | ICD-10-CM | POA: Diagnosis not present

## 2020-05-25 DIAGNOSIS — M9902 Segmental and somatic dysfunction of thoracic region: Secondary | ICD-10-CM | POA: Diagnosis not present

## 2020-06-05 ENCOUNTER — Other Ambulatory Visit: Payer: 59

## 2020-06-15 DIAGNOSIS — Z1283 Encounter for screening for malignant neoplasm of skin: Secondary | ICD-10-CM | POA: Diagnosis not present

## 2020-06-15 DIAGNOSIS — D225 Melanocytic nevi of trunk: Secondary | ICD-10-CM | POA: Diagnosis not present

## 2020-06-22 DIAGNOSIS — M545 Low back pain: Secondary | ICD-10-CM | POA: Diagnosis not present

## 2020-06-22 DIAGNOSIS — M546 Pain in thoracic spine: Secondary | ICD-10-CM | POA: Diagnosis not present

## 2020-06-22 DIAGNOSIS — M9905 Segmental and somatic dysfunction of pelvic region: Secondary | ICD-10-CM | POA: Diagnosis not present

## 2020-06-22 DIAGNOSIS — M9901 Segmental and somatic dysfunction of cervical region: Secondary | ICD-10-CM | POA: Diagnosis not present

## 2020-06-22 DIAGNOSIS — M9902 Segmental and somatic dysfunction of thoracic region: Secondary | ICD-10-CM | POA: Diagnosis not present

## 2020-06-22 DIAGNOSIS — M9904 Segmental and somatic dysfunction of sacral region: Secondary | ICD-10-CM | POA: Diagnosis not present

## 2020-06-22 DIAGNOSIS — M542 Cervicalgia: Secondary | ICD-10-CM | POA: Diagnosis not present

## 2020-06-22 DIAGNOSIS — M9903 Segmental and somatic dysfunction of lumbar region: Secondary | ICD-10-CM | POA: Diagnosis not present

## 2020-06-22 MED FILL — EMGALITY 120 MG/ML SOAJ: 120 | 30 days supply | Qty: 1 | Fill #7

## 2020-06-30 ENCOUNTER — Encounter: Payer: Self-pay | Admitting: Internal Medicine

## 2020-07-01 ENCOUNTER — Other Ambulatory Visit: Payer: Self-pay | Admitting: Internal Medicine

## 2020-07-01 MED ORDER — SUMATRIPTAN SUCCINATE 100 MG PO TABS: 100.0000 mg | ORAL_TABLET | ORAL | 2 refills | Status: DC | PRN

## 2020-07-01 MED FILL — SUMAtriptan SUCCINATE 100 M: 100 | 30 days supply | Qty: 9 | Fill #0

## 2020-07-07 ENCOUNTER — Ambulatory Visit (INDEPENDENT_AMBULATORY_CARE_PROVIDER_SITE_OTHER): Payer: 59 | Admitting: Neurology

## 2020-07-07 DIAGNOSIS — G43709 Chronic migraine without aura, not intractable, without status migrainosus: Secondary | ICD-10-CM

## 2020-07-07 NOTE — Progress Notes (Signed)
Botox- 200 units x 1 vial Lot: [id number] Expiration: 12/2022 NDC: 0023-3921-02  Bacteriostatic 0.9% Sodium Chloride- 4mL total Lot: [id number] Expiration: 06/26/2021 NDC: 0409-1966-02  Dx: G43.709 B/B  

## 2020-07-07 NOTE — Progress Notes (Signed)
Interval history 07/07/2020: Botox extremely beneficial. She was having 15 headache days a month with 8 severe migraines but since we started botox she is extremely better still > 90% improvement. Imitrex works well. Emgality has really helped. +20U each masseter, at the hairline as well, no procerus or corrugators or frontalis for this patient, minimal in the cervical muscles.    Interval history 02/05/2020: Botox extremely beneficial. She was having 15 headache days a month with 8 severe migraines but since we started botox she is extremely better > 90% improvement. Imitrex works well. Emgality has really helped. She is doing well right now. Trokendi titrated up to 50mg  and helped but had hair loss. Could try Qudexy, or zonegran. She did not see a difference with the Ajovy vs emgality vs aimovig,  The remaining headaches are more tension tyoe, tensionin the temporal lobes always sometimes in the back. Dry needling helped but she sees a Restaurant manager, fast food.  Lightly in the cervical spinal muscles last time she felt weak. She thinks she has fibromyalgia, we will perform a rheumatologic evaluation due to her joint pain and FHx of autoimmune disorders. She has pain in the bilateral SCMs behind the ears, very proximally  Consent Form Botulism Toxin Injection For Chronic Migraine    Reviewed orally with patient, additionally signature is on file:  Botulism toxin has been approved by the Federal drug administration for treatment of chronic migraine. Botulism toxin does not cure chronic migraine and it may not be effective in some patients.  The administration of botulism toxin is accomplished by injecting a small amount of toxin into the muscles of the neck and head. Dosage must be titrated for each individual. Any benefits resulting from botulism toxin tend to wear off after 3 months with a repeat injection required if benefit is to be maintained. Injections are usually done every 3-4 months with maximum effect  peak achieved by about 2 or 3 weeks. Botulism toxin is expensive and you should be sure of what costs you will incur resulting from the injection.  The side effects of botulism toxin use for chronic migraine may include:   -Transient, and usually mild, facial weakness with facial injections  -Transient, and usually mild, head or neck weakness with head/neck injections  -Reduction or loss of forehead facial animation due to forehead muscle weakness  -Eyelid drooping  -Dry eye  -Pain at the site of injection or bruising at the site of injection  -Double vision  -Potential unknown long term risks  Contraindications: You should not have Botox if you are pregnant, nursing, allergic to albumin, have an infection, skin condition, or muscle weakness at the site of the injection, or have myasthenia gravis, Lambert-Eaton syndrome, or ALS.  It is also possible that as with any injection, there may be an allergic reaction or no effect from the medication. Reduced effectiveness after repeated injections is sometimes seen and rarely infection at the injection site may occur. All care will be taken to prevent these side effects. If therapy is given over a long time, atrophy and wasting in the muscle injected may occur. Occasionally the patient's become refractory to treatment because they develop antibodies to the toxin. In this event, therapy needs to be modified.  I have read the above information and consent to the administration of botulism toxin.    BOTOX PROCEDURE NOTE FOR MIGRAINE HEADACHE    Contraindications and precautions discussed with patient(above). Aseptic procedure was observed and patient tolerated procedure. Procedure performed by Dr. Georgia Dom  The condition has existed for more than 6 months, and pt does not have a diagnosis of ALS, Myasthenia Gravis or Lambert-Eaton Syndrome.  Risks and benefits of injections discussed and pt agrees to proceed with the procedure.  Written consent  obtained  These injections are medically necessary. Pt  receives good benefits from these injections. These injections do not cause sedations or hallucinations which the oral therapies may cause.  Description of procedure:  The patient was placed in a sitting position. The standard protocol was used for Botox as follows, with 5 units of Botox injected at each site:   -Procerus muscle, midline injection  -Corrugator muscle, bilateral injection  -Frontalis muscle, bilateral injection, with 2 sites each side, medial injection was performed in the upper one third of the frontalis muscle, in the region vertical from the medial inferior edge of the superior orbital rim. The lateral injection was again in the upper one third of the forehead vertically above the lateral limbus of the cornea, 1.5 cm lateral to the medial injection site.  -Temporalis muscle injection, 4 sites, bilaterally. The first injection was 3 cm above the tragus of the ear, second injection site was 1.5 cm to 3 cm up from the first injection site in line with the tragus of the ear. The third injection site was 1.5-3 cm forward between the first 2 injection sites. The fourth injection site was 1.5 cm posterior to the second injection site.   -Occipitalis muscle injection, 3 sites, bilaterally. The first injection was done one half way between the occipital protuberance and the tip of the mastoid process behind the ear. The second injection site was done lateral and superior to the first, 1 fingerbreadth from the first injection. The third injection site was 1 fingerbreadth superiorly and medially from the first injection site.  -Cervical paraspinal muscle injection, 2 sites, bilateral knee first injection site was 1 cm from the midline of the cervical spine, 3 cm inferior to the lower border of the occipital protuberance. The second injection site was 1.5 cm superiorly and laterally to the first injection site.  -Trapezius muscle  injection was performed at 3 sites, bilaterally. The first injection site was in the upper trapezius muscle halfway between the inflection point of the neck, and the acromion. The second injection site was one half way between the acromion and the first injection site. The third injection was done between the first injection site and the inflection point of the neck.   Will return for repeat injection in 3 months.   200 units of Botox was used, any Botox not injected was wasted. The patient tolerated the procedure well, there were no complications of the above procedure.

## 2020-07-16 ENCOUNTER — Telehealth: Payer: Self-pay | Admitting: Adult Health

## 2020-07-16 NOTE — Telephone Encounter (Signed)
Rescheduled per provider. Called pt and left a msg

## 2020-07-17 ENCOUNTER — Telehealth: Payer: Self-pay | Admitting: Adult Health

## 2020-07-17 ENCOUNTER — Inpatient Hospital Stay: Payer: 59 | Admitting: Adult Health

## 2020-07-17 NOTE — Telephone Encounter (Signed)
Rescheduled appointment per 10/22 sch msg. Spoke to patient who needed to talk to nurse about medical information. Transferred call to Dr. Geralyn Flash nurse station.

## 2020-07-20 DIAGNOSIS — M9903 Segmental and somatic dysfunction of lumbar region: Secondary | ICD-10-CM | POA: Diagnosis not present

## 2020-07-20 DIAGNOSIS — M9901 Segmental and somatic dysfunction of cervical region: Secondary | ICD-10-CM | POA: Diagnosis not present

## 2020-07-20 DIAGNOSIS — M542 Cervicalgia: Secondary | ICD-10-CM | POA: Diagnosis not present

## 2020-07-20 DIAGNOSIS — M9905 Segmental and somatic dysfunction of pelvic region: Secondary | ICD-10-CM | POA: Diagnosis not present

## 2020-07-20 DIAGNOSIS — M9902 Segmental and somatic dysfunction of thoracic region: Secondary | ICD-10-CM | POA: Diagnosis not present

## 2020-07-20 DIAGNOSIS — M545 Low back pain, unspecified: Secondary | ICD-10-CM | POA: Diagnosis not present

## 2020-07-20 DIAGNOSIS — M9904 Segmental and somatic dysfunction of sacral region: Secondary | ICD-10-CM | POA: Diagnosis not present

## 2020-07-20 DIAGNOSIS — M546 Pain in thoracic spine: Secondary | ICD-10-CM | POA: Diagnosis not present

## 2020-08-07 DIAGNOSIS — E890 Postprocedural hypothyroidism: Secondary | ICD-10-CM | POA: Diagnosis not present

## 2020-08-07 DIAGNOSIS — C73 Malignant neoplasm of thyroid gland: Secondary | ICD-10-CM | POA: Diagnosis not present

## 2020-08-07 DIAGNOSIS — Z833 Family history of diabetes mellitus: Secondary | ICD-10-CM | POA: Diagnosis not present

## 2020-08-14 ENCOUNTER — Other Ambulatory Visit: Payer: Self-pay

## 2020-08-14 ENCOUNTER — Ambulatory Visit (INDEPENDENT_AMBULATORY_CARE_PROVIDER_SITE_OTHER): Payer: 59 | Admitting: Adult Health

## 2020-08-14 ENCOUNTER — Encounter: Payer: Self-pay | Admitting: Adult Health

## 2020-08-14 VITALS — BP 95/64 | HR 75 | Ht 65.0 in | Wt 140.8 lb

## 2020-08-14 DIAGNOSIS — N816 Rectocele: Secondary | ICD-10-CM | POA: Diagnosis not present

## 2020-08-14 DIAGNOSIS — E559 Vitamin D deficiency, unspecified: Secondary | ICD-10-CM | POA: Diagnosis not present

## 2020-08-14 DIAGNOSIS — M85852 Other specified disorders of bone density and structure, left thigh: Secondary | ICD-10-CM | POA: Diagnosis not present

## 2020-08-14 DIAGNOSIS — Z85850 Personal history of malignant neoplasm of thyroid: Secondary | ICD-10-CM | POA: Diagnosis not present

## 2020-08-14 DIAGNOSIS — Z01419 Encounter for gynecological examination (general) (routine) without abnormal findings: Secondary | ICD-10-CM | POA: Diagnosis not present

## 2020-08-14 DIAGNOSIS — Z853 Personal history of malignant neoplasm of breast: Secondary | ICD-10-CM | POA: Diagnosis not present

## 2020-08-14 DIAGNOSIS — Z833 Family history of diabetes mellitus: Secondary | ICD-10-CM | POA: Diagnosis not present

## 2020-08-14 DIAGNOSIS — Z1211 Encounter for screening for malignant neoplasm of colon: Secondary | ICD-10-CM | POA: Diagnosis not present

## 2020-08-14 DIAGNOSIS — E890 Postprocedural hypothyroidism: Secondary | ICD-10-CM | POA: Diagnosis not present

## 2020-08-14 LAB — HEMOCCULT GUIAC POC 1CARD (OFFICE): Fecal Occult Blood, POC: NEGATIVE

## 2020-08-14 MED FILL — SYNTHROID 100 MCG TABLET: 100 | 90 days supply | Qty: 84 | Fill #2

## 2020-08-14 NOTE — Progress Notes (Signed)
Patient ID: Gwendolyn Flores, female   DOB: Jun 03, 1966, 54 y.o.   MRN: [id number] History of Present Illness:    Current Medications, Allergies, Past Medical History, Past Surgical History, Family History and Social History were reviewed in [employer] record.     Review of Systems:     Physical Exam:BP 95/64 (BP Location: Right Arm, Patient Position: Sitting, Cuff Size: Normal)   Pulse 75   Ht 5\' 5"  (1.651 m)   Wt 140 lb 12.8 oz (63.9 kg)   LMP 08/12/2016 Comment: GYN - Learned  BMI 23.43 kg/m  General:  Well developed, well nourished, no acute distress Skin:  Warm and dry Neck:  Midline trachea, thyroid surgically absent , good ROM, no lymphadenopathy Lungs; Clear to auscultation bilaterally Breast:  No dominant palpable mass, retraction, or nipple discharge, has thickness left breast where had lumpectomy, and radiation. Cardiovascular: Regular rate and rhythm Abdomen:  Soft, non tender, no hepatosplenomegaly Pelvic:  External genitalia is normal in appearance, no lesions.  The vagina is normal in appearance. Urethra has no lesions or masses. The cervix is bulbous.  Uterus is felt to be normal size, shape, and contour.  No adnexal masses or tenderness noted.Bladder is non tender, no masses felt. Rectal: Good sphincter tone, no polyps, +hemorrhoids felt.  Hemoccult negative.+rectocele  Extremities/musculoskeletal:  No swelling or varicosities noted, no clubbing or cyanosis Psych:  No mood changes, alert and cooperative,seems happy AA is 1 Fall risk is low PHQ 9 score is 4, no SI    Upstream - 08/14/20 1256      Pregnancy Intention Screening   Does the patient want to become pregnant in the next year? No    Does the patient's partner want to become pregnant in the next year? No    Would the patient like to discuss contraceptive options today? No      Contraception Wrap Up   Current Method Vasectomy    End Method Vasectomy    Contraception Counseling  Provided No         Examination chaperoned by Levy Pupa LPN  Impression and Plan: 1. Encounter for well woman exam with routine gynecological exam Pap and physical in 1 year Mammogram yearly Colonoscopy per [facility] with PCP   2. Encounter for screening fecal occult blood testing  3. Rectocele  4. History of thyroid cancer  5. History of breast cancer

## 2020-08-17 DIAGNOSIS — M542 Cervicalgia: Secondary | ICD-10-CM | POA: Diagnosis not present

## 2020-08-17 DIAGNOSIS — M9903 Segmental and somatic dysfunction of lumbar region: Secondary | ICD-10-CM | POA: Diagnosis not present

## 2020-08-17 DIAGNOSIS — M9901 Segmental and somatic dysfunction of cervical region: Secondary | ICD-10-CM | POA: Diagnosis not present

## 2020-08-17 DIAGNOSIS — M9904 Segmental and somatic dysfunction of sacral region: Secondary | ICD-10-CM | POA: Diagnosis not present

## 2020-08-17 DIAGNOSIS — M545 Low back pain, unspecified: Secondary | ICD-10-CM | POA: Diagnosis not present

## 2020-08-17 DIAGNOSIS — M9902 Segmental and somatic dysfunction of thoracic region: Secondary | ICD-10-CM | POA: Diagnosis not present

## 2020-08-17 DIAGNOSIS — M546 Pain in thoracic spine: Secondary | ICD-10-CM | POA: Diagnosis not present

## 2020-08-17 DIAGNOSIS — M9905 Segmental and somatic dysfunction of pelvic region: Secondary | ICD-10-CM | POA: Diagnosis not present

## 2020-08-18 ENCOUNTER — Other Ambulatory Visit (HOSPITAL_COMMUNITY): Payer: Self-pay | Admitting: Dermatology

## 2020-08-18 MED FILL — CLOBETASOL PROPIONATE 0.05: 0.05 | 15 days supply | Qty: 60 | Fill #0

## 2020-08-21 ENCOUNTER — Encounter: Payer: 59 | Admitting: Adult Health

## 2020-09-11 DIAGNOSIS — M9905 Segmental and somatic dysfunction of pelvic region: Secondary | ICD-10-CM | POA: Diagnosis not present

## 2020-09-11 DIAGNOSIS — M545 Low back pain, unspecified: Secondary | ICD-10-CM | POA: Diagnosis not present

## 2020-09-11 DIAGNOSIS — M9903 Segmental and somatic dysfunction of lumbar region: Secondary | ICD-10-CM | POA: Diagnosis not present

## 2020-09-11 DIAGNOSIS — M546 Pain in thoracic spine: Secondary | ICD-10-CM | POA: Diagnosis not present

## 2020-09-11 DIAGNOSIS — M9904 Segmental and somatic dysfunction of sacral region: Secondary | ICD-10-CM | POA: Diagnosis not present

## 2020-09-11 DIAGNOSIS — M9901 Segmental and somatic dysfunction of cervical region: Secondary | ICD-10-CM | POA: Diagnosis not present

## 2020-09-11 DIAGNOSIS — M542 Cervicalgia: Secondary | ICD-10-CM | POA: Diagnosis not present

## 2020-09-11 DIAGNOSIS — M9902 Segmental and somatic dysfunction of thoracic region: Secondary | ICD-10-CM | POA: Diagnosis not present

## 2020-10-09 DIAGNOSIS — M9903 Segmental and somatic dysfunction of lumbar region: Secondary | ICD-10-CM | POA: Diagnosis not present

## 2020-10-09 DIAGNOSIS — M545 Low back pain, unspecified: Secondary | ICD-10-CM | POA: Diagnosis not present

## 2020-10-09 DIAGNOSIS — M546 Pain in thoracic spine: Secondary | ICD-10-CM | POA: Diagnosis not present

## 2020-10-09 DIAGNOSIS — M9905 Segmental and somatic dysfunction of pelvic region: Secondary | ICD-10-CM | POA: Diagnosis not present

## 2020-10-09 DIAGNOSIS — M9902 Segmental and somatic dysfunction of thoracic region: Secondary | ICD-10-CM | POA: Diagnosis not present

## 2020-10-09 DIAGNOSIS — M542 Cervicalgia: Secondary | ICD-10-CM | POA: Diagnosis not present

## 2020-10-09 DIAGNOSIS — M9904 Segmental and somatic dysfunction of sacral region: Secondary | ICD-10-CM | POA: Diagnosis not present

## 2020-10-09 DIAGNOSIS — M9901 Segmental and somatic dysfunction of cervical region: Secondary | ICD-10-CM | POA: Diagnosis not present

## 2020-10-19 ENCOUNTER — Telehealth: Payer: Self-pay | Admitting: Adult Health

## 2020-10-19 ENCOUNTER — Other Ambulatory Visit: Payer: Self-pay | Admitting: Neurology

## 2020-10-19 NOTE — Telephone Encounter (Signed)
Rescheduled appts per 1/21 Bertie scheduled change. Pt confirmed new appt date and time.

## 2020-10-20 MED FILL — EMGALITY 120 MG/ML SOAJ: 120 | 30 days supply | Qty: 1 | Fill #0

## 2020-10-26 ENCOUNTER — Encounter: Payer: 59 | Admitting: Adult Health

## 2020-10-30 ENCOUNTER — Other Ambulatory Visit: Payer: Self-pay

## 2020-10-30 ENCOUNTER — Inpatient Hospital Stay: Payer: 59 | Attending: Adult Health | Admitting: Adult Health

## 2020-10-30 ENCOUNTER — Encounter: Payer: Self-pay | Admitting: Adult Health

## 2020-10-30 VITALS — BP 97/65 | HR 80 | Temp 97.9°F | Resp 20 | Ht 65.0 in | Wt 139.6 lb

## 2020-10-30 DIAGNOSIS — Z923 Personal history of irradiation: Secondary | ICD-10-CM | POA: Insufficient documentation

## 2020-10-30 DIAGNOSIS — M8580 Other specified disorders of bone density and structure, unspecified site: Secondary | ICD-10-CM | POA: Diagnosis not present

## 2020-10-30 DIAGNOSIS — Z170 Estrogen receptor positive status [ER+]: Secondary | ICD-10-CM | POA: Insufficient documentation

## 2020-10-30 DIAGNOSIS — Z79811 Long term (current) use of aromatase inhibitors: Secondary | ICD-10-CM | POA: Diagnosis not present

## 2020-10-30 DIAGNOSIS — Z85850 Personal history of malignant neoplasm of thyroid: Secondary | ICD-10-CM | POA: Insufficient documentation

## 2020-10-30 DIAGNOSIS — D0512 Intraductal carcinoma in situ of left breast: Secondary | ICD-10-CM | POA: Insufficient documentation

## 2020-10-30 NOTE — Progress Notes (Signed)
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 Diagnostic mammogram showed a 0.5cm group of calcifications in the upper-outer left breast. Biopsy showed high grade DCIS, ER+ 100%, PR+ 90%.    02/03/2020 Surgery   Left lumpectomy [name]) [phone]): high grade DCIS, 1.2cm, negative margins, 1 left axillary lymph node negative for carcinoma.   02/03/2020 Cancer Staging   Staging form: Breast, AJCC 8th Edition - Pathologic stage from 02/03/2020: Stage 0 (pTis (DCIS), pN0, cM0, ER+, PR+)   03/09/2020 - 04/06/2020 Radiation Therapy   The patient initially received a dose of 42.56 Gy in 16 fractions to the breast using whole-breast tangent fields. This was delivered using a 3-D conformal technique. The patient then received a boost to the seroma. This delivered an additional 8 Gy in 20fractions using a 3 field photon technique due to the depth of the seroma. The total dose was 50.56 Gy.   03/27/2020 Genetic Testing   Negative genetic testing:  No pathogenic variants detected on the [facility] Common Hereditary Cancers Panel. The report date is 03/27/2020.  The Common Hereditary Cancers Panel offered by [facility] includes sequencing and/or deletion duplication testing of the following 48 genes: APC, ATM, AXIN2, BARD1, BMPR1A, BRCA1, BRCA2, BRIP1, CDH1, CDK4, CDKN2A (p14ARF), CDKN2A (p16INK4a), CHEK2, CTNNA1, DICER1, EPCAM (Deletion/duplication testing only), GREM1 (promoter region deletion/duplication testing only), KIT, MEN1, MLH1, MSH2, MSH3, MSH6, MUTYH, NBN, NF1, NTHL1, PALB2, PDGFRA, PMS2, POLD1, POLE, PTEN, RAD50, RAD51C, RAD51D, RNF43, SDHB, SDHC, SDHD, SMAD4, SMARCA4. STK11, TP53, TSC1, TSC2, and VHL.  The following genes were evaluated for sequence changes only: SDHA and HOXB13 c.251G>A variant only.    Anti-estrogen oral  therapy   Plan to begin Anastrozole x 5 years after bone density test.     INTERVAL HISTORY:  Gwendolyn Flores to review her survivorship care plan detailing her treatment course for breast cancer, as well as monitoring long-term side effects of that treatment, education regarding health maintenance, screening, and overall wellness and health promotion.     Overall, Gwendolyn Flores reports feeling quite well.  She has decided not to take anastrozole, due to her osteopenia.  She has some left breast soreness and heaviness.    REVIEW OF SYSTEMS:  Review of Systems  Constitutional: Negative for appetite change, chills, fatigue, fever and unexpected weight change.  HENT:   Negative for hearing loss, lump/mass and trouble swallowing.   Eyes: Negative for eye problems and icterus.  Respiratory: Negative for chest tightness, cough and shortness of breath.   Cardiovascular: Negative for chest pain, leg swelling and palpitations.  Gastrointestinal: Negative for abdominal distention, abdominal pain, constipation, diarrhea, nausea and vomiting.  Endocrine: Negative for hot flashes.  Genitourinary: Negative for difficulty urinating.   Musculoskeletal: Negative for arthralgias.  Skin: Negative for itching and rash.  Neurological: Negative for dizziness, extremity weakness, headaches and numbness.  Hematological: Negative for adenopathy. Does not bruise/bleed easily.  Psychiatric/Behavioral: Negative for depression. The patient is not nervous/anxious.    Breast: Denies any new nodularity, masses, tenderness, nipple changes, or nipple discharge.      ONCOLOGY TREATMENT TEAM:  1. Surgeon:  Dr. [name] at [facility] 2. Medical Oncologist: Dr. [name]  3. Radiation Oncologist: Dr. [name]    PAST MEDICAL/SURGICAL HISTORY:  Past Medical History:  Diagnosis Date  . Arthritis   . Breast cancer ([place]) 2021  . Cancer ([place])  thyroid cancer  . Chicken pox   . Family history of breast cancer    . Family history of colon cancer   . Family history of stomach cancer   . Family history of thyroid cancer   . Headache   . High cholesterol   . History of thyroid cancer 06/28/2013  . Rectocele 06/28/2013  . Thyroid disease    cancer  . Urinary tract infection    Past Surgical History:  Procedure Laterality Date  . EYE SURGERY  11/2019  . PARTIAL MASTECTOMY WITH NEEDLE LOCALIZATION AND AXILLARY SENTINEL LYMPH NODE BX Left 02/03/2020   Procedure: PARTIAL MASTECTOMY WITH NEEDLE LOCALIZATION AND AXILLARY SENTINEL LYMPH NODE BX;  Surgeon: Virl Cagey, MD;  Location: [facility];  Service: General;  Laterality: Left;  . THYROIDECTOMY    . TONSILECTOMY, ADENOIDECTOMY, BILATERAL MYRINGOTOMY AND TUBES     No ear surgery     ALLERGIES:  Allergies  Allergen Reactions  . Codeine Nausea And Vomiting  . Levaquin [Levofloxacin In D5w] Other (See Comments)    A fib; abnormal heart rhythm  . Sulfa Antibiotics Nausea And Vomiting     CURRENT MEDICATIONS:  Outpatient Encounter Medications as of 10/30/2020  Medication Sig  . Ascorbic Acid (VITAMIN C) 1000 MG tablet Take 1,000 mg by mouth daily.  Marland Kitchen aspirin-acetaminophen-caffeine (EXCEDRIN MIGRAINE) 250-250-65 MG tablet Take 1 tablet by mouth every 6 (six) hours as needed for headache.   . Calcium-Magnesium-Vitamin D (CALCIUM MAGNESIUM PO) Take 3 tablets by mouth daily.  . Diclofenac Potassium,Migraine, 50 MG PACK Take 50 mg by mouth once as needed. For migraine.  Marland Kitchen EMGALITY 120 MG/ML SOAJ INJECT 120 MG INTO THE SKIN EVERY 30 (THIRTY) DAYS.  Marland Kitchen ibuprofen (ADVIL,MOTRIN) 200 MG tablet Take 400 mg by mouth every 6 (six) hours as needed for moderate pain.   Marland Kitchen levothyroxine (SYNTHROID) 100 MCG tablet Take 100 mcg by mouth daily before breakfast.   . Magnesium Gluconate 550 MG TABS Take 1 tablet twice a day  . naproxen (NAPROSYN) 500 MG tablet Take with Imitrex for migraine or vertigo. (Patient taking differently: Take 500 mg by mouth daily as needed  (migraine or vertigo).)  . Rimegepant Sulfate (NURTEC) 75 MG TBDP Take 75 mg by mouth daily as needed. For migraines. Take as close to onset of migraine as possible. One daily maximum.  . SUMAtriptan (IMITREX) 100 MG tablet Take 1 tablet (100 mg total) by mouth every 2 (two) hours as needed for migraine.  Marland Kitchen VITAMIN D PO Take 1 tablet by mouth daily.    No facility-administered encounter medications on file as of 10/30/2020.     ONCOLOGIC FAMILY HISTORY:  Family History  Problem Relation Age of Onset  . Heart disease Father        heart attack  . Diabetes Brother   . Stomach cancer Paternal Grandfather 41  . Congestive Heart Failure Paternal Grandmother   . Dementia Maternal Grandmother   . ALS Maternal Grandfather   . Thyroid cancer Other        dx. >50 (maternal grandmother's sister)  . Stroke Maternal Uncle   . Breast cancer Cousin        dx. in her 52s (paternal cousin)  . Hydrocephalus Paternal Uncle        "special needs"  . Colon cancer Maternal Great-grandmother 44       maternal grandmother's mother  . Migraines Neg Hx      GENETIC COUNSELING/TESTING: See above  SOCIAL HISTORY:  Social History   Socioeconomic History  . Marital status: Married    Spouse name: Elta Guadeloupe   . Number of children: 2  . Years of education: 72  . Highest education level: Not on file  Occupational History  . Occupation: Therapist, sports  Tobacco Use  . Smoking status: Never Smoker  . Smokeless tobacco: Never Used  Vaping Use  . Vaping Use: Never used  Substance and Sexual Activity  . Alcohol use: Yes    Comment: wine socially  . Drug use: No  . Sexual activity: Yes    Birth control/protection: Other-see comments, Surgical    Comment: vasectomy  Other Topics Concern  . Not on file  Social History Narrative   Lives at home with husband and children.   Caffeine use: 1 cup per day   Right handed   Social Determinants of Health   Financial Resource Strain: Low Risk   . Difficulty of Paying  Living Expenses: Not hard at all  Food Insecurity: No Food Insecurity  . Worried About Charity fundraiser in the Last Year: Never true  . Ran Out of Food in the Last Year: Never true  Transportation Needs: No Transportation Needs  . Lack of Transportation (Medical): No  . Lack of Transportation (Non-Medical): No  Physical Activity: Sufficiently Active  . Days of Exercise per Week: 3 days  . Minutes of Exercise per Session: 60 min  Stress: No Stress Concern Present  . Feeling of Stress : Only a little  Social Connections: Socially Integrated  . Frequency of Communication with Friends and Family: More than three times a week  . Frequency of Social Gatherings with Friends and Family: Twice a week  . Attends Religious Services: More than 4 times per year  . Active Member of Clubs or Organizations: Yes  . Attends Archivist Meetings: More than 4 times per year  . Marital Status: Married  Human resources officer Violence: Not At Risk  . Fear of Current or Ex-Partner: No  . Emotionally Abused: No  . Physically Abused: No  . Sexually Abused: No     OBSERVATIONS/OBJECTIVE:  BP 97/65 (BP Location: Right Arm, Patient Position: Sitting)   Pulse 80   Temp 97.9 F (36.6 C) (Temporal)   Resp 20   Ht $R'5\' 5"'qC$  (1.651 m)   Wt 139 lb 9.6 oz (63.3 kg)   LMP 08/12/2016 Comment: GYN - Jay  SpO2 99%   BMI 23.23 kg/m  GENERAL: Patient is a well appearing female in no acute distress HEENT:  Sclerae anicteric.  Mask in place. Neck is supple.  NODES:  No cervical, supraclavicular, or axillary lymphadenopathy palpated.  BREAST EXAM:  Deferred. LUNGS:  Clear to auscultation bilaterally.  No wheezes or rhonchi. HEART:  Regular rate and rhythm. No murmur appreciated. ABDOMEN:  Soft, nontender.  Positive, normoactive bowel sounds. No organomegaly palpated. MSK:  No focal spinal tenderness to palpation. Full range of motion bilaterally in the upper extremities. EXTREMITIES:  No peripheral  edema.   SKIN:  Clear with no obvious rashes or skin changes. No nail dyscrasia. NEURO:  Nonfocal. Well oriented.  Appropriate affect.    LABORATORY DATA:  None for this visit.  DIAGNOSTIC IMAGING:  None for this visit.      ASSESSMENT AND PLAN:  Gwendolyn Flores is a pleasant 55 y.o. female with Stage 0 left breast DCIS, ER+/PR+/HER2-, diagnosed in 01/2020, treated with lumpectomy, adjuvant radiation therapy, and anti-estrogen therapy with Anastrozole to begin after bone density testing.  She presents to the Survivorship Clinic for our initial meeting and routine follow-up post-completion of treatment for breast cancer.    1. Stage 0 left breast cancer:  Gwendolyn Flores is continuing to recover from definitive treatment for breast cancer. She will follow-up with her medical oncologist, Dr. [name] in 1 year with history and physical exam per surveillance protocol.  Her mammogram is due 12/2020; orders placed today. Today, a comprehensive survivorship care plan and treatment summary was reviewed with the patient today detailing her breast cancer diagnosis, treatment course, potential late/long-term effects of treatment, appropriate follow-up care with recommendations for the future, and patient education resources.  A copy of this summary, along with a letter will be sent to the patient's primary care provider via mail/fax/In Basket message after today's visit.    2. Bone health:  Given Gwendolyn Flores age/history of breast cancer and potential treatment with anti-estrogen therapy Anastrozole, she is at risk for bone demineralization.  Her last DEXA scan was 04/2020, which showed mild osteopenia.  In the meantime, she was encouraged to increase her consumption of foods rich in calcium, as well as increase her weight-bearing activities.  She was given education on specific activities to promote bone health.  3. Cancer screening:  Due to Gwendolyn Flores history and her age, she should receive screening for skin  cancers, colon cancer, and gynecologic cancers.  The information and recommendations are listed on the patient's comprehensive care plan/treatment summary and were reviewed in detail with the patient.    4. Health maintenance and wellness promotion: Gwendolyn Flores was encouraged to consume 5-7 servings of fruits and vegetables per day. We reviewed the "Nutrition Rainbow" handout, as well as the handout "Take Control of Your Health and Reduce Your Cancer Risk" from the [employer].  She was also encouraged to engage in moderate to vigorous exercise for 30 minutes per day most days of the week. We discussed the [employer] fitness program, which is designed for cancer survivors to help them become more physically fit after cancer treatments.  She was instructed to limit her alcohol consumption and continue to abstain from tobacco use.     5. Support services/counseling: It is not uncommon for this period of the patient's cancer care trajectory to be one of many emotions and stressors.  We discussed how this can be increasingly difficult during the times of quarantine and social distancing due to the COVID-19 pandemic.   She was given information regarding our available services and encouraged to contact me with any questions or for help enrolling in any of our support group/programs.    Follow up instructions:    -Return to cancer center in one year for f/u with Dr. [name]  -Mammogram due in 12/2020 -Follow up with surgery in 6 months -She is welcome to return back to the Survivorship Clinic at any time; no additional follow-up needed at this time.  -Consider referral back to survivorship as a long-term survivor for continued surveillance  The patient was provided an opportunity to ask questions and all were answered. The patient agreed with the plan and demonstrated an understanding of the instructions.   Total encounter time: 30 minutes*  Wilber Bihari, NP 10/30/20 2:27 PM Medical  Oncology and Hematology [facility] [place], [place] [address] Tel. [phone]    Fax. [phone]  *Total Encounter Time as defined by the Centers for Medicare and Medicaid Services includes, in addition to the face-to-face time of a patient visit (  documented in the note above) non-face-to-face time: obtaining and reviewing outside history, ordering and reviewing medications, tests or procedures, care coordination (communications with other health care professionals or caregivers) and documentation in the medical record.

## 2020-11-02 ENCOUNTER — Telehealth: Payer: Self-pay | Admitting: Adult Health

## 2020-11-02 DIAGNOSIS — M9901 Segmental and somatic dysfunction of cervical region: Secondary | ICD-10-CM | POA: Diagnosis not present

## 2020-11-02 DIAGNOSIS — M546 Pain in thoracic spine: Secondary | ICD-10-CM | POA: Diagnosis not present

## 2020-11-02 DIAGNOSIS — M545 Low back pain, unspecified: Secondary | ICD-10-CM | POA: Diagnosis not present

## 2020-11-02 DIAGNOSIS — M9903 Segmental and somatic dysfunction of lumbar region: Secondary | ICD-10-CM | POA: Diagnosis not present

## 2020-11-02 DIAGNOSIS — M9902 Segmental and somatic dysfunction of thoracic region: Secondary | ICD-10-CM | POA: Diagnosis not present

## 2020-11-02 DIAGNOSIS — M542 Cervicalgia: Secondary | ICD-10-CM | POA: Diagnosis not present

## 2020-11-02 DIAGNOSIS — M9904 Segmental and somatic dysfunction of sacral region: Secondary | ICD-10-CM | POA: Diagnosis not present

## 2020-11-02 DIAGNOSIS — M9905 Segmental and somatic dysfunction of pelvic region: Secondary | ICD-10-CM | POA: Diagnosis not present

## 2020-11-02 NOTE — Telephone Encounter (Signed)
Scheduled appts per 2/4 los. Left voicemail with appt date and time.  

## 2020-11-10 MED FILL — SYNTHROID 100 MCG TABLET: 100 | 90 days supply | Qty: 84 | Fill #3

## 2020-11-30 DIAGNOSIS — M9901 Segmental and somatic dysfunction of cervical region: Secondary | ICD-10-CM | POA: Diagnosis not present

## 2020-11-30 DIAGNOSIS — M9905 Segmental and somatic dysfunction of pelvic region: Secondary | ICD-10-CM | POA: Diagnosis not present

## 2020-11-30 DIAGNOSIS — M545 Low back pain, unspecified: Secondary | ICD-10-CM | POA: Diagnosis not present

## 2020-11-30 DIAGNOSIS — M9902 Segmental and somatic dysfunction of thoracic region: Secondary | ICD-10-CM | POA: Diagnosis not present

## 2020-11-30 DIAGNOSIS — M546 Pain in thoracic spine: Secondary | ICD-10-CM | POA: Diagnosis not present

## 2020-11-30 DIAGNOSIS — M9903 Segmental and somatic dysfunction of lumbar region: Secondary | ICD-10-CM | POA: Diagnosis not present

## 2020-11-30 DIAGNOSIS — M9904 Segmental and somatic dysfunction of sacral region: Secondary | ICD-10-CM | POA: Diagnosis not present

## 2020-11-30 DIAGNOSIS — M542 Cervicalgia: Secondary | ICD-10-CM | POA: Diagnosis not present

## 2020-12-08 ENCOUNTER — Ambulatory Visit (INDEPENDENT_AMBULATORY_CARE_PROVIDER_SITE_OTHER): Payer: 59 | Admitting: Neurology

## 2020-12-08 DIAGNOSIS — G43709 Chronic migraine without aura, not intractable, without status migrainosus: Secondary | ICD-10-CM | POA: Diagnosis not present

## 2020-12-08 NOTE — Progress Notes (Signed)
Botox- 200 units x 1 vial Lot: [id number] Expiration: 08/2023 NDC: 0023-3921-02  Bacteriostatic 0.9% Sodium Chloride- 4mL total Lot: [id number] Expiration: 10/27/2021 NDC: 0409-1966-02  Dx: G43.709 B/B  

## 2020-12-08 NOTE — Progress Notes (Signed)
12/08/2020: Stable, doing great  Interval history 07/07/2020: Botox extremely beneficial. She was having 15 headache days a month with 8 severe migraines but since we started botox she is extremely better still > 90% improvement. Imitrex works well. Emgality has really helped. +20U each masseter, at the hairline as well, no procerus or corrugators or frontalis for this patient, minimal in the cervical muscles.    Interval history 02/05/2020: Botox extremely beneficial. She was having 15 headache days a month with 8 severe migraines but since we started botox she is extremely better > 90% improvement. Imitrex works well. Emgality has really helped. She is doing well right now. Trokendi titrated up to 50mg  and helped but had hair loss. Could try Qudexy, or zonegran. She did not see a difference with the Ajovy vs emgality vs aimovig,  The remaining headaches are more tension tyoe, tensionin the temporal lobes always sometimes in the back. Dry needling helped but she sees a Restaurant manager, fast food.  Lightly in the cervical spinal muscles last time she felt weak. She thinks she has fibromyalgia, we will perform a rheumatologic evaluation due to her joint pain and FHx of autoimmune disorders. She has pain in the bilateral SCMs behind the ears, very proximally  Consent Form Botulism Toxin Injection For Chronic Migraine    Reviewed orally with patient, additionally signature is on file:  Botulism toxin has been approved by the Federal drug administration for treatment of chronic migraine. Botulism toxin does not cure chronic migraine and it may not be effective in some patients.  The administration of botulism toxin is accomplished by injecting a small amount of toxin into the muscles of the neck and head. Dosage must be titrated for each individual. Any benefits resulting from botulism toxin tend to wear off after 3 months with a repeat injection required if benefit is to be maintained. Injections are usually done every  3-4 months with maximum effect peak achieved by about 2 or 3 weeks. Botulism toxin is expensive and you should be sure of what costs you will incur resulting from the injection.  The side effects of botulism toxin use for chronic migraine may include:   -Transient, and usually mild, facial weakness with facial injections  -Transient, and usually mild, head or neck weakness with head/neck injections  -Reduction or loss of forehead facial animation due to forehead muscle weakness  -Eyelid drooping  -Dry eye  -Pain at the site of injection or bruising at the site of injection  -Double vision  -Potential unknown long term risks  Contraindications: You should not have Botox if you are pregnant, nursing, allergic to albumin, have an infection, skin condition, or muscle weakness at the site of the injection, or have myasthenia gravis, Lambert-Eaton syndrome, or ALS.  It is also possible that as with any injection, there may be an allergic reaction or no effect from the medication. Reduced effectiveness after repeated injections is sometimes seen and rarely infection at the injection site may occur. All care will be taken to prevent these side effects. If therapy is given over a long time, atrophy and wasting in the muscle injected may occur. Occasionally the patient's become refractory to treatment because they develop antibodies to the toxin. In this event, therapy needs to be modified.  I have read the above information and consent to the administration of botulism toxin.    BOTOX PROCEDURE NOTE FOR MIGRAINE HEADACHE    Contraindications and precautions discussed with patient(above). Aseptic procedure was observed and patient tolerated procedure. Procedure  performed by Dr. Georgia Dom  The condition has existed for more than 6 months, and pt does not have a diagnosis of ALS, Myasthenia Gravis or Lambert-Eaton Syndrome.  Risks and benefits of injections discussed and pt agrees to proceed with the  procedure.  Written consent obtained  These injections are medically necessary. Pt  receives good benefits from these injections. These injections do not cause sedations or hallucinations which the oral therapies may cause.  Description of procedure:  The patient was placed in a sitting position. The standard protocol was used for Botox as follows, with 5 units of Botox injected at each site:   -Procerus muscle, midline injection  -Corrugator muscle, bilateral injection  -Frontalis muscle, bilateral injection, with 2 sites each side, medial injection was performed in the upper one third of the frontalis muscle, in the region vertical from the medial inferior edge of the superior orbital rim. The lateral injection was again in the upper one third of the forehead vertically above the lateral limbus of the cornea, 1.5 cm lateral to the medial injection site.  -Temporalis muscle injection, 4 sites, bilaterally. The first injection was 3 cm above the tragus of the ear, second injection site was 1.5 cm to 3 cm up from the first injection site in line with the tragus of the ear. The third injection site was 1.5-3 cm forward between the first 2 injection sites. The fourth injection site was 1.5 cm posterior to the second injection site.   -Occipitalis muscle injection, 3 sites, bilaterally. The first injection was done one half way between the occipital protuberance and the tip of the mastoid process behind the ear. The second injection site was done lateral and superior to the first, 1 fingerbreadth from the first injection. The third injection site was 1 fingerbreadth superiorly and medially from the first injection site.  -Cervical paraspinal muscle injection, 2 sites, bilateral knee first injection site was 1 cm from the midline of the cervical spine, 3 cm inferior to the lower border of the occipital protuberance. The second injection site was 1.5 cm superiorly and laterally to the first injection  site.  -Trapezius muscle injection was performed at 3 sites, bilaterally. The first injection site was in the upper trapezius muscle halfway between the inflection point of the neck, and the acromion. The second injection site was one half way between the acromion and the first injection site. The third injection was done between the first injection site and the inflection point of the neck.   Will return for repeat injection in 3 months.   200 units of Botox was used, any Botox not injected was wasted. The patient tolerated the procedure well, there were no complications of the above procedure.

## 2020-12-21 ENCOUNTER — Ambulatory Visit
Admission: RE | Admit: 2020-12-21 | Discharge: 2020-12-21 | Disposition: A | Discharge status: Home / Self Care | Payer: 59 | Source: Ambulatory Visit | Attending: Adult Health | Admitting: Adult Health

## 2020-12-21 ENCOUNTER — Other Ambulatory Visit: Payer: Self-pay

## 2020-12-21 DIAGNOSIS — D0512 Intraductal carcinoma in situ of left breast: Secondary | ICD-10-CM

## 2020-12-21 DIAGNOSIS — R922 Inconclusive mammogram: Secondary | ICD-10-CM | POA: Diagnosis not present

## 2020-12-21 DIAGNOSIS — Z853 Personal history of malignant neoplasm of breast: Secondary | ICD-10-CM | POA: Diagnosis not present

## 2020-12-26 ENCOUNTER — Other Ambulatory Visit (HOSPITAL_COMMUNITY): Payer: Self-pay

## 2021-01-01 DIAGNOSIS — M9901 Segmental and somatic dysfunction of cervical region: Secondary | ICD-10-CM | POA: Diagnosis not present

## 2021-01-01 DIAGNOSIS — M546 Pain in thoracic spine: Secondary | ICD-10-CM | POA: Diagnosis not present

## 2021-01-01 DIAGNOSIS — M9905 Segmental and somatic dysfunction of pelvic region: Secondary | ICD-10-CM | POA: Diagnosis not present

## 2021-01-01 DIAGNOSIS — M545 Low back pain, unspecified: Secondary | ICD-10-CM | POA: Diagnosis not present

## 2021-01-01 DIAGNOSIS — M542 Cervicalgia: Secondary | ICD-10-CM | POA: Diagnosis not present

## 2021-01-01 DIAGNOSIS — M9904 Segmental and somatic dysfunction of sacral region: Secondary | ICD-10-CM | POA: Diagnosis not present

## 2021-01-01 DIAGNOSIS — M9902 Segmental and somatic dysfunction of thoracic region: Secondary | ICD-10-CM | POA: Diagnosis not present

## 2021-01-01 DIAGNOSIS — M9903 Segmental and somatic dysfunction of lumbar region: Secondary | ICD-10-CM | POA: Diagnosis not present

## 2021-01-05 ENCOUNTER — Other Ambulatory Visit (HOSPITAL_COMMUNITY): Payer: Self-pay

## 2021-01-11 ENCOUNTER — Other Ambulatory Visit (HOSPITAL_COMMUNITY): Payer: Self-pay

## 2021-01-11 MED FILL — Galcanezumab-gnlm Subcutaneous Soln Auto-Injector 120 MG/ML: SUBCUTANEOUS | 30 days supply | Qty: 1 | Fill #0 | Status: AC

## 2021-02-05 DIAGNOSIS — Z833 Family history of diabetes mellitus: Secondary | ICD-10-CM | POA: Diagnosis not present

## 2021-02-05 DIAGNOSIS — C73 Malignant neoplasm of thyroid gland: Secondary | ICD-10-CM | POA: Diagnosis not present

## 2021-02-05 DIAGNOSIS — E890 Postprocedural hypothyroidism: Secondary | ICD-10-CM | POA: Diagnosis not present

## 2021-02-05 DIAGNOSIS — E559 Vitamin D deficiency, unspecified: Secondary | ICD-10-CM | POA: Diagnosis not present

## 2021-02-08 DIAGNOSIS — M546 Pain in thoracic spine: Secondary | ICD-10-CM | POA: Diagnosis not present

## 2021-02-08 DIAGNOSIS — M9903 Segmental and somatic dysfunction of lumbar region: Secondary | ICD-10-CM | POA: Diagnosis not present

## 2021-02-08 DIAGNOSIS — M542 Cervicalgia: Secondary | ICD-10-CM | POA: Diagnosis not present

## 2021-02-08 DIAGNOSIS — M9902 Segmental and somatic dysfunction of thoracic region: Secondary | ICD-10-CM | POA: Diagnosis not present

## 2021-02-08 DIAGNOSIS — M545 Low back pain, unspecified: Secondary | ICD-10-CM | POA: Diagnosis not present

## 2021-02-08 DIAGNOSIS — M9904 Segmental and somatic dysfunction of sacral region: Secondary | ICD-10-CM | POA: Diagnosis not present

## 2021-02-08 DIAGNOSIS — M9905 Segmental and somatic dysfunction of pelvic region: Secondary | ICD-10-CM | POA: Diagnosis not present

## 2021-02-08 DIAGNOSIS — M9901 Segmental and somatic dysfunction of cervical region: Secondary | ICD-10-CM | POA: Diagnosis not present

## 2021-02-09 ENCOUNTER — Other Ambulatory Visit (HOSPITAL_COMMUNITY): Payer: Self-pay

## 2021-02-09 MED FILL — Galcanezumab-gnlm Subcutaneous Soln Auto-Injector 120 MG/ML: SUBCUTANEOUS | 30 days supply | Qty: 1 | Fill #1 | Status: AC

## 2021-02-12 ENCOUNTER — Other Ambulatory Visit (HOSPITAL_COMMUNITY): Payer: Self-pay

## 2021-02-12 DIAGNOSIS — Z833 Family history of diabetes mellitus: Secondary | ICD-10-CM | POA: Diagnosis not present

## 2021-02-12 DIAGNOSIS — E7800 Pure hypercholesterolemia, unspecified: Secondary | ICD-10-CM | POA: Diagnosis not present

## 2021-02-12 DIAGNOSIS — E559 Vitamin D deficiency, unspecified: Secondary | ICD-10-CM | POA: Diagnosis not present

## 2021-02-12 DIAGNOSIS — C73 Malignant neoplasm of thyroid gland: Secondary | ICD-10-CM | POA: Diagnosis not present

## 2021-02-12 DIAGNOSIS — M85852 Other specified disorders of bone density and structure, left thigh: Secondary | ICD-10-CM | POA: Diagnosis not present

## 2021-02-12 DIAGNOSIS — E890 Postprocedural hypothyroidism: Secondary | ICD-10-CM | POA: Diagnosis not present

## 2021-02-12 MED ORDER — SYNTHROID 100 MCG PO TABS
ORAL_TABLET | ORAL | 4 refills | Status: DC
  Filled 2021-02-12: qty 78, 84d supply, fill #0
  Filled 2021-05-03: qty 78, 84d supply, fill #1
  Filled 2021-08-02: qty 78, 84d supply, fill #2
  Filled 2021-10-18: qty 78, 84d supply, fill #3
  Filled 2022-01-17: qty 78, 84d supply, fill #4

## 2021-02-15 ENCOUNTER — Other Ambulatory Visit (HOSPITAL_COMMUNITY): Payer: Self-pay

## 2021-03-11 ENCOUNTER — Other Ambulatory Visit (HOSPITAL_COMMUNITY): Payer: Self-pay

## 2021-03-11 MED FILL — Galcanezumab-gnlm Subcutaneous Soln Auto-Injector 120 MG/ML: SUBCUTANEOUS | 30 days supply | Qty: 1 | Fill #2 | Status: CN

## 2021-03-12 DIAGNOSIS — M9901 Segmental and somatic dysfunction of cervical region: Secondary | ICD-10-CM | POA: Diagnosis not present

## 2021-03-12 DIAGNOSIS — M9903 Segmental and somatic dysfunction of lumbar region: Secondary | ICD-10-CM | POA: Diagnosis not present

## 2021-03-12 DIAGNOSIS — M9902 Segmental and somatic dysfunction of thoracic region: Secondary | ICD-10-CM | POA: Diagnosis not present

## 2021-03-12 DIAGNOSIS — M9904 Segmental and somatic dysfunction of sacral region: Secondary | ICD-10-CM | POA: Diagnosis not present

## 2021-03-12 DIAGNOSIS — M9905 Segmental and somatic dysfunction of pelvic region: Secondary | ICD-10-CM | POA: Diagnosis not present

## 2021-03-12 DIAGNOSIS — M542 Cervicalgia: Secondary | ICD-10-CM | POA: Diagnosis not present

## 2021-03-12 DIAGNOSIS — M546 Pain in thoracic spine: Secondary | ICD-10-CM | POA: Diagnosis not present

## 2021-03-12 DIAGNOSIS — M545 Low back pain, unspecified: Secondary | ICD-10-CM | POA: Diagnosis not present

## 2021-03-15 DIAGNOSIS — Z1283 Encounter for screening for malignant neoplasm of skin: Secondary | ICD-10-CM | POA: Diagnosis not present

## 2021-03-15 DIAGNOSIS — I781 Nevus, non-neoplastic: Secondary | ICD-10-CM | POA: Diagnosis not present

## 2021-03-15 DIAGNOSIS — L820 Inflamed seborrheic keratosis: Secondary | ICD-10-CM | POA: Diagnosis not present

## 2021-03-15 DIAGNOSIS — D225 Melanocytic nevi of trunk: Secondary | ICD-10-CM | POA: Diagnosis not present

## 2021-03-17 ENCOUNTER — Other Ambulatory Visit (HOSPITAL_COMMUNITY): Payer: Self-pay

## 2021-03-20 IMAGING — MG MM PLC BREAST LOC DEV 1ST LESION INC MAMMO GUIDE*L*
5 series · 5 of 5 positions shown · non-contrast
Comparison: 01/20/2020

CLINICAL DATA: DCIS LEFT breast

EXAM:
NEEDLE LOCALIZATION OF THE LEFT BREAST WITH MAMMO GUIDANCE

[L ML (1 of 3)]
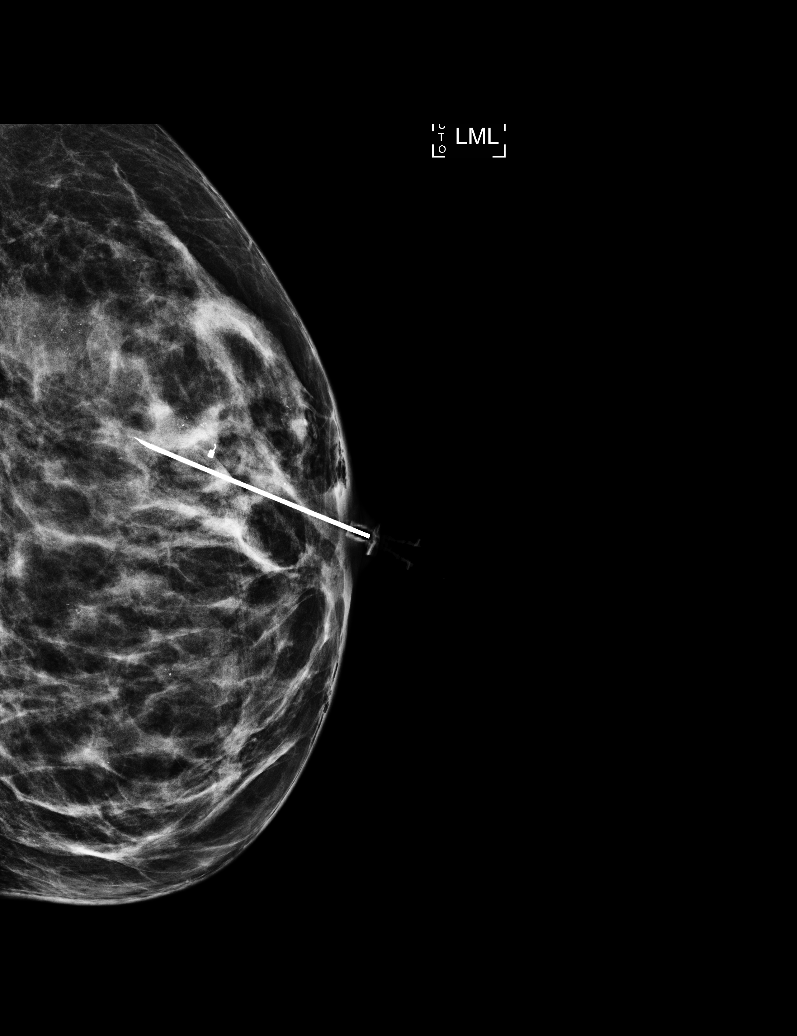

[L CC (1 of 2)]
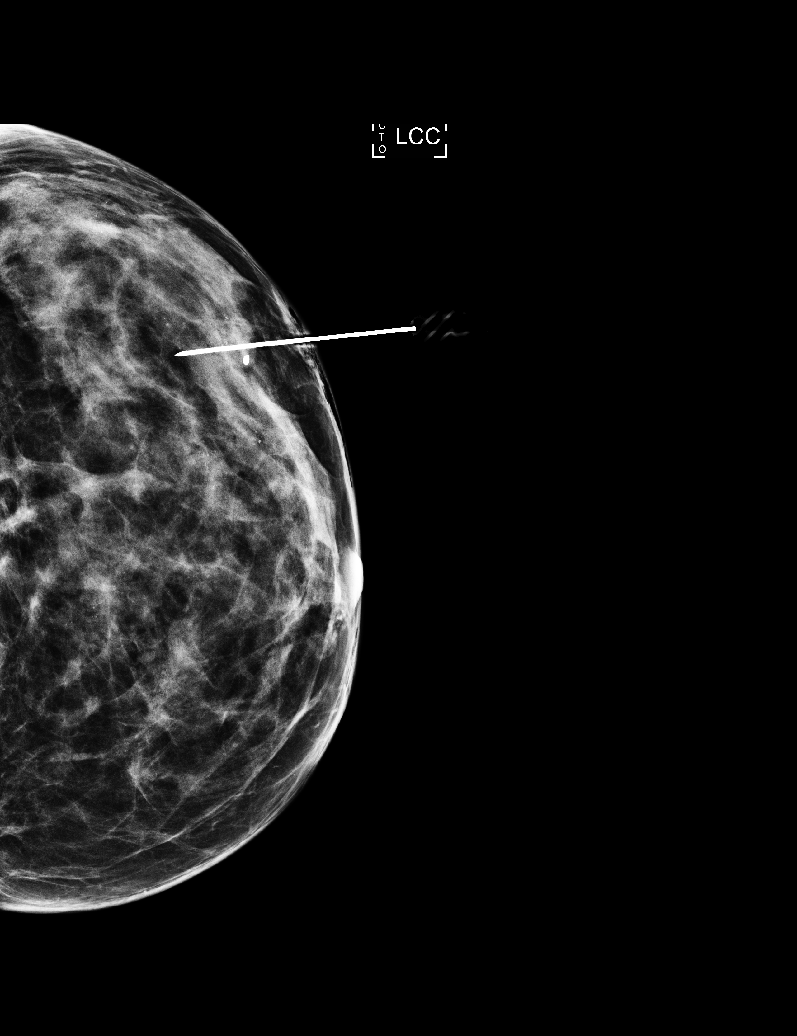

[L CC (2 of 2)]
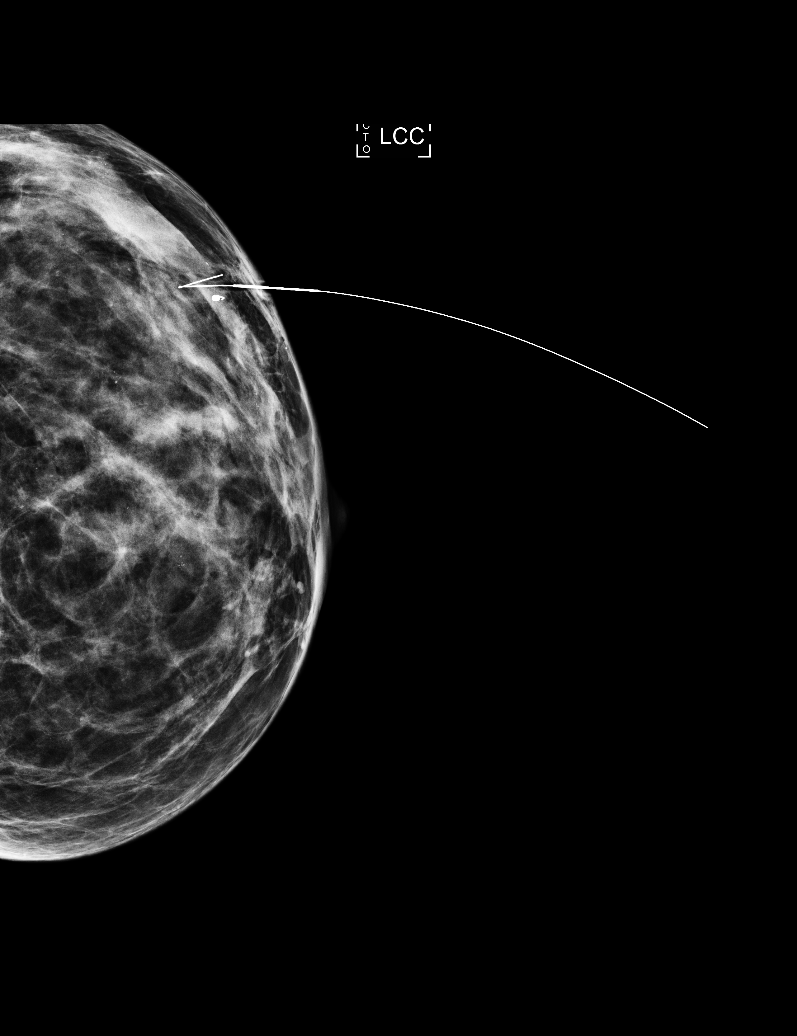

[L ML (2 of 3)]
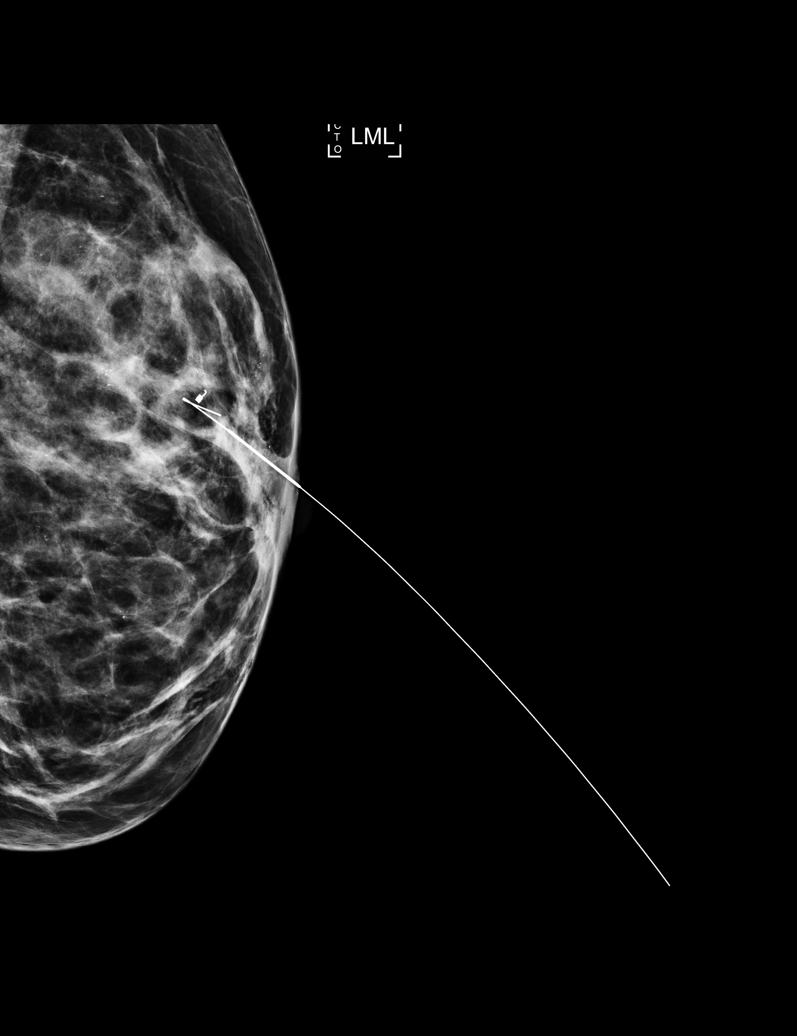

[L ML (3 of 3)]
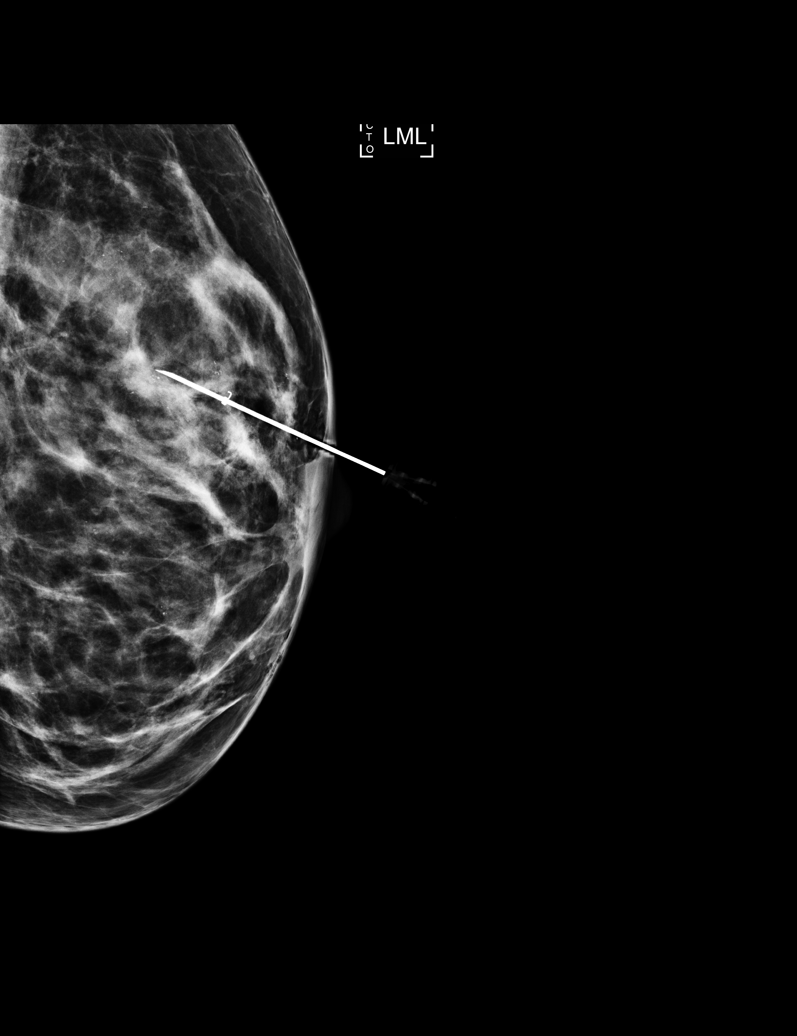

[5 of 5 positions shown; findings below may reference images not displayed]

PROCEDURE:
Patient presents for needle localization prior to lumpectomy. I met
with the patient and we discussed the procedure of needle
localization including benefits and alternatives. We discussed the
high likelihood of a successful procedure. We discussed the risks of
the procedure, including infection, bleeding, tissue injury, and
further surgery. Informed, written consent was given. The usual
time-out protocol was performed immediately prior to the procedure.

Using mammographic guidance, sterile technique, 1% lidocaine and a 5
cm length modified Kopans needle, biopsy clip and adjacent
calcifications localized using anterior free hand approach. The
images were marked for Dr. Moolman.
IMPRESSION: Needle localization LEFT breast. No apparent complications.

Images reviewed with Dr. Moolman prior to surgery.

## 2021-03-22 ENCOUNTER — Other Ambulatory Visit (HOSPITAL_COMMUNITY): Payer: Self-pay

## 2021-03-22 MED FILL — Galcanezumab-gnlm Subcutaneous Soln Auto-Injector 120 MG/ML: SUBCUTANEOUS | 30 days supply | Qty: 1 | Fill #2 | Status: CN

## 2021-03-23 ENCOUNTER — Telehealth: Payer: Self-pay | Admitting: *Deleted

## 2021-03-23 NOTE — Telephone Encounter (Signed)
Completed Emgality PA on Cover My Meds. KeyLoletta Specter PA Case ID: [id number] - Rx #: [id number]. Approved immediately by [employer].  The request has been approved. The authorization is effective for a maximum of 12 fills from 03/23/2021 to 03/22/2022, as long as the member is enrolled in their current health plan. This has been approved for a quantity limit of 1 with a day supply limit of 30. A written notification letter will follow with additional details.

## 2021-03-30 ENCOUNTER — Other Ambulatory Visit: Payer: Self-pay | Admitting: Neurology

## 2021-04-02 ENCOUNTER — Other Ambulatory Visit (HOSPITAL_COMMUNITY): Payer: Self-pay

## 2021-04-02 MED FILL — Galcanezumab-gnlm Subcutaneous Soln Auto-Injector 120 MG/ML: SUBCUTANEOUS | 30 days supply | Qty: 1 | Fill #2 | Status: CN

## 2021-05-03 ENCOUNTER — Other Ambulatory Visit (HOSPITAL_COMMUNITY): Payer: Self-pay

## 2021-05-03 DIAGNOSIS — M9905 Segmental and somatic dysfunction of pelvic region: Secondary | ICD-10-CM | POA: Diagnosis not present

## 2021-05-03 DIAGNOSIS — M9904 Segmental and somatic dysfunction of sacral region: Secondary | ICD-10-CM | POA: Diagnosis not present

## 2021-05-03 DIAGNOSIS — M9903 Segmental and somatic dysfunction of lumbar region: Secondary | ICD-10-CM | POA: Diagnosis not present

## 2021-05-03 DIAGNOSIS — M542 Cervicalgia: Secondary | ICD-10-CM | POA: Diagnosis not present

## 2021-05-03 DIAGNOSIS — M9901 Segmental and somatic dysfunction of cervical region: Secondary | ICD-10-CM | POA: Diagnosis not present

## 2021-05-03 DIAGNOSIS — M545 Low back pain, unspecified: Secondary | ICD-10-CM | POA: Diagnosis not present

## 2021-05-03 DIAGNOSIS — M9902 Segmental and somatic dysfunction of thoracic region: Secondary | ICD-10-CM | POA: Diagnosis not present

## 2021-05-03 DIAGNOSIS — M546 Pain in thoracic spine: Secondary | ICD-10-CM | POA: Diagnosis not present

## 2021-05-11 ENCOUNTER — Ambulatory Visit: Payer: Self-pay | Admitting: Neurology

## 2021-06-07 DIAGNOSIS — M545 Low back pain, unspecified: Secondary | ICD-10-CM | POA: Diagnosis not present

## 2021-06-07 DIAGNOSIS — M9905 Segmental and somatic dysfunction of pelvic region: Secondary | ICD-10-CM | POA: Diagnosis not present

## 2021-06-07 DIAGNOSIS — M9903 Segmental and somatic dysfunction of lumbar region: Secondary | ICD-10-CM | POA: Diagnosis not present

## 2021-06-07 DIAGNOSIS — M542 Cervicalgia: Secondary | ICD-10-CM | POA: Diagnosis not present

## 2021-06-07 DIAGNOSIS — M546 Pain in thoracic spine: Secondary | ICD-10-CM | POA: Diagnosis not present

## 2021-06-07 DIAGNOSIS — M9901 Segmental and somatic dysfunction of cervical region: Secondary | ICD-10-CM | POA: Diagnosis not present

## 2021-06-07 DIAGNOSIS — M9902 Segmental and somatic dysfunction of thoracic region: Secondary | ICD-10-CM | POA: Diagnosis not present

## 2021-06-07 DIAGNOSIS — M9904 Segmental and somatic dysfunction of sacral region: Secondary | ICD-10-CM | POA: Diagnosis not present

## 2021-06-25 ENCOUNTER — Ambulatory Visit (INDEPENDENT_AMBULATORY_CARE_PROVIDER_SITE_OTHER): Payer: 59 | Admitting: Internal Medicine

## 2021-06-25 ENCOUNTER — Other Ambulatory Visit: Payer: Self-pay

## 2021-06-25 ENCOUNTER — Encounter: Payer: Self-pay | Admitting: Internal Medicine

## 2021-06-25 VITALS — BP 98/64 | HR 89 | Temp 98.0°F | Ht 65.0 in | Wt 133.9 lb

## 2021-06-25 DIAGNOSIS — E890 Postprocedural hypothyroidism: Secondary | ICD-10-CM

## 2021-06-25 DIAGNOSIS — Z85850 Personal history of malignant neoplasm of thyroid: Secondary | ICD-10-CM

## 2021-06-25 DIAGNOSIS — G43009 Migraine without aura, not intractable, without status migrainosus: Secondary | ICD-10-CM | POA: Diagnosis not present

## 2021-06-25 DIAGNOSIS — D0512 Intraductal carcinoma in situ of left breast: Secondary | ICD-10-CM | POA: Diagnosis not present

## 2021-06-25 DIAGNOSIS — Z0000 Encounter for general adult medical examination without abnormal findings: Secondary | ICD-10-CM | POA: Diagnosis not present

## 2021-06-25 LAB — CBC WITH DIFFERENTIAL/PLATELET
Basophils Absolute: 0 10*3/uL (ref 0.0–0.1)
Basophils Relative: 0.6 % (ref 0.0–3.0)
Eosinophils Absolute: 0.1 10*3/uL (ref 0.0–0.7)
Eosinophils Relative: 1.2 % (ref 0.0–5.0)
HCT: 41.6 % (ref 36.0–46.0)
Hemoglobin: 14 g/dL (ref 12.0–15.0)
Lymphocytes Relative: 43.1 % (ref 12.0–46.0)
Lymphs Abs: 1.8 10*3/uL (ref 0.7–4.0)
MCHC: 33.6 g/dL (ref 30.0–36.0)
MCV: 96.4 fl (ref 78.0–100.0)
Monocytes Absolute: 0.4 10*3/uL (ref 0.1–1.0)
Monocytes Relative: 8.8 % (ref 3.0–12.0)
Neutro Abs: 2 10*3/uL (ref 1.4–7.7)
Neutrophils Relative %: 46.3 % (ref 43.0–77.0)
Platelets: 238 10*3/uL (ref 150.0–400.0)
RBC: 4.32 Mil/uL (ref 3.87–5.11)
RDW: 13.1 % (ref 11.5–15.5)
WBC: 4.2 10*3/uL (ref 4.0–10.5)

## 2021-06-25 LAB — COMPREHENSIVE METABOLIC PANEL
ALT: 21 U/L (ref 0–35)
AST: 21 U/L (ref 0–37)
Albumin: 4.4 g/dL (ref 3.5–5.2)
Alkaline Phosphatase: 57 U/L (ref 39–117)
BUN: 14 mg/dL (ref 6–23)
CO2: 28 mEq/L (ref 19–32)
Calcium: 9.6 mg/dL (ref 8.4–10.5)
Chloride: 104 mEq/L (ref 96–112)
Creatinine, Ser: 0.91 mg/dL (ref 0.40–1.20)
GFR: 72.2 mL/min (ref >60.00)
Glucose, Bld: 83 mg/dL (ref 70–99)
Potassium: 4 mEq/L (ref 3.5–5.1)
Sodium: 139 mEq/L (ref 135–145)
Total Bilirubin: 0.5 mg/dL (ref 0.2–1.2)
Total Protein: 7.2 g/dL (ref 6.0–8.3)

## 2021-06-25 LAB — VITAMIN D 25 HYDROXY (VIT D DEFICIENCY, FRACTURES): VITD: 65.9 ng/mL (ref 30.00–100.00)

## 2021-06-25 LAB — VITAMIN B12: Vitamin B-12: 404 pg/mL (ref 211–911)

## 2021-06-25 LAB — LIPID PANEL
Cholesterol: 241 mg/dL — ABNORMAL HIGH (ref 0–200)
HDL: 70.5 mg/dL (ref >39.00)
LDL Cholesterol: 157 mg/dL — ABNORMAL HIGH (ref 0–99)
NonHDL: 170.76
Total CHOL/HDL Ratio: 3
Triglycerides: 70 mg/dL (ref 0.0–149.0)
VLDL: 14 mg/dL (ref 0.0–40.0)

## 2021-06-25 LAB — HEMOGLOBIN A1C: Hgb A1c MFr Bld: 5.3 % (ref 4.6–6.5)

## 2021-06-25 LAB — TSH: TSH: 0.79 u[IU]/mL (ref 0.35–5.50)

## 2021-06-25 NOTE — Progress Notes (Signed)
Established Patient Office Visit     This visit occurred during the SARS-CoV-2 public health emergency.  Safety protocols were in place, including screening questions prior to the visit, additional usage of staff PPE, and extensive cleaning of exam room while observing appropriate contact time as indicated for disinfecting solutions.    CC/Reason for Visit: Annual preventive exam  HPI: Gwendolyn Flores is a 55 y.o. female who is coming in today for the above mentioned reasons. 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  She elected not to take anastrozole due to osteopenia.  She has been doing well and has no acute concerns or complaints.  She has routine eye and dental care.  She exercises by doing yoga 4 times a week.  She had a negative mammogram in March, she had a colonoscopy in 2020, she has her appointment with GYN on November 21.  She is due for flu, COVID booster and shingles vaccinations.   Past Medical/Surgical History: Past Medical History:  Diagnosis Date   Arthritis    Breast cancer ([place]) 2021   Cancer [facility])    thyroid cancer   Chicken pox    Family history of breast cancer    Family history of colon cancer    Family history of stomach cancer    Family history of thyroid cancer    Headache    High cholesterol    History of thyroid cancer 06/28/2013   Rectocele 06/28/2013   Thyroid disease    cancer   Urinary tract infection     Past Surgical History:  Procedure Laterality Date   EYE SURGERY  11/2019   PARTIAL MASTECTOMY WITH NEEDLE LOCALIZATION AND AXILLARY SENTINEL LYMPH NODE BX Left 02/03/2020   Procedure: PARTIAL MASTECTOMY WITH NEEDLE LOCALIZATION AND AXILLARY SENTINEL LYMPH NODE BX;  Surgeon: Virl Cagey, MD;  Location: [facility];  Service: General;  Laterality: Left;    THYROIDECTOMY     TONSILECTOMY, ADENOIDECTOMY, BILATERAL MYRINGOTOMY AND TUBES     No ear surgery    Social History:  reports that she has never smoked. She has never used smokeless tobacco. She reports current alcohol use. She reports that she does not use drugs.  Allergies: Allergies  Allergen Reactions   Codeine Nausea And Vomiting   Levaquin [Levofloxacin In D5w] Other (See Comments)    A fib; abnormal heart rhythm   Sulfa Antibiotics Nausea And Vomiting    Family History:  Family History  Problem Relation Age of Onset   Heart disease Father        heart attack   Diabetes Brother    Stomach cancer Paternal Grandfather 94   Congestive Heart Failure Paternal Grandmother    Dementia Maternal Grandmother    ALS Maternal Grandfather    Thyroid cancer Other        dx. >50 (maternal grandmother's sister)   Stroke Maternal Uncle    Breast cancer Cousin        dx. in her 5s (paternal cousin)   Hydrocephalus Paternal Uncle        "special needs"   Colon cancer Maternal Great-grandmother 7       maternal grandmother's mother   Migraines Neg Hx      Current Outpatient Medications:    Ascorbic Acid (VITAMIN C) 1000 MG tablet, Take 1,000 mg by mouth daily., Disp: , Rfl:  aspirin-acetaminophen-caffeine (EXCEDRIN MIGRAINE) 250-250-65 MG tablet, Take 1 tablet by mouth every 6 (six) hours as needed for headache. , Disp: , Rfl:    Calcium-Magnesium-Vitamin D (CALCIUM MAGNESIUM PO), Take 3 tablets by mouth daily., Disp: , Rfl:    clobetasol cream (TEMOVATE) 0.05 %, APPLY TO AFFECTED AREAS UP TO TWICE DAILY AS NEEDED (NOT TO FACE, GROIN, UNDERARMS), Disp: 60 g, Rfl: 3   Diclofenac Potassium,Migraine, 50 MG PACK, Take 50 mg by mouth once as needed. For migraine., Disp: 9 each, Rfl: 11   Galcanezumab-gnlm 120 MG/ML SOAJ, INJECT 120 MG INTO THE SKIN EVERY 30 (THIRTY) DAYS., Disp: 1 mL, Rfl: 11   ibuprofen (ADVIL,MOTRIN) 200 MG tablet, Take 400 mg by mouth every 6 (six) hours as  needed for moderate pain. , Disp: , Rfl:    levothyroxine (SYNTHROID) 100 MCG tablet, Take 100 mcg by mouth daily before breakfast. , Disp: , Rfl:    Magnesium Gluconate 550 MG TABS, Take 1 tablet twice a day, Disp: , Rfl:    naproxen (NAPROSYN) 500 MG tablet, Take with Imitrex for migraine or vertigo. (Patient taking differently: Take 500 mg by mouth daily as needed (migraine or vertigo).), Disp: 60 tablet, Rfl: 12   SUMAtriptan (IMITREX) 100 MG tablet, TAKE 1 TABLET (100 MG TOTAL) BY MOUTH EVERY 2 (TWO) HOURS AS NEEDED FOR MIGRAINE., Disp: 9 tablet, Rfl: 2   SYNTHROID 100 MCG tablet, Take 1 tablet 6 days a week and just 1/2 tablet 1 day a week, at bedtime on empty stomach for thyroid, Disp: 90 tablet, Rfl: 4   VITAMIN D PO, Take 1 tablet by mouth daily. , Disp: , Rfl:    Rimegepant Sulfate (NURTEC) 75 MG TBDP, Take 75 mg by mouth daily as needed. For migraines. Take as close to onset of migraine as possible. One daily maximum. (Patient not taking: Reported on 06/25/2021), Disp: 10 tablet, Rfl: 6  Review of Systems:  Constitutional: Denies fever, chills, diaphoresis, appetite change and fatigue.  HEENT: Denies photophobia, eye pain, redness, hearing loss, ear pain, congestion, sore throat, rhinorrhea, sneezing, mouth sores, trouble swallowing, neck pain, neck stiffness and tinnitus.   Respiratory: Denies SOB, DOE, cough, chest tightness,  and wheezing.   Cardiovascular: Denies chest pain, palpitations and leg swelling.  Gastrointestinal: Denies nausea, vomiting, abdominal pain, diarrhea, constipation, blood in stool and abdominal distention.  Genitourinary: Denies dysuria, urgency, frequency, hematuria, flank pain and difficulty urinating.  Endocrine: Denies: hot or cold intolerance, sweats, changes in hair or nails, polyuria, polydipsia. Musculoskeletal: Denies myalgias, back pain, joint swelling, arthralgias and gait problem.  Skin: Denies pallor, rash and wound.  Neurological: Denies dizziness,  seizures, syncope, weakness, light-headedness, numbness and headaches.  Hematological: Denies adenopathy. Easy bruising, personal or family bleeding history  Psychiatric/Behavioral: Denies suicidal ideation, mood changes, confusion, nervousness, sleep disturbance and agitation    Physical Exam: Vitals:   06/25/21 1109  BP: 98/64  Pulse: 89  Temp: 98 F (36.7 C)  TempSrc: Oral  SpO2: 98%  Weight: 133 lb 14.4 oz (60.7 kg)  Height: 5\' 5"  (1.651 m)    Body mass index is 22.28 kg/m.   Constitutional: NAD, calm, comfortable Eyes: PERRL, lids and conjunctivae normal ENMT: Mucous membranes are moist. Posterior pharynx clear of any exudate or lesions. Normal dentition. Tympanic membrane is pearly white, no erythema or bulging. Neck: normal, supple, no masses, no thyromegaly Respiratory: clear to auscultation bilaterally, no wheezing, no crackles. Normal respiratory effort. No accessory muscle use.  Cardiovascular: Regular rate and rhythm,  no murmurs / rubs / gallops. No extremity edema. 2+ pedal pulses. No carotid bruits.  Abdomen: no tenderness, no masses palpated. No hepatosplenomegaly. Bowel sounds positive.  Musculoskeletal: no clubbing / cyanosis. No joint deformity upper and lower extremities. Good ROM, no contractures. Normal muscle tone.  Skin: no rashes, lesions, ulcers. No induration Neurologic: CN 2-12 grossly intact. Sensation intact, DTR normal. Strength 5/5 in all 4.  Psychiatric: Normal judgment and insight. Alert and oriented x 3. Normal mood.    Impression and Plan:  Encounter for preventive health examination -She has routine eye and dental care. -She is due for flu, COVID booster and shingles vaccines, unfortunately I cannot offer them to her today as due to inclement weather our vaccines have been taken to safe storage.  She will either schedule nurse visit for this or receive them at her local pharmacy. -Labs to be updated today. -She had a bone density test in  2021 with findings of osteopenia. -Healthy lifestyle discussed in detail. -She had a negative mammogram in March 2022. -She had a colonoscopy in 2020. -She will be seeing her gynecologist in November.  Migraine without aura and without status migrainosus, not intractable  - Plan: CBC with Differential/Platelet, Comprehensive metabolic panel, Hemoglobin A1c, Lipid panel, Vitamin B12, VITAMIN D 25 Hydroxy (Vit-D Deficiency, Fractures), VITAMIN D 25 Hydroxy (Vit-D Deficiency, Fractures), Vitamin B12, Lipid panel, Hemoglobin A1c, Comprehensive metabolic panel, CBC with Differential/Platelet -Followed by neurology.  She is on Emgality and frequent Botox injections.  History of thyroid cancer Postoperative hypothyroidism  - Plan: TSH, TSH -She is currently on levothyroxine 100 mcg 6 days a week and 50 mcg 1 day a week.  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      Lelon Frohlich, MD [facility]

## 2021-06-28 DIAGNOSIS — H524 Presbyopia: Secondary | ICD-10-CM | POA: Diagnosis not present

## 2021-06-30 ENCOUNTER — Encounter: Payer: Self-pay | Admitting: Internal Medicine

## 2021-06-30 DIAGNOSIS — E785 Hyperlipidemia, unspecified: Secondary | ICD-10-CM | POA: Insufficient documentation

## 2021-06-30 NOTE — Progress Notes (Signed)
1. Cholesterol remains elevated, however her 10 year ASCVD (cardiovascular disease) risk score is 0.9% which is low. Ok to monitor off statin for now.  Rest of labs look great.

## 2021-07-05 DIAGNOSIS — M9901 Segmental and somatic dysfunction of cervical region: Secondary | ICD-10-CM | POA: Diagnosis not present

## 2021-07-05 DIAGNOSIS — M545 Low back pain, unspecified: Secondary | ICD-10-CM | POA: Diagnosis not present

## 2021-07-05 DIAGNOSIS — M542 Cervicalgia: Secondary | ICD-10-CM | POA: Diagnosis not present

## 2021-07-05 DIAGNOSIS — M546 Pain in thoracic spine: Secondary | ICD-10-CM | POA: Diagnosis not present

## 2021-07-05 DIAGNOSIS — M9902 Segmental and somatic dysfunction of thoracic region: Secondary | ICD-10-CM | POA: Diagnosis not present

## 2021-07-05 DIAGNOSIS — M9904 Segmental and somatic dysfunction of sacral region: Secondary | ICD-10-CM | POA: Diagnosis not present

## 2021-07-05 DIAGNOSIS — M9903 Segmental and somatic dysfunction of lumbar region: Secondary | ICD-10-CM | POA: Diagnosis not present

## 2021-07-05 DIAGNOSIS — M9905 Segmental and somatic dysfunction of pelvic region: Secondary | ICD-10-CM | POA: Diagnosis not present

## 2021-07-12 DIAGNOSIS — L820 Inflamed seborrheic keratosis: Secondary | ICD-10-CM | POA: Diagnosis not present

## 2021-08-02 ENCOUNTER — Other Ambulatory Visit (HOSPITAL_COMMUNITY): Payer: Self-pay

## 2021-08-09 DIAGNOSIS — M9901 Segmental and somatic dysfunction of cervical region: Secondary | ICD-10-CM | POA: Diagnosis not present

## 2021-08-09 DIAGNOSIS — M542 Cervicalgia: Secondary | ICD-10-CM | POA: Diagnosis not present

## 2021-08-09 DIAGNOSIS — M9902 Segmental and somatic dysfunction of thoracic region: Secondary | ICD-10-CM | POA: Diagnosis not present

## 2021-08-09 DIAGNOSIS — M9905 Segmental and somatic dysfunction of pelvic region: Secondary | ICD-10-CM | POA: Diagnosis not present

## 2021-08-09 DIAGNOSIS — M546 Pain in thoracic spine: Secondary | ICD-10-CM | POA: Diagnosis not present

## 2021-08-09 DIAGNOSIS — M9903 Segmental and somatic dysfunction of lumbar region: Secondary | ICD-10-CM | POA: Diagnosis not present

## 2021-08-09 DIAGNOSIS — M545 Low back pain, unspecified: Secondary | ICD-10-CM | POA: Diagnosis not present

## 2021-08-09 DIAGNOSIS — M9904 Segmental and somatic dysfunction of sacral region: Secondary | ICD-10-CM | POA: Diagnosis not present

## 2021-08-13 ENCOUNTER — Other Ambulatory Visit (HOSPITAL_COMMUNITY): Payer: Self-pay

## 2021-08-13 DIAGNOSIS — Z833 Family history of diabetes mellitus: Secondary | ICD-10-CM | POA: Diagnosis not present

## 2021-08-13 DIAGNOSIS — E7800 Pure hypercholesterolemia, unspecified: Secondary | ICD-10-CM | POA: Diagnosis not present

## 2021-08-13 DIAGNOSIS — E890 Postprocedural hypothyroidism: Secondary | ICD-10-CM | POA: Diagnosis not present

## 2021-08-13 DIAGNOSIS — Z85850 Personal history of malignant neoplasm of thyroid: Secondary | ICD-10-CM | POA: Diagnosis not present

## 2021-08-13 MED ORDER — SYNTHROID 100 MCG PO TABS: ORAL_TABLET | ORAL | 4 refills | Status: DC

## 2021-08-14 ENCOUNTER — Encounter: Payer: Self-pay | Admitting: Neurology

## 2021-08-16 ENCOUNTER — Encounter: Payer: Self-pay | Admitting: Adult Health

## 2021-08-16 ENCOUNTER — Ambulatory Visit (INDEPENDENT_AMBULATORY_CARE_PROVIDER_SITE_OTHER): Payer: 59 | Admitting: Adult Health

## 2021-08-16 ENCOUNTER — Other Ambulatory Visit: Payer: Self-pay

## 2021-08-16 VITALS — BP 95/63 | HR 83 | Ht 65.0 in | Wt 133.4 lb

## 2021-08-16 DIAGNOSIS — Z01419 Encounter for gynecological examination (general) (routine) without abnormal findings: Secondary | ICD-10-CM | POA: Diagnosis not present

## 2021-08-16 DIAGNOSIS — Z853 Personal history of malignant neoplasm of breast: Secondary | ICD-10-CM | POA: Diagnosis not present

## 2021-08-16 DIAGNOSIS — Z1211 Encounter for screening for malignant neoplasm of colon: Secondary | ICD-10-CM | POA: Diagnosis not present

## 2021-08-16 LAB — HEMOCCULT GUIAC POC 1CARD (OFFICE): Fecal Occult Blood, POC: NEGATIVE

## 2021-08-16 NOTE — Progress Notes (Signed)
Patient ID: Gwendolyn Flores, female   DOB: March 25, 1966, 55 y.o.   MRN: [id number] History of Present Illness:  Gwendolyn Flores is a 55 year old white female,married, G2P2, PM in for well woman gyn exam. Lab Results  Component Value Date   DIAGPAP  08/12/2019    - Negative for intraepithelial lesion or malignancy (NILM)   [place] Negative 08/12/2019    PCP is Dr Deniece Ree.  Current Medications, Allergies, Past Medical History, Past Surgical History, Family History and Social History were reviewed in [employer] record.     Review of Systems: Patient denies any headaches, hearing loss, fatigue, blurred vision, shortness of breath, chest pain, abdominal pain, problems with bowel movements, urination, or intercourse.(Not currently active). No joint pain or mood swings.     Physical Exam:BP 95/63 (BP Location: Right Arm, Patient Position: Sitting, Cuff Size: Normal)   Pulse 83   Ht 5\' 5"  (1.651 m)   Wt 133 lb 6.4 oz (60.5 kg)   LMP 08/12/2016 Comment: GYN - [place]  BMI 22.20 kg/m   General:  Well developed, well nourished, no acute distress Skin:  Warm and dry Neck:  Midline trachea, surgically absent thyroid, good ROM, no lymphadenopathy Lungs; Clear to auscultation bilaterally Breast:  No dominant palpable mass, retraction, or nipple discharge,well healed scar left breast Cardiovascular: Regular rate and rhythm Abdomen:  Soft, non tender, no hepatosplenomegaly Pelvic:  External genitalia is normal in appearance, no lesions.  The vagina is normal in appearance. Urethra has no lesions or masses. The cervix is bulbous.  Uterus is felt to be normal size, shape, and contour.  No adnexal masses or tenderness noted.Bladder is non tender, no masses felt. Rectal: Good sphincter tone, no polyps, or hemorrhoids felt.  Hemoccult negative.+rectocele Extremities/musculoskeletal:  No swelling or varicosities noted, no clubbing or cyanosis Psych:  No mood changes, alert and  cooperative,seems happy AA is 2 Fall risk is low Depression screen [facility] 2/9 08/16/2021 06/25/2021 08/14/2020  Decreased Interest 0 0 0  Down, Depressed, Hopeless 0 0 0  PHQ - 2 Score 0 0 0  Altered sleeping 1 0 1  Tired, decreased energy 1 0 2  Change in appetite 0 0 0  Feeling bad or failure about yourself  0 0 0  Trouble concentrating 0 0 1  Moving slowly or fidgety/restless 0 0 0  Suicidal thoughts 0 0 0  PHQ-9 Score 2 0 4  Difficult doing work/chores - Not difficult at all -    GAD 7 : Generalized Anxiety Score 08/16/2021 08/14/2020  Nervous, Anxious, on Edge 0 1  Control/stop worrying 0 0  Worry too much - different things 0 1  Trouble relaxing 0 1  Restless 0 0  Easily annoyed or irritable 0 1  Afraid - awful might happen 0 0  Total GAD 7 Score 0 4      Upstream - 08/16/21 1340       Pregnancy Intention Screening   Does the patient want to become pregnant in the next year? N/A    Does the patient's partner want to become pregnant in the next year? N/A      Contraception Wrap Up   Current Method Vasectomy   post-menopausal   End Method Vasectomy    Contraception Counseling Provided No             Examination chaperoned by [employer].  Impression and Plan: 1. Encounter for well woman exam with routine gynecological exam Pap and physical in 1  year Colonoscopy per GI Labs with PCP  2. Encounter for screening fecal occult blood testing   3. History of breast cancer Mammogram yearly

## 2021-08-26 ENCOUNTER — Ambulatory Visit: Payer: 59 | Admitting: Neurology

## 2021-09-06 DIAGNOSIS — M542 Cervicalgia: Secondary | ICD-10-CM | POA: Diagnosis not present

## 2021-09-06 DIAGNOSIS — M9905 Segmental and somatic dysfunction of pelvic region: Secondary | ICD-10-CM | POA: Diagnosis not present

## 2021-09-06 DIAGNOSIS — M9902 Segmental and somatic dysfunction of thoracic region: Secondary | ICD-10-CM | POA: Diagnosis not present

## 2021-09-06 DIAGNOSIS — M9901 Segmental and somatic dysfunction of cervical region: Secondary | ICD-10-CM | POA: Diagnosis not present

## 2021-09-06 DIAGNOSIS — M9903 Segmental and somatic dysfunction of lumbar region: Secondary | ICD-10-CM | POA: Diagnosis not present

## 2021-09-06 DIAGNOSIS — M545 Low back pain, unspecified: Secondary | ICD-10-CM | POA: Diagnosis not present

## 2021-09-06 DIAGNOSIS — M546 Pain in thoracic spine: Secondary | ICD-10-CM | POA: Diagnosis not present

## 2021-09-06 DIAGNOSIS — M9904 Segmental and somatic dysfunction of sacral region: Secondary | ICD-10-CM | POA: Diagnosis not present

## 2021-10-11 DIAGNOSIS — M9902 Segmental and somatic dysfunction of thoracic region: Secondary | ICD-10-CM | POA: Diagnosis not present

## 2021-10-11 DIAGNOSIS — M545 Low back pain, unspecified: Secondary | ICD-10-CM | POA: Diagnosis not present

## 2021-10-11 DIAGNOSIS — M546 Pain in thoracic spine: Secondary | ICD-10-CM | POA: Diagnosis not present

## 2021-10-11 DIAGNOSIS — M9901 Segmental and somatic dysfunction of cervical region: Secondary | ICD-10-CM | POA: Diagnosis not present

## 2021-10-11 DIAGNOSIS — M9904 Segmental and somatic dysfunction of sacral region: Secondary | ICD-10-CM | POA: Diagnosis not present

## 2021-10-11 DIAGNOSIS — M542 Cervicalgia: Secondary | ICD-10-CM | POA: Diagnosis not present

## 2021-10-11 DIAGNOSIS — M9905 Segmental and somatic dysfunction of pelvic region: Secondary | ICD-10-CM | POA: Diagnosis not present

## 2021-10-11 DIAGNOSIS — M9903 Segmental and somatic dysfunction of lumbar region: Secondary | ICD-10-CM | POA: Diagnosis not present

## 2021-10-18 ENCOUNTER — Other Ambulatory Visit (HOSPITAL_COMMUNITY): Payer: Self-pay

## 2021-11-02 ENCOUNTER — Ambulatory Visit: Payer: 59 | Admitting: Hematology and Oncology

## 2021-11-09 ENCOUNTER — Ambulatory Visit: Payer: 59 | Admitting: Hematology and Oncology

## 2021-11-13 NOTE — Progress Notes (Signed)
Patient Care Team: Isaac Bliss, Rayford Halsted, MD as PCP - General (Internal Medicine) Nicholas Lose, MD as Consulting Physician (Hematology and Oncology) Kyung Rudd, MD as Consulting Physician (Radiation Oncology) Virl Cagey, MD as Consulting Physician (General Surgery)  DIAGNOSIS:    ICD-10-CM   1. 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 Diagnostic mammogram showed a 0.5cm group of calcifications in the upper-outer left breast. Biopsy showed high grade DCIS, ER+ 100%, PR+ 90%.    02/03/2020 Surgery   Left lumpectomy Gwendolyn Flores) [phone]): high grade DCIS, 1.2cm, negative margins, 1 left axillary lymph node negative for carcinoma.   02/03/2020 Cancer Staging   Staging form: Breast, AJCC 8th Edition - Pathologic stage from 02/03/2020: Stage 0 (pTis (DCIS), pN0, cM0, ER+, PR+)   03/09/2020 - 04/06/2020 Radiation Therapy   The patient initially received a dose of 42.56 Gy in 16 fractions to the breast using whole-breast tangent fields. This was delivered using a 3-D conformal technique. The patient then received a boost to the seroma. This delivered an additional 8 Gy in 64factions using a 3 field photon technique due to the depth of the seroma. The total dose was 50.56 Gy.   03/27/2020 Genetic Testing   Negative genetic testing:  No pathogenic variants detected on the [facility] Common Hereditary Cancers Panel. The report date is 03/27/2020.  The Common Hereditary Cancers Panel offered by [facility] includes sequencing and/or deletion duplication testing of the following 48 genes: APC, ATM, AXIN2, BARD1, BMPR1A, BRCA1, BRCA2, BRIP1, CDH1, CDK4, CDKN2A (p14ARF), CDKN2A (p16INK4a), CHEK2, CTNNA1, DICER1, EPCAM (Deletion/duplication testing only),  GREM1 (promoter region deletion/duplication testing only), KIT, MEN1, MLH1, MSH2, MSH3, MSH6, MUTYH, NBN, NF1, NTHL1, PALB2, PDGFRA, PMS2, POLD1, POLE, PTEN, RAD50, RAD51C, RAD51D, RNF43, SDHB, SDHC, SDHD, SMAD4, SMARCA4. STK11, TP53, TSC1, TSC2, and VHL.  The following genes were evaluated for sequence changes only: SDHA and HOXB13 c.251G>A variant only.    Anti-estrogen oral therapy   Plan to begin Anastrozole x 5 years after bone density test.     CHIEF COMPLIANT: Follow-up of left breast DCIS  INTERVAL HISTORY: Gwendolyn MARTONEis a 56y.o. with above-mentioned history of left breast DCIS who underwent a left lumpectomy and completed radiation on 04/06/20. She presents to the clinic today for follow-up.   ALLERGIES:  is allergic to codeine, levaquin [levofloxacin in d5w], and sulfa antibiotics.  MEDICATIONS:  Current Outpatient Medications  Medication Sig Dispense Refill   Ascorbic Acid (VITAMIN C) 1000 MG tablet Take 1,000 mg by mouth daily.     aspirin-acetaminophen-caffeine (EXCEDRIN MIGRAINE) 250-250-65 MG tablet Take 1 tablet by mouth every 6 (six) hours as needed for headache.      Calcium-Magnesium-Vitamin D (CALCIUM MAGNESIUM PO) Take 3 tablets by mouth daily.     Diclofenac Potassium,Migraine, 50 MG PACK Take 50 mg by mouth once as needed. For migraine. 9 each 11   ibuprofen (ADVIL,MOTRIN) 200 MG tablet Take 400 mg by mouth every 6 (six) hours as needed for moderate pain.      Magnesium Gluconate 550 MG TABS Take 1 tablet twice a day     naproxen (NAPROSYN) 500 MG tablet Take with Imitrex for migraine or vertigo. (Patient taking differently: Take 500 mg by mouth daily as needed (migraine or vertigo).) 60 tablet  12   Rimegepant Sulfate (NURTEC) 75 MG TBDP Take 75 mg by mouth daily as needed. For migraines. Take as close to onset of migraine as possible. One daily maximum. (Patient not taking: Reported on 06/25/2021) 10 tablet 6   SUMAtriptan (IMITREX) 100 MG tablet TAKE 1 TABLET (100  MG TOTAL) BY MOUTH EVERY 2 (TWO) HOURS AS NEEDED FOR MIGRAINE. 9 tablet 2   SYNTHROID 100 MCG tablet Take 1 tablet 6 days a week and just 1/2 tablet 1 day a week, at bedtime on empty stomach for thyroid 90 tablet 4   VITAMIN D PO Take 1 tablet by mouth daily.      No current facility-administered medications for this visit.    PHYSICAL EXAMINATION: ECOG PERFORMANCE STATUS: 1 - Symptomatic but completely ambulatory  Vitals:   11/15/21 0815  BP: 109/70  Pulse: 80  Resp: 18  Temp: 97.7 F (36.5 C)  SpO2: 100%   Filed Weights   11/15/21 0815  Weight: 137 lb 6.4 oz (62.3 kg)    BREAST: No palpable masses or nodules in either right or left breasts. No palpable axillary supraclavicular or infraclavicular adenopathy no breast tenderness or nipple discharge. (exam performed in the presence of a chaperone)  LABORATORY DATA:  I have reviewed the data as listed CMP Latest Ref Rng & Units 06/25/2021 01/13/2020  Glucose 70 - 99 mg/dL 83 71  BUN 6 - 23 mg/dL 14 13  Creatinine 0.40 - 1.20 mg/dL 0.91 0.85  Sodium 135 - 145 mEq/L 139 139  Potassium 3.5 - 5.1 mEq/L 4.0 3.9  Chloride 96 - 112 mEq/L 104 104  CO2 19 - 32 mEq/L 28 28  Calcium 8.4 - 10.5 mg/dL 9.6 9.4  Total Protein 6.0 - 8.3 g/dL 7.2 6.5  Total Bilirubin 0.2 - 1.2 mg/dL 0.5 0.5  Alkaline Phos 39 - 117 U/L 57 62  AST 0 - 37 U/L 21 18  ALT 0 - 35 U/L 21 18    Lab Results  Component Value Date   WBC 4.2 06/25/2021   HGB 14.0 06/25/2021   HCT 41.6 06/25/2021   MCV 96.4 06/25/2021   PLT 238.0 06/25/2021   NEUTROABS 2.0 06/25/2021    ASSESSMENT & PLAN:  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 Diagnostic mammogram showed a 0.5cm group of calcifications in the upper-outer left breast. Biopsy showed high grade DCIS, ER+ 100%, PR+ 90%.  02/03/2020: Left lumpectomy: High-grade DCIS 1.2 cm, 1 axillary lymph node negative, margins negative    Adjuvant radiation 03/10/2020-04/06/2020   Treatment plan: Adjuvant antiestrogen therapy with anastrozole 1 mg daily x5 years but she has not started it because of concerns for adverse effects.  She has postmenopausal symptoms as well as osteopenia with a T score of -1.5 and elevated cholesterol numbers.  Because of her history of coronary artery disease we decided to use anastrozole over tamoxifen   I sent her 30 days of anastrozole if she wants to try it after her next lab check in the spring.  Breast cancer surveillance: 1.  Breast exam 11/15/2021: Benign 2. mammogram 12/21/2020: Benign breast density category C I will order for another mammogram to be done end of March. 3.  Bone density 05/18/2020: T score -1.5: Osteopenia: Calcium and vitamin D  Patient's husband Dr. Aviva Signs is our surgeon at [facility].  Follow-up in 6 months with a telephone visit. Return to clinic in 1 year for follow-up  No orders of the defined types were placed in this encounter.  The patient has a good understanding of the overall plan. she agrees with it. she will call with any problems that may develop before the next visit here.  Total time spent: 20 mins including face to face time and time spent for planning, charting and coordination of care  Rulon Eisenmenger, MD, MPH 11/15/2021  I, Thana Ates, am acting as scribe for Dr. Nicholas Lose.  I have reviewed the above documentation for accuracy and completeness, and I agree with the above.

## 2021-11-15 ENCOUNTER — Other Ambulatory Visit (HOSPITAL_COMMUNITY): Payer: Self-pay

## 2021-11-15 ENCOUNTER — Telehealth: Payer: Self-pay | Admitting: Internal Medicine

## 2021-11-15 ENCOUNTER — Inpatient Hospital Stay: Payer: 59 | Attending: Hematology and Oncology | Admitting: Hematology and Oncology

## 2021-11-15 ENCOUNTER — Other Ambulatory Visit: Payer: Self-pay

## 2021-11-15 DIAGNOSIS — D0512 Intraductal carcinoma in situ of left breast: Secondary | ICD-10-CM | POA: Insufficient documentation

## 2021-11-15 DIAGNOSIS — Z79811 Long term (current) use of aromatase inhibitors: Secondary | ICD-10-CM | POA: Insufficient documentation

## 2021-11-15 DIAGNOSIS — M9904 Segmental and somatic dysfunction of sacral region: Secondary | ICD-10-CM | POA: Diagnosis not present

## 2021-11-15 DIAGNOSIS — M546 Pain in thoracic spine: Secondary | ICD-10-CM | POA: Diagnosis not present

## 2021-11-15 DIAGNOSIS — Z923 Personal history of irradiation: Secondary | ICD-10-CM | POA: Diagnosis not present

## 2021-11-15 DIAGNOSIS — M9905 Segmental and somatic dysfunction of pelvic region: Secondary | ICD-10-CM | POA: Diagnosis not present

## 2021-11-15 DIAGNOSIS — M9901 Segmental and somatic dysfunction of cervical region: Secondary | ICD-10-CM | POA: Diagnosis not present

## 2021-11-15 DIAGNOSIS — M9903 Segmental and somatic dysfunction of lumbar region: Secondary | ICD-10-CM | POA: Diagnosis not present

## 2021-11-15 DIAGNOSIS — H9190 Unspecified hearing loss, unspecified ear: Secondary | ICD-10-CM

## 2021-11-15 DIAGNOSIS — M9902 Segmental and somatic dysfunction of thoracic region: Secondary | ICD-10-CM | POA: Diagnosis not present

## 2021-11-15 DIAGNOSIS — M542 Cervicalgia: Secondary | ICD-10-CM | POA: Diagnosis not present

## 2021-11-15 DIAGNOSIS — M8580 Other specified disorders of bone density and structure, unspecified site: Secondary | ICD-10-CM | POA: Insufficient documentation

## 2021-11-15 DIAGNOSIS — M545 Low back pain, unspecified: Secondary | ICD-10-CM | POA: Diagnosis not present

## 2021-11-15 MED ORDER — ANASTROZOLE 1 MG PO TABS
1.0000 mg | ORAL_TABLET | Freq: Every day | ORAL | 0 refills | Status: DC
Start: 2021-11-15 — End: 2022-11-14
  Filled 2021-11-15: qty 30, 30d supply, fill #0

## 2021-11-15 NOTE — Assessment & Plan Note (Signed)
01/29/2020:Screening mammogram showed left breast calcifications. Diagnostic mammogram showed a 0.5cm group of calcifications in the upper-outer left breast. Biopsy showed high grade DCIS, ER+ 100%, PR+ 90%. 02/03/2020: Left lumpectomy: High-grade DCIS 1.2 cm, 1 axillary lymph node negative, margins negative  Adjuvant radiation 03/10/2020-04/06/2020  Treatment plan: Adjuvant antiestrogen therapy with anastrozole 1 mg daily x5 years Because of her history of coronary artery disease we decided to use anastrozole over tamoxifen   Anastrozole toxicities:  Breast cancer surveillance: 1.  Breast exam 11/15/2021: Benign 2. mammogram 12/21/2020: Benign breast density category C 3.  Bone density 05/18/2020: T score -1.5: Osteopenia: Calcium and vitamin D  Patient's husbandDr.Mark Conry is oursurgeon at [facility].  Return to clinic in 1 year for follow-up

## 2021-11-15 NOTE — Telephone Encounter (Signed)
Okay to place the referral?

## 2021-11-15 NOTE — Telephone Encounter (Signed)
Patient called in to request a referral to the audiologist. Patient already have an appointment with them but all they need is a referral.  The name of the facility is [facility].  The fax number is [phone].  The phone number is [phone]  Patient could be contacted at [phone].  Please advise.

## 2021-11-16 NOTE — Telephone Encounter (Signed)
Referral placed.

## 2021-12-10 DIAGNOSIS — M542 Cervicalgia: Secondary | ICD-10-CM | POA: Diagnosis not present

## 2021-12-10 DIAGNOSIS — M9903 Segmental and somatic dysfunction of lumbar region: Secondary | ICD-10-CM | POA: Diagnosis not present

## 2021-12-10 DIAGNOSIS — M9902 Segmental and somatic dysfunction of thoracic region: Secondary | ICD-10-CM | POA: Diagnosis not present

## 2021-12-10 DIAGNOSIS — M546 Pain in thoracic spine: Secondary | ICD-10-CM | POA: Diagnosis not present

## 2021-12-10 DIAGNOSIS — M9905 Segmental and somatic dysfunction of pelvic region: Secondary | ICD-10-CM | POA: Diagnosis not present

## 2021-12-10 DIAGNOSIS — M545 Low back pain, unspecified: Secondary | ICD-10-CM | POA: Diagnosis not present

## 2021-12-10 DIAGNOSIS — M9901 Segmental and somatic dysfunction of cervical region: Secondary | ICD-10-CM | POA: Diagnosis not present

## 2021-12-10 DIAGNOSIS — M9904 Segmental and somatic dysfunction of sacral region: Secondary | ICD-10-CM | POA: Diagnosis not present

## 2021-12-20 ENCOUNTER — Other Ambulatory Visit (HOSPITAL_COMMUNITY): Payer: Self-pay

## 2021-12-20 ENCOUNTER — Telehealth: Payer: Self-pay | Admitting: Internal Medicine

## 2021-12-20 ENCOUNTER — Other Ambulatory Visit: Payer: Self-pay | Admitting: Neurology

## 2021-12-20 ENCOUNTER — Other Ambulatory Visit: Payer: Self-pay | Admitting: Internal Medicine

## 2021-12-20 MED ORDER — SUMATRIPTAN SUCCINATE 100 MG PO TABS
ORAL_TABLET | ORAL | 0 refills | Status: DC
  Filled 2021-12-20: qty 9, 30d supply, fill #0

## 2021-12-20 MED ORDER — SUMATRIPTAN SUCCINATE 100 MG PO TABS
100.0000 mg | ORAL_TABLET | ORAL | 11 refills | Status: DC | PRN
Start: 2021-12-20 — End: 2022-11-03
  Filled 2021-12-20: qty 9, fill #0
  Filled 2022-10-18: qty 9, 1d supply, fill #0
  Filled 2022-10-25: qty 9, 30d supply, fill #0

## 2021-12-20 NOTE — Telephone Encounter (Signed)
Pt has cpe sch for 06-27-22 and would like a refill on SUMAtriptan (IMITREX) 100 MG tablet send to  ?[facility] Phone:  [phone]  ?Fax:  [phone]  ?  ? ?

## 2021-12-20 NOTE — Telephone Encounter (Signed)
Refill sent.  Patient must keep appointment for further refills. ?

## 2021-12-21 ENCOUNTER — Other Ambulatory Visit (HOSPITAL_COMMUNITY): Payer: Self-pay

## 2021-12-24 ENCOUNTER — Ambulatory Visit
Admission: RE | Admit: 2021-12-24 | Discharge: 2021-12-24 | Disposition: A | Discharge status: Home / Self Care | Payer: 59 | Source: Ambulatory Visit | Attending: Hematology and Oncology | Admitting: Hematology and Oncology

## 2021-12-24 DIAGNOSIS — R922 Inconclusive mammogram: Secondary | ICD-10-CM | POA: Diagnosis not present

## 2021-12-24 DIAGNOSIS — D0512 Intraductal carcinoma in situ of left breast: Secondary | ICD-10-CM

## 2021-12-24 DIAGNOSIS — Z853 Personal history of malignant neoplasm of breast: Secondary | ICD-10-CM | POA: Diagnosis not present

## 2022-01-07 DIAGNOSIS — H903 Sensorineural hearing loss, bilateral: Secondary | ICD-10-CM | POA: Diagnosis not present

## 2022-01-10 DIAGNOSIS — M9902 Segmental and somatic dysfunction of thoracic region: Secondary | ICD-10-CM | POA: Diagnosis not present

## 2022-01-10 DIAGNOSIS — M546 Pain in thoracic spine: Secondary | ICD-10-CM | POA: Diagnosis not present

## 2022-01-10 DIAGNOSIS — M9901 Segmental and somatic dysfunction of cervical region: Secondary | ICD-10-CM | POA: Diagnosis not present

## 2022-01-10 DIAGNOSIS — M9905 Segmental and somatic dysfunction of pelvic region: Secondary | ICD-10-CM | POA: Diagnosis not present

## 2022-01-10 DIAGNOSIS — M9904 Segmental and somatic dysfunction of sacral region: Secondary | ICD-10-CM | POA: Diagnosis not present

## 2022-01-10 DIAGNOSIS — M9903 Segmental and somatic dysfunction of lumbar region: Secondary | ICD-10-CM | POA: Diagnosis not present

## 2022-01-10 DIAGNOSIS — M542 Cervicalgia: Secondary | ICD-10-CM | POA: Diagnosis not present

## 2022-01-10 DIAGNOSIS — M545 Low back pain, unspecified: Secondary | ICD-10-CM | POA: Diagnosis not present

## 2022-01-17 ENCOUNTER — Other Ambulatory Visit (HOSPITAL_COMMUNITY): Payer: Self-pay

## 2022-02-07 DIAGNOSIS — M9905 Segmental and somatic dysfunction of pelvic region: Secondary | ICD-10-CM | POA: Diagnosis not present

## 2022-02-07 DIAGNOSIS — C73 Malignant neoplasm of thyroid gland: Secondary | ICD-10-CM | POA: Diagnosis not present

## 2022-02-07 DIAGNOSIS — M542 Cervicalgia: Secondary | ICD-10-CM | POA: Diagnosis not present

## 2022-02-07 DIAGNOSIS — E890 Postprocedural hypothyroidism: Secondary | ICD-10-CM | POA: Diagnosis not present

## 2022-02-07 DIAGNOSIS — M9904 Segmental and somatic dysfunction of sacral region: Secondary | ICD-10-CM | POA: Diagnosis not present

## 2022-02-07 DIAGNOSIS — M9902 Segmental and somatic dysfunction of thoracic region: Secondary | ICD-10-CM | POA: Diagnosis not present

## 2022-02-07 DIAGNOSIS — M545 Low back pain, unspecified: Secondary | ICD-10-CM | POA: Diagnosis not present

## 2022-02-07 DIAGNOSIS — M9903 Segmental and somatic dysfunction of lumbar region: Secondary | ICD-10-CM | POA: Diagnosis not present

## 2022-02-07 DIAGNOSIS — M9901 Segmental and somatic dysfunction of cervical region: Secondary | ICD-10-CM | POA: Diagnosis not present

## 2022-02-07 DIAGNOSIS — M546 Pain in thoracic spine: Secondary | ICD-10-CM | POA: Diagnosis not present

## 2022-02-10 ENCOUNTER — Telehealth: Payer: Self-pay | Admitting: Hematology and Oncology

## 2022-02-10 NOTE — Telephone Encounter (Signed)
.  Called patient to schedule appointment per 5/18 inbasket, patient is aware of date and time.   

## 2022-02-14 ENCOUNTER — Inpatient Hospital Stay: Payer: 59 | Admitting: Hematology and Oncology

## 2022-02-18 ENCOUNTER — Other Ambulatory Visit (HOSPITAL_COMMUNITY): Payer: Self-pay

## 2022-02-18 DIAGNOSIS — Z8249 Family history of ischemic heart disease and other diseases of the circulatory system: Secondary | ICD-10-CM | POA: Diagnosis not present

## 2022-02-18 DIAGNOSIS — E890 Postprocedural hypothyroidism: Secondary | ICD-10-CM | POA: Diagnosis not present

## 2022-02-18 DIAGNOSIS — Z85850 Personal history of malignant neoplasm of thyroid: Secondary | ICD-10-CM | POA: Diagnosis not present

## 2022-02-18 DIAGNOSIS — Z79890 Hormone replacement therapy (postmenopausal): Secondary | ICD-10-CM | POA: Diagnosis not present

## 2022-02-18 MED ORDER — SYNTHROID 100 MCG PO TABS
100.0000 ug | ORAL_TABLET | ORAL | 4 refills | Status: AC
  Filled 2022-02-18 – 2022-04-12 (×2): qty 72, 84d supply, fill #0
  Filled 2022-06-30: qty 72, 84d supply, fill #1
  Filled 2022-09-23 – 2022-09-29 (×3): qty 72, 84d supply, fill #2
  Filled 2022-12-08: qty 72, 84d supply, fill #3

## 2022-03-07 DIAGNOSIS — M9905 Segmental and somatic dysfunction of pelvic region: Secondary | ICD-10-CM | POA: Diagnosis not present

## 2022-03-07 DIAGNOSIS — M9902 Segmental and somatic dysfunction of thoracic region: Secondary | ICD-10-CM | POA: Diagnosis not present

## 2022-03-07 DIAGNOSIS — M545 Low back pain, unspecified: Secondary | ICD-10-CM | POA: Diagnosis not present

## 2022-03-07 DIAGNOSIS — M9903 Segmental and somatic dysfunction of lumbar region: Secondary | ICD-10-CM | POA: Diagnosis not present

## 2022-03-07 DIAGNOSIS — M9901 Segmental and somatic dysfunction of cervical region: Secondary | ICD-10-CM | POA: Diagnosis not present

## 2022-03-07 DIAGNOSIS — M542 Cervicalgia: Secondary | ICD-10-CM | POA: Diagnosis not present

## 2022-03-07 DIAGNOSIS — M546 Pain in thoracic spine: Secondary | ICD-10-CM | POA: Diagnosis not present

## 2022-03-07 DIAGNOSIS — M9904 Segmental and somatic dysfunction of sacral region: Secondary | ICD-10-CM | POA: Diagnosis not present

## 2022-03-22 ENCOUNTER — Ambulatory Visit: Payer: 59 | Admitting: Hematology and Oncology

## 2022-03-28 ENCOUNTER — Other Ambulatory Visit (HOSPITAL_COMMUNITY): Payer: Self-pay

## 2022-03-28 ENCOUNTER — Other Ambulatory Visit: Payer: Self-pay | Admitting: Neurology

## 2022-03-31 ENCOUNTER — Telehealth: Payer: Self-pay | Admitting: *Deleted

## 2022-03-31 ENCOUNTER — Other Ambulatory Visit (HOSPITAL_COMMUNITY): Payer: Self-pay

## 2022-03-31 MED ORDER — EMGALITY 120 MG/ML ~~LOC~~ SOAJ
1.0000 mL | SUBCUTANEOUS | 0 refills | Status: DC
  Filled 2022-03-31: qty 1, 28d supply, fill #0
  Filled 2022-04-05 – 2022-04-12 (×2): qty 1, 30d supply, fill #0

## 2022-03-31 NOTE — Telephone Encounter (Signed)
Emgality PA, Key: [id number], [id number]. Your information has been sent to [facility].

## 2022-04-04 DIAGNOSIS — M542 Cervicalgia: Secondary | ICD-10-CM | POA: Diagnosis not present

## 2022-04-04 DIAGNOSIS — M9904 Segmental and somatic dysfunction of sacral region: Secondary | ICD-10-CM | POA: Diagnosis not present

## 2022-04-04 DIAGNOSIS — M545 Low back pain, unspecified: Secondary | ICD-10-CM | POA: Diagnosis not present

## 2022-04-04 DIAGNOSIS — M9901 Segmental and somatic dysfunction of cervical region: Secondary | ICD-10-CM | POA: Diagnosis not present

## 2022-04-04 DIAGNOSIS — M9903 Segmental and somatic dysfunction of lumbar region: Secondary | ICD-10-CM | POA: Diagnosis not present

## 2022-04-04 DIAGNOSIS — M9905 Segmental and somatic dysfunction of pelvic region: Secondary | ICD-10-CM | POA: Diagnosis not present

## 2022-04-04 DIAGNOSIS — M9902 Segmental and somatic dysfunction of thoracic region: Secondary | ICD-10-CM | POA: Diagnosis not present

## 2022-04-04 DIAGNOSIS — M546 Pain in thoracic spine: Secondary | ICD-10-CM | POA: Diagnosis not present

## 2022-04-05 ENCOUNTER — Other Ambulatory Visit (HOSPITAL_COMMUNITY): Payer: Self-pay

## 2022-04-05 NOTE — Telephone Encounter (Signed)
Called [facility] to check status of Emgality PA,  spoke with Colletta Maryland who stated a set of quesiotns was sent over. I then noted that the Rx Dr Jaynee Eagles sent on 03/31/22 is for Emgality 120 mg/mL. The PA [facility] pharmacy sent over was for Emgality 100 mg/mL. Also noted that the patient hasn't been seen since 11/2020 and no follow up is scheduled. Will not complete PA at this time.

## 2022-04-05 NOTE — Telephone Encounter (Signed)
Received additional information on this request. The request was confirming if the request should be for the emgality 120 mg which is already on file as approved.  I confirmed this was the correct dosage for the pt.   Pt was advised on the refill given to make f/u appt for further refills.

## 2022-04-12 ENCOUNTER — Other Ambulatory Visit (HOSPITAL_COMMUNITY): Payer: Self-pay

## 2022-04-12 NOTE — Telephone Encounter (Signed)
Received fax, approval for Emgality '120mg'$ / ml PA REF # [id number]  from 04-06-2022 to 04-07-2023. [employer].  [phone].

## 2022-05-16 ENCOUNTER — Telehealth: Payer: 59 | Admitting: Hematology and Oncology

## 2022-05-16 DIAGNOSIS — M9905 Segmental and somatic dysfunction of pelvic region: Secondary | ICD-10-CM | POA: Diagnosis not present

## 2022-05-16 DIAGNOSIS — M545 Low back pain, unspecified: Secondary | ICD-10-CM | POA: Diagnosis not present

## 2022-05-16 DIAGNOSIS — M9903 Segmental and somatic dysfunction of lumbar region: Secondary | ICD-10-CM | POA: Diagnosis not present

## 2022-05-16 DIAGNOSIS — M546 Pain in thoracic spine: Secondary | ICD-10-CM | POA: Diagnosis not present

## 2022-05-16 DIAGNOSIS — M9901 Segmental and somatic dysfunction of cervical region: Secondary | ICD-10-CM | POA: Diagnosis not present

## 2022-05-16 DIAGNOSIS — M9904 Segmental and somatic dysfunction of sacral region: Secondary | ICD-10-CM | POA: Diagnosis not present

## 2022-05-16 DIAGNOSIS — M9902 Segmental and somatic dysfunction of thoracic region: Secondary | ICD-10-CM | POA: Diagnosis not present

## 2022-05-16 DIAGNOSIS — M542 Cervicalgia: Secondary | ICD-10-CM | POA: Diagnosis not present

## 2022-05-20 DIAGNOSIS — H903 Sensorineural hearing loss, bilateral: Secondary | ICD-10-CM | POA: Diagnosis not present

## 2022-05-23 DIAGNOSIS — C73 Malignant neoplasm of thyroid gland: Secondary | ICD-10-CM | POA: Diagnosis not present

## 2022-05-23 DIAGNOSIS — E890 Postprocedural hypothyroidism: Secondary | ICD-10-CM | POA: Diagnosis not present

## 2022-05-31 ENCOUNTER — Other Ambulatory Visit (HOSPITAL_COMMUNITY): Payer: Self-pay

## 2022-05-31 ENCOUNTER — Other Ambulatory Visit: Payer: Self-pay | Admitting: Neurology

## 2022-06-01 ENCOUNTER — Other Ambulatory Visit (HOSPITAL_COMMUNITY): Payer: Self-pay

## 2022-06-01 MED ORDER — EMGALITY 120 MG/ML ~~LOC~~ SOAJ
1.0000 mL | SUBCUTANEOUS | 0 refills | Status: DC
  Filled 2022-06-01: qty 1, 30d supply, fill #0

## 2022-06-02 ENCOUNTER — Other Ambulatory Visit (HOSPITAL_COMMUNITY): Payer: Self-pay

## 2022-06-10 ENCOUNTER — Other Ambulatory Visit: Payer: Self-pay | Admitting: Neurology

## 2022-06-13 ENCOUNTER — Telehealth: Payer: 59 | Admitting: Neurology

## 2022-06-16 ENCOUNTER — Other Ambulatory Visit (HOSPITAL_COMMUNITY): Payer: Self-pay

## 2022-06-16 MED ORDER — EMGALITY 120 MG/ML ~~LOC~~ SOAJ
1.0000 mL | SUBCUTANEOUS | 3 refills | Status: DC
  Filled 2022-06-16 – 2022-06-30 (×2): qty 1, 30d supply, fill #0
  Filled 2022-07-29: qty 1, 30d supply, fill #1
  Filled 2022-08-27: qty 1, 30d supply, fill #2
  Filled 2022-09-23: qty 1, 30d supply, fill #3

## 2022-06-20 DIAGNOSIS — M542 Cervicalgia: Secondary | ICD-10-CM | POA: Diagnosis not present

## 2022-06-20 DIAGNOSIS — M9901 Segmental and somatic dysfunction of cervical region: Secondary | ICD-10-CM | POA: Diagnosis not present

## 2022-06-20 DIAGNOSIS — M9902 Segmental and somatic dysfunction of thoracic region: Secondary | ICD-10-CM | POA: Diagnosis not present

## 2022-06-20 DIAGNOSIS — M545 Low back pain, unspecified: Secondary | ICD-10-CM | POA: Diagnosis not present

## 2022-06-20 DIAGNOSIS — M9904 Segmental and somatic dysfunction of sacral region: Secondary | ICD-10-CM | POA: Diagnosis not present

## 2022-06-20 DIAGNOSIS — M546 Pain in thoracic spine: Secondary | ICD-10-CM | POA: Diagnosis not present

## 2022-06-20 DIAGNOSIS — M9903 Segmental and somatic dysfunction of lumbar region: Secondary | ICD-10-CM | POA: Diagnosis not present

## 2022-06-20 DIAGNOSIS — M9905 Segmental and somatic dysfunction of pelvic region: Secondary | ICD-10-CM | POA: Diagnosis not present

## 2022-06-27 ENCOUNTER — Ambulatory Visit (INDEPENDENT_AMBULATORY_CARE_PROVIDER_SITE_OTHER): Payer: 59 | Admitting: Internal Medicine

## 2022-06-27 ENCOUNTER — Encounter: Payer: Self-pay | Admitting: Internal Medicine

## 2022-06-27 VITALS — BP 108/78 | HR 73 | Temp 98.3°F | Resp 18 | Ht 65.0 in | Wt 137.2 lb

## 2022-06-27 DIAGNOSIS — Z853 Personal history of malignant neoplasm of breast: Secondary | ICD-10-CM | POA: Diagnosis not present

## 2022-06-27 DIAGNOSIS — Z0000 Encounter for general adult medical examination without abnormal findings: Secondary | ICD-10-CM

## 2022-06-27 DIAGNOSIS — E782 Mixed hyperlipidemia: Secondary | ICD-10-CM | POA: Diagnosis not present

## 2022-06-27 LAB — CBC WITH DIFFERENTIAL/PLATELET
Basophils Absolute: 0 10*3/uL (ref 0.0–0.1)
Basophils Relative: 0.7 % (ref 0.0–3.0)
Eosinophils Absolute: 0.1 10*3/uL (ref 0.0–0.7)
Eosinophils Relative: 2.2 % (ref 0.0–5.0)
HCT: 40.1 % (ref 36.0–46.0)
Hemoglobin: 13.6 g/dL (ref 12.0–15.0)
Lymphocytes Relative: 41.6 % (ref 12.0–46.0)
Lymphs Abs: 1.6 10*3/uL (ref 0.7–4.0)
MCHC: 33.9 g/dL (ref 30.0–36.0)
MCV: 96.3 fl (ref 78.0–100.0)
Monocytes Absolute: 0.5 10*3/uL (ref 0.1–1.0)
Monocytes Relative: 12.2 % — ABNORMAL HIGH (ref 3.0–12.0)
Neutro Abs: 1.6 10*3/uL (ref 1.4–7.7)
Neutrophils Relative %: 43.3 % (ref 43.0–77.0)
Platelets: 204 10*3/uL (ref 150.0–400.0)
RBC: 4.16 Mil/uL (ref 3.87–5.11)
RDW: 13 % (ref 11.5–15.5)
WBC: 3.8 10*3/uL — ABNORMAL LOW (ref 4.0–10.5)

## 2022-06-27 LAB — COMPREHENSIVE METABOLIC PANEL
ALT: 33 U/L (ref 0–35)
AST: 34 U/L (ref 0–37)
Albumin: 4.2 g/dL (ref 3.5–5.2)
Alkaline Phosphatase: 61 U/L (ref 39–117)
BUN: 20 mg/dL (ref 6–23)
CO2: 27 mEq/L (ref 19–32)
Calcium: 9.4 mg/dL (ref 8.4–10.5)
Chloride: 104 mEq/L (ref 96–112)
Creatinine, Ser: 0.97 mg/dL (ref 0.40–1.20)
GFR: 66.41 mL/min (ref >60.00)
Glucose, Bld: 76 mg/dL (ref 70–99)
Potassium: 3.7 mEq/L (ref 3.5–5.1)
Sodium: 138 mEq/L (ref 135–145)
Total Bilirubin: 0.4 mg/dL (ref 0.2–1.2)
Total Protein: 7 g/dL (ref 6.0–8.3)

## 2022-06-27 LAB — LIPID PANEL
Cholesterol: 219 mg/dL — ABNORMAL HIGH (ref 0–200)
HDL: 65.1 mg/dL (ref >39.00)
LDL Cholesterol: 137 mg/dL — ABNORMAL HIGH (ref 0–99)
NonHDL: 153.61
Total CHOL/HDL Ratio: 3
Triglycerides: 82 mg/dL (ref 0.0–149.0)
VLDL: 16.4 mg/dL (ref 0.0–40.0)

## 2022-06-27 LAB — HEMOGLOBIN A1C: Hgb A1c MFr Bld: 5.5 % (ref 4.6–6.5)

## 2022-06-27 LAB — TSH: TSH: 4.39 u[IU]/mL (ref 0.35–5.50)

## 2022-06-27 LAB — VITAMIN D 25 HYDROXY (VIT D DEFICIENCY, FRACTURES): VITD: 49.39 ng/mL (ref 30.00–100.00)

## 2022-06-27 LAB — VITAMIN B12: Vitamin B-12: 349 pg/mL (ref 211–911)

## 2022-06-27 NOTE — Progress Notes (Signed)
Established Patient Office Visit     CC/Reason for Visit: Annual preventive exam  HPI: Gwendolyn Flores is a 56 y.o. female who is coming in today for the above mentioned reasons. Past Medical History is significant for: Ductal carcinoma in situ that was treated last year with lumpectomy and radiation.  She just had flu and COVID vaccines last week.  She has no acute concerns or complaints.  She has routine eye and dental care, she exercises routinely.   Past Medical/Surgical History: Past Medical History:  Diagnosis Date   Arthritis    Breast cancer ([place]) 2021   Cancer [facility])    thyroid cancer   Chicken pox    Family history of breast cancer    Family history of colon cancer    Family history of stomach cancer    Family history of thyroid cancer    Headache    High cholesterol    History of thyroid cancer 06/28/2013   Rectocele 06/28/2013   Thyroid disease    cancer   Urinary tract infection     Past Surgical History:  Procedure Laterality Date   EYE SURGERY  11/2019   PARTIAL MASTECTOMY WITH NEEDLE LOCALIZATION AND AXILLARY SENTINEL LYMPH NODE BX Left 02/03/2020   Procedure: PARTIAL MASTECTOMY WITH NEEDLE LOCALIZATION AND AXILLARY SENTINEL LYMPH NODE BX;  Surgeon: Virl Cagey, MD;  Location: [facility];  Service: General;  Laterality: Left;   THYROIDECTOMY     TONSILECTOMY, ADENOIDECTOMY, BILATERAL MYRINGOTOMY AND TUBES     No ear surgery    Social History:  reports that she has never smoked. She has never used smokeless tobacco. She reports current alcohol use. She reports that she does not use drugs.  Allergies: Allergies  Allergen Reactions   Codeine Nausea And Vomiting   Levaquin [Levofloxacin In D5w] Other (See Comments)    A fib; abnormal heart rhythm   Sulfa Antibiotics Nausea And Vomiting    Family History:  Family History  Problem Relation Age of Onset   Heart disease Father        heart attack   Diabetes Brother    Stomach cancer Paternal  Grandfather 30   Congestive Heart Failure Paternal Grandmother    Dementia Maternal Grandmother    ALS Maternal Grandfather    Thyroid cancer Other        dx. >50 (maternal grandmother's sister)   Stroke Maternal Uncle    Breast cancer Cousin        dx. in her 68s (paternal cousin)   Hydrocephalus Paternal Uncle        "special needs"   Colon cancer Maternal Great-grandmother 46       maternal grandmother's mother   Migraines Neg Hx      Current Outpatient Medications:    Ascorbic Acid (VITAMIN C) 1000 MG tablet, Take 1,000 mg by mouth daily., Disp: , Rfl:    aspirin-acetaminophen-caffeine (EXCEDRIN MIGRAINE) 250-250-65 MG tablet, Take 1 tablet by mouth every 6 (six) hours as needed for headache. , Disp: , Rfl:    Calcium-Magnesium-Vitamin D (CALCIUM MAGNESIUM PO), Take 3 tablets by mouth daily., Disp: , Rfl:    Diclofenac Potassium,Migraine, 50 MG PACK, Take 50 mg by mouth once as needed. For migraine., Disp: 9 each, Rfl: 11   Galcanezumab-gnlm (EMGALITY) 120 MG/ML SOAJ, Inject 1 mL into the skin every 30 (thirty) days., Disp: 1 mL, Rfl: 3   ibuprofen (ADVIL,MOTRIN) 200 MG tablet, Take 400 mg by mouth every 6 (  six) hours as needed for moderate pain. , Disp: , Rfl:    naproxen (NAPROSYN) 500 MG tablet, Take with Imitrex for migraine or vertigo. (Patient taking differently: Take 500 mg by mouth daily as needed (migraine or vertigo).), Disp: 60 tablet, Rfl: 12   SUMAtriptan (IMITREX) 100 MG tablet, TAKE 1 TABLET (100 MG TOTAL) BY MOUTH EVERY 2 (TWO) HOURS AS NEEDED FOR MIGRAINE., Disp: 9 tablet, Rfl: 11   SYNTHROID 100 MCG tablet, Take 1 tablet 5 days a week and just 1/2 tablet on Saturday and Sunday at bedtime on empty stomach for thyroid, Disp: 72 tablet, Rfl: 4   VITAMIN D PO, Take 1 tablet by mouth daily. , Disp: , Rfl:    anastrozole (ARIMIDEX) 1 MG tablet, Take 1 tablet (1 mg total) by mouth daily. (Patient not taking: Reported on 06/27/2022), Disp: 30 tablet, Rfl: 0   Magnesium  Gluconate 550 MG TABS, Take 1 tablet twice a day, Disp: , Rfl:    Rimegepant Sulfate (NURTEC) 75 MG TBDP, Take 75 mg by mouth daily as needed. For migraines. Take as close to onset of migraine as possible. One daily maximum., Disp: 10 tablet, Rfl: 6  Review of Systems:  Constitutional: Denies fever, chills, diaphoresis, appetite change and fatigue.  HEENT: Denies photophobia, eye pain, redness, hearing loss, ear pain, congestion, sore throat, rhinorrhea, sneezing, mouth sores, trouble swallowing, neck pain, neck stiffness and tinnitus.   Respiratory: Denies SOB, DOE, cough, chest tightness,  and wheezing.   Cardiovascular: Denies chest pain, palpitations and leg swelling.  Gastrointestinal: Denies nausea, vomiting, abdominal pain, diarrhea, constipation, blood in stool and abdominal distention.  Genitourinary: Denies dysuria, urgency, frequency, hematuria, flank pain and difficulty urinating.  Endocrine: Denies: hot or cold intolerance, sweats, changes in hair or nails, polyuria, polydipsia. Musculoskeletal: Denies myalgias, back pain, joint swelling, arthralgias and gait problem.  Skin: Denies pallor, rash and wound.  Neurological: Denies dizziness, seizures, syncope, weakness, light-headedness, numbness and headaches.  Hematological: Denies adenopathy. Easy bruising, personal or family bleeding history  Psychiatric/Behavioral: Denies suicidal ideation, mood changes, confusion, nervousness, sleep disturbance and agitation    Physical Exam: Vitals:   06/27/22 0900  BP: 108/78  Pulse: 73  Resp: 18  Temp: 98.3 F (36.8 C)  TempSrc: Oral  SpO2: 98%  Weight: 137 lb 3 oz (62.2 kg)  Height: '5\' 5"'$  (1.651 m)    Body mass index is 22.83 kg/m.   Constitutional: NAD, calm, comfortable Eyes: PERRL, lids and conjunctivae normal ENMT: Mucous membranes are moist. Posterior pharynx clear of any exudate or lesions. Normal dentition. Tympanic membrane is pearly white, no erythema or  bulging. Neck: normal, supple, no masses, no thyromegaly Respiratory: clear to auscultation bilaterally, no wheezing, no crackles. Normal respiratory effort. No accessory muscle use.  Cardiovascular: Regular rate and rhythm, no murmurs / rubs / gallops. No extremity edema. 2+ pedal pulses. No carotid bruits.  Abdomen: no tenderness, no masses palpated. No hepatosplenomegaly. Bowel sounds positive.  Musculoskeletal: no clubbing / cyanosis. No joint deformity upper and lower extremities. Good ROM, no contractures. Normal muscle tone.  Skin: no rashes, lesions, ulcers. No induration Neurologic: CN 2-12 grossly intact. Sensation intact, DTR normal. Strength 5/5 in all 4.  Psychiatric: Normal judgment and insight. Alert and oriented x 3. Normal mood.   [place] Office Visit from 06/27/2022 in [place] at [place]  PHQ-9 Total Score 5         Impression and Plan:  Encounter for preventive health examination  Mixed hyperlipidemia -  Plan: CBC with Differential/Platelet, Comprehensive metabolic panel, Hemoglobin A1c, Lipid panel, TSH, Vitamin B12, VITAMIN D 25 Hydroxy (Vit-D Deficiency, Fractures)  History of breast cancer   -Recommend routine eye and dental care. -Immunizations: Due for shingles vaccine, she elects to defer for now. -Healthy lifestyle discussed in detail. -Labs to be updated today. -Colon cancer screening: 10/2018 -Breast cancer screening: History of breast cancer, gets annual screenings in March, last 11/2021 -Cervical cancer screening: 07/2019 -Lung cancer screening: Not applicable -Prostate cancer screening: Not applicable -DEXA: Not applicable    Braian Tijerina Isaac Bliss, MD [facility]

## 2022-06-30 ENCOUNTER — Other Ambulatory Visit (HOSPITAL_COMMUNITY): Payer: Self-pay

## 2022-07-21 DIAGNOSIS — M79671 Pain in right foot: Secondary | ICD-10-CM | POA: Diagnosis not present

## 2022-07-29 ENCOUNTER — Other Ambulatory Visit (HOSPITAL_COMMUNITY): Payer: Self-pay

## 2022-07-29 DIAGNOSIS — M25571 Pain in right ankle and joints of right foot: Secondary | ICD-10-CM | POA: Diagnosis not present

## 2022-08-05 DIAGNOSIS — E890 Postprocedural hypothyroidism: Secondary | ICD-10-CM | POA: Diagnosis not present

## 2022-08-05 DIAGNOSIS — C73 Malignant neoplasm of thyroid gland: Secondary | ICD-10-CM | POA: Diagnosis not present

## 2022-08-15 DIAGNOSIS — M542 Cervicalgia: Secondary | ICD-10-CM | POA: Diagnosis not present

## 2022-08-15 DIAGNOSIS — M545 Low back pain, unspecified: Secondary | ICD-10-CM | POA: Diagnosis not present

## 2022-08-15 DIAGNOSIS — M9904 Segmental and somatic dysfunction of sacral region: Secondary | ICD-10-CM | POA: Diagnosis not present

## 2022-08-15 DIAGNOSIS — M9902 Segmental and somatic dysfunction of thoracic region: Secondary | ICD-10-CM | POA: Diagnosis not present

## 2022-08-15 DIAGNOSIS — M546 Pain in thoracic spine: Secondary | ICD-10-CM | POA: Diagnosis not present

## 2022-08-15 DIAGNOSIS — M9901 Segmental and somatic dysfunction of cervical region: Secondary | ICD-10-CM | POA: Diagnosis not present

## 2022-08-15 DIAGNOSIS — M9905 Segmental and somatic dysfunction of pelvic region: Secondary | ICD-10-CM | POA: Diagnosis not present

## 2022-08-15 DIAGNOSIS — M9903 Segmental and somatic dysfunction of lumbar region: Secondary | ICD-10-CM | POA: Diagnosis not present

## 2022-08-22 ENCOUNTER — Other Ambulatory Visit (HOSPITAL_COMMUNITY): Payer: Self-pay

## 2022-08-22 DIAGNOSIS — L821 Other seborrheic keratosis: Secondary | ICD-10-CM | POA: Diagnosis not present

## 2022-08-22 DIAGNOSIS — E890 Postprocedural hypothyroidism: Secondary | ICD-10-CM | POA: Diagnosis not present

## 2022-08-22 DIAGNOSIS — Z79890 Hormone replacement therapy (postmenopausal): Secondary | ICD-10-CM | POA: Diagnosis not present

## 2022-08-22 DIAGNOSIS — R768 Other specified abnormal immunological findings in serum: Secondary | ICD-10-CM | POA: Diagnosis not present

## 2022-08-22 DIAGNOSIS — D225 Melanocytic nevi of trunk: Secondary | ICD-10-CM | POA: Diagnosis not present

## 2022-08-22 DIAGNOSIS — Z85828 Personal history of other malignant neoplasm of skin: Secondary | ICD-10-CM | POA: Diagnosis not present

## 2022-08-22 DIAGNOSIS — Z08 Encounter for follow-up examination after completed treatment for malignant neoplasm: Secondary | ICD-10-CM | POA: Diagnosis not present

## 2022-08-22 DIAGNOSIS — D485 Neoplasm of uncertain behavior of skin: Secondary | ICD-10-CM | POA: Diagnosis not present

## 2022-08-22 DIAGNOSIS — L308 Other specified dermatitis: Secondary | ICD-10-CM | POA: Diagnosis not present

## 2022-08-22 DIAGNOSIS — Z85850 Personal history of malignant neoplasm of thyroid: Secondary | ICD-10-CM | POA: Diagnosis not present

## 2022-08-22 MED ORDER — BETAMETHASONE DIPROPIONATE 0.05 % EX CREA
1.0000 "application " | TOPICAL_CREAM | Freq: Two times a day (BID) | CUTANEOUS | 3 refills | Status: AC | PRN
  Filled 2022-08-22: qty 15, 30d supply, fill #0

## 2022-08-23 ENCOUNTER — Other Ambulatory Visit (HOSPITAL_COMMUNITY): Payer: Self-pay

## 2022-08-24 ENCOUNTER — Other Ambulatory Visit (HOSPITAL_COMMUNITY): Payer: Self-pay

## 2022-08-25 ENCOUNTER — Other Ambulatory Visit (HOSPITAL_COMMUNITY): Payer: Self-pay

## 2022-08-26 ENCOUNTER — Other Ambulatory Visit (HOSPITAL_COMMUNITY): Payer: Self-pay

## 2022-08-29 ENCOUNTER — Other Ambulatory Visit (HOSPITAL_COMMUNITY): Payer: Self-pay

## 2022-08-29 DIAGNOSIS — M545 Low back pain, unspecified: Secondary | ICD-10-CM | POA: Diagnosis not present

## 2022-08-29 DIAGNOSIS — M9905 Segmental and somatic dysfunction of pelvic region: Secondary | ICD-10-CM | POA: Diagnosis not present

## 2022-08-29 DIAGNOSIS — M9901 Segmental and somatic dysfunction of cervical region: Secondary | ICD-10-CM | POA: Diagnosis not present

## 2022-08-29 DIAGNOSIS — M542 Cervicalgia: Secondary | ICD-10-CM | POA: Diagnosis not present

## 2022-08-29 DIAGNOSIS — M9904 Segmental and somatic dysfunction of sacral region: Secondary | ICD-10-CM | POA: Diagnosis not present

## 2022-08-29 DIAGNOSIS — M546 Pain in thoracic spine: Secondary | ICD-10-CM | POA: Diagnosis not present

## 2022-08-29 DIAGNOSIS — M9902 Segmental and somatic dysfunction of thoracic region: Secondary | ICD-10-CM | POA: Diagnosis not present

## 2022-08-29 DIAGNOSIS — M9903 Segmental and somatic dysfunction of lumbar region: Secondary | ICD-10-CM | POA: Diagnosis not present

## 2022-09-05 ENCOUNTER — Encounter: Payer: Self-pay | Admitting: Adult Health

## 2022-09-05 ENCOUNTER — Other Ambulatory Visit (HOSPITAL_COMMUNITY)
Admission: RE | Admit: 2022-09-05 | Discharge: 2022-09-05 | Disposition: A | Discharge status: Home / Self Care | Payer: 59 | Source: Ambulatory Visit | Attending: Adult Health | Admitting: Adult Health

## 2022-09-05 ENCOUNTER — Ambulatory Visit (INDEPENDENT_AMBULATORY_CARE_PROVIDER_SITE_OTHER): Payer: 59 | Admitting: Adult Health

## 2022-09-05 VITALS — BP 105/69 | HR 75 | Ht 65.0 in | Wt 140.0 lb

## 2022-09-05 DIAGNOSIS — Z853 Personal history of malignant neoplasm of breast: Secondary | ICD-10-CM

## 2022-09-05 DIAGNOSIS — Z01419 Encounter for gynecological examination (general) (routine) without abnormal findings: Secondary | ICD-10-CM | POA: Insufficient documentation

## 2022-09-05 DIAGNOSIS — Z1211 Encounter for screening for malignant neoplasm of colon: Secondary | ICD-10-CM | POA: Diagnosis not present

## 2022-09-05 LAB — HEMOCCULT GUIAC POC 1CARD (OFFICE): Fecal Occult Blood, POC: NEGATIVE

## 2022-09-05 NOTE — Progress Notes (Signed)
Patient ID: Gwendolyn Flores, female   DOB: 1966/04/11, 56 y.o.   MRN: [id number] History of Present Illness: Gwendolyn Flores is a 56 year old white female,married, PM in for a well woman gyn exam and pap.   PCP is Dr Deniece Ree.   Current Medications, Allergies, Past Medical History, Past Surgical History, Family History and Social History were reviewed in [employer] record.     Review of Systems: Patient denies any headaches, hearing loss, fatigue, blurred vision, shortness of breath, chest pain, abdominal pain, problems with bowel movements, urination, or intercourse(not active). No joint pain or mood swings.  Has noticed white discharge in panties at times.   Physical Exam:BP 105/69 (BP Location: Right Arm, Patient Position: Sitting, Cuff Size: Normal)   Pulse 75   Ht '5\' 5"'$  (1.651 m)   Wt 140 lb (63.5 kg)   LMP 08/12/2016 Comment: GYN - [place]  BMI 23.30 kg/m   General:  Well developed, well nourished, no acute distress Skin:  Warm and dry Neck:  Midline trachea, surgically absent thyroid, good ROM, no lymphadenopathy Lungs; Clear to auscultation bilaterally Breast:  No dominant palpable mass, retraction, or nipple discharge,has lumpectomy scar left breast Cardiovascular: Regular rate and rhythm Abdomen:  Soft, non tender, no hepatosplenomegaly Pelvic:  External genitalia is normal in appearance, no lesions.  The vagina is normal in appearance. Urethra has no lesions or masses. The cervix is smooth, pap with HR HPV genotyping performed.  Uterus is felt to be normal size, shape, and contour.  No adnexal masses or tenderness noted.Bladder is non tender, no masses felt. Rectal: Good sphincter tone, no polyps, or hemorrhoids felt.  Hemoccult negative. Extremities/musculoskeletal:  No swelling or varicosities noted, no clubbing or cyanosis Psych:  No mood changes, alert and cooperative,seems happy AA is 2 Fall risk is low    09/05/2022   10:46 AM 06/27/2022    9:07 AM  08/16/2021    1:40 PM  Depression screen PHQ 2/9  Decreased Interest 0 0 0  Down, Depressed, Hopeless 0 0 0  PHQ - 2 Score 0 0 0  Altered sleeping '1 2 1  '$ Tired, decreased energy '1 3 1  '$ Change in appetite 0 0 0  Feeling bad or failure about yourself  0 0 0  Trouble concentrating 0 0 0  Moving slowly or fidgety/restless 0 0 0  Suicidal thoughts 0 0 0  PHQ-9 Score '2 5 2  '$ Difficult doing work/chores  Somewhat difficult        09/05/2022   10:47 AM 08/16/2021    1:40 PM 08/14/2020   12:57 PM  GAD 7 : Generalized Anxiety Score  Nervous, Anxious, on Edge 0 0 1  Control/stop worrying 0 0 0  Worry too much - different things 0 0 1  Trouble relaxing 1 0 1  Restless 0 0 0  Easily annoyed or irritable 0 0 1  Afraid - awful might happen 0 0 0  Total GAD 7 Score 1 0 4      Upstream - 09/05/22 1045       Pregnancy Intention Screening   Does the patient want to become pregnant in the next year? N/A    Does the patient's partner want to become pregnant in the next year? N/A    Would the patient like to discuss contraceptive options today? N/A      Contraception Wrap Up   Current Method Vasectomy   post menopausal   Reason for No Current Contraceptive Method  at Intake ([facility] Only) Other   post menopausal   End Method Vasectomy   post menopausal   Contraception Counseling Provided No             Examination chaperoned by Dewitt Hoes RN  Impression and Plan: 1. Encounter for routine gynecological examination with Papanicolaou smear of cervix Pap sent Pap in 3 years if normal Physical in 1 year Mammogram was negative 12/24/21 Colonoscopy per [facility] with PCP  2. Encounter for screening fecal occult blood testing Hemoccult was negative   3. History of breast cancer Negative mammogram 12/24/21

## 2022-09-05 NOTE — Addendum Note (Signed)
Addended by: Jesusita Oka on: 09/05/2022 11:21 AM   Modules accepted: Orders

## 2022-09-08 LAB — CYTOLOGY - PAP
Comment: NEGATIVE
Diagnosis: NEGATIVE
Diagnosis: REACTIVE
High risk HPV: NEGATIVE

## 2022-09-09 DIAGNOSIS — M79671 Pain in right foot: Secondary | ICD-10-CM | POA: Diagnosis not present

## 2022-09-09 DIAGNOSIS — S92354D Nondisplaced fracture of fifth metatarsal bone, right foot, subsequent encounter for fracture with routine healing: Secondary | ICD-10-CM | POA: Diagnosis not present

## 2022-09-12 DIAGNOSIS — M545 Low back pain, unspecified: Secondary | ICD-10-CM | POA: Diagnosis not present

## 2022-09-12 DIAGNOSIS — M546 Pain in thoracic spine: Secondary | ICD-10-CM | POA: Diagnosis not present

## 2022-09-12 DIAGNOSIS — M9903 Segmental and somatic dysfunction of lumbar region: Secondary | ICD-10-CM | POA: Diagnosis not present

## 2022-09-12 DIAGNOSIS — M542 Cervicalgia: Secondary | ICD-10-CM | POA: Diagnosis not present

## 2022-09-12 DIAGNOSIS — M9905 Segmental and somatic dysfunction of pelvic region: Secondary | ICD-10-CM | POA: Diagnosis not present

## 2022-09-12 DIAGNOSIS — M9902 Segmental and somatic dysfunction of thoracic region: Secondary | ICD-10-CM | POA: Diagnosis not present

## 2022-09-12 DIAGNOSIS — M9904 Segmental and somatic dysfunction of sacral region: Secondary | ICD-10-CM | POA: Diagnosis not present

## 2022-09-12 DIAGNOSIS — M9901 Segmental and somatic dysfunction of cervical region: Secondary | ICD-10-CM | POA: Diagnosis not present

## 2022-09-24 ENCOUNTER — Other Ambulatory Visit: Payer: Self-pay

## 2022-09-25 DIAGNOSIS — Z760 Encounter for issue of repeat prescription: Secondary | ICD-10-CM | POA: Diagnosis not present

## 2022-09-28 ENCOUNTER — Other Ambulatory Visit (HOSPITAL_COMMUNITY): Payer: Self-pay

## 2022-09-29 ENCOUNTER — Other Ambulatory Visit: Payer: Self-pay

## 2022-09-29 ENCOUNTER — Other Ambulatory Visit (HOSPITAL_COMMUNITY): Payer: Self-pay

## 2022-10-10 DIAGNOSIS — M9905 Segmental and somatic dysfunction of pelvic region: Secondary | ICD-10-CM | POA: Diagnosis not present

## 2022-10-10 DIAGNOSIS — M545 Low back pain, unspecified: Secondary | ICD-10-CM | POA: Diagnosis not present

## 2022-10-10 DIAGNOSIS — M9904 Segmental and somatic dysfunction of sacral region: Secondary | ICD-10-CM | POA: Diagnosis not present

## 2022-10-10 DIAGNOSIS — M9901 Segmental and somatic dysfunction of cervical region: Secondary | ICD-10-CM | POA: Diagnosis not present

## 2022-10-10 DIAGNOSIS — M546 Pain in thoracic spine: Secondary | ICD-10-CM | POA: Diagnosis not present

## 2022-10-10 DIAGNOSIS — M9903 Segmental and somatic dysfunction of lumbar region: Secondary | ICD-10-CM | POA: Diagnosis not present

## 2022-10-10 DIAGNOSIS — M9902 Segmental and somatic dysfunction of thoracic region: Secondary | ICD-10-CM | POA: Diagnosis not present

## 2022-10-10 DIAGNOSIS — M542 Cervicalgia: Secondary | ICD-10-CM | POA: Diagnosis not present

## 2022-10-18 ENCOUNTER — Other Ambulatory Visit (HOSPITAL_COMMUNITY): Payer: Self-pay

## 2022-10-21 ENCOUNTER — Other Ambulatory Visit (HOSPITAL_COMMUNITY): Payer: Self-pay

## 2022-10-21 DIAGNOSIS — S92354D Nondisplaced fracture of fifth metatarsal bone, right foot, subsequent encounter for fracture with routine healing: Secondary | ICD-10-CM | POA: Diagnosis not present

## 2022-10-21 MED ORDER — MELOXICAM 7.5 MG PO TABS
7.5000 mg | ORAL_TABLET | Freq: Every day | ORAL | 0 refills | Status: DC | PRN
  Filled 2022-10-21: qty 14, 14d supply, fill #0

## 2022-10-24 ENCOUNTER — Other Ambulatory Visit: Payer: Self-pay

## 2022-10-26 ENCOUNTER — Other Ambulatory Visit (HOSPITAL_COMMUNITY): Payer: Self-pay

## 2022-10-28 ENCOUNTER — Other Ambulatory Visit: Payer: Self-pay | Admitting: Neurology

## 2022-10-28 ENCOUNTER — Other Ambulatory Visit (HOSPITAL_COMMUNITY): Payer: Self-pay

## 2022-11-01 ENCOUNTER — Other Ambulatory Visit (HOSPITAL_COMMUNITY): Payer: Self-pay

## 2022-11-02 MED ORDER — EMGALITY 120 MG/ML ~~LOC~~ SOAJ
1.0000 mL | SUBCUTANEOUS | 0 refills | Status: DC
  Filled 2022-11-02: qty 1, 30d supply, fill #0

## 2022-11-03 ENCOUNTER — Other Ambulatory Visit: Payer: Self-pay | Admitting: Neurology

## 2022-11-03 ENCOUNTER — Other Ambulatory Visit (HOSPITAL_COMMUNITY): Payer: Self-pay

## 2022-11-03 DIAGNOSIS — G43709 Chronic migraine without aura, not intractable, without status migrainosus: Secondary | ICD-10-CM

## 2022-11-03 MED ORDER — SUMATRIPTAN SUCCINATE 100 MG PO TABS
100.0000 mg | ORAL_TABLET | ORAL | 11 refills | Status: DC | PRN
  Filled 2022-11-03: qty 9, 1d supply, fill #0

## 2022-11-03 MED ORDER — EMGALITY 120 MG/ML ~~LOC~~ SOAJ
1.0000 mL | SUBCUTANEOUS | 11 refills | Status: DC
  Filled 2022-11-03 – 2022-11-05 (×2): qty 3, 90d supply, fill #0
  Filled 2023-01-31: qty 3, 90d supply, fill #1
  Filled 2023-05-02: qty 3, 90d supply, fill #2

## 2022-11-04 ENCOUNTER — Other Ambulatory Visit (HOSPITAL_COMMUNITY): Payer: Self-pay

## 2022-11-05 ENCOUNTER — Other Ambulatory Visit (HOSPITAL_COMMUNITY): Payer: Self-pay

## 2022-11-07 ENCOUNTER — Other Ambulatory Visit: Payer: Self-pay

## 2022-11-07 ENCOUNTER — Other Ambulatory Visit (HOSPITAL_COMMUNITY): Payer: Self-pay

## 2022-11-07 DIAGNOSIS — M546 Pain in thoracic spine: Secondary | ICD-10-CM | POA: Diagnosis not present

## 2022-11-07 DIAGNOSIS — M9902 Segmental and somatic dysfunction of thoracic region: Secondary | ICD-10-CM | POA: Diagnosis not present

## 2022-11-07 DIAGNOSIS — M9904 Segmental and somatic dysfunction of sacral region: Secondary | ICD-10-CM | POA: Diagnosis not present

## 2022-11-07 DIAGNOSIS — M9903 Segmental and somatic dysfunction of lumbar region: Secondary | ICD-10-CM | POA: Diagnosis not present

## 2022-11-07 DIAGNOSIS — M9905 Segmental and somatic dysfunction of pelvic region: Secondary | ICD-10-CM | POA: Diagnosis not present

## 2022-11-07 DIAGNOSIS — M545 Low back pain, unspecified: Secondary | ICD-10-CM | POA: Diagnosis not present

## 2022-11-07 DIAGNOSIS — M542 Cervicalgia: Secondary | ICD-10-CM | POA: Diagnosis not present

## 2022-11-07 DIAGNOSIS — M9901 Segmental and somatic dysfunction of cervical region: Secondary | ICD-10-CM | POA: Diagnosis not present

## 2022-11-10 ENCOUNTER — Other Ambulatory Visit (HOSPITAL_COMMUNITY): Payer: Self-pay

## 2022-11-11 ENCOUNTER — Other Ambulatory Visit (HOSPITAL_COMMUNITY): Payer: Self-pay

## 2022-11-11 NOTE — Progress Notes (Signed)
Patient Care Team: Isaac Bliss, Rayford Halsted, MD as PCP - General (Internal Medicine) Nicholas Lose, MD as Consulting Physician (Hematology and Oncology) Kyung Rudd, MD as Consulting Physician (Radiation Oncology) Virl Cagey, MD as Consulting Physician (General Surgery)  DIAGNOSIS: No diagnosis found.  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 Diagnostic mammogram showed a 0.5cm group of calcifications in the upper-outer left breast. Biopsy showed high grade DCIS, ER+ 100%, PR+ 90%.    02/03/2020 Surgery   Left lumpectomy [name]) [phone]): high grade DCIS, 1.2cm, negative margins, 1 left axillary lymph node negative for carcinoma.   02/03/2020 Cancer Staging   Staging form: Breast, AJCC 8th Edition - Pathologic stage from 02/03/2020: Stage 0 (pTis (DCIS), pN0, cM0, ER+, PR+)   03/09/2020 - 04/06/2020 Radiation Therapy   The patient initially received a dose of 42.56 Gy in 16 fractions to the breast using whole-breast tangent fields. This was delivered using a 3-D conformal technique. The patient then received a boost to the seroma. This delivered an additional 8 Gy in 60factions using a 3 field photon technique due to the depth of the seroma. The total dose was 50.56 Gy.   03/27/2020 Genetic Testing   Negative genetic testing:  No pathogenic variants detected on the [facility] Common Hereditary Cancers Panel. The report date is 03/27/2020.  The Common Hereditary Cancers Panel offered by [facility] includes sequencing and/or deletion duplication testing of the following 48 genes: APC, ATM, AXIN2, BARD1, BMPR1A, BRCA1, BRCA2, BRIP1, CDH1, CDK4, CDKN2A (p14ARF), CDKN2A (p16INK4a), CHEK2, CTNNA1, DICER1, EPCAM (Deletion/duplication testing only), GREM1 (promoter region deletion/duplication testing only), KIT, MEN1, MLH1, MSH2,  MSH3, MSH6, MUTYH, NBN, NF1, NTHL1, PALB2, PDGFRA, PMS2, POLD1, POLE, PTEN, RAD50, RAD51C, RAD51D, RNF43, SDHB, SDHC, SDHD, SMAD4, SMARCA4. STK11, TP53, TSC1, TSC2, and VHL.  The following genes were evaluated for sequence changes only: SDHA and HOXB13 c.251G>A variant only.    Anti-estrogen oral therapy   Plan to begin Anastrozole x 5 years after bone density test.     CHIEF COMPLIANT: Follow-up of left breast DCIS   INTERVAL HISTORY: Gwendolyn Flores a 57y.o. with above-mentioned history of left breast DCIS who underwent a left lumpectomy. She presents to the clinic today for follow-up.    ALLERGIES:  is allergic to codeine, levaquin [levofloxacin in d5w], and sulfa antibiotics.  MEDICATIONS:  Current Outpatient Medications  Medication Sig Dispense Refill   anastrozole (ARIMIDEX) 1 MG tablet Take 1 tablet (1 mg total) by mouth daily. (Patient not taking: Reported on 06/27/2022) 30 tablet 0   Ascorbic Acid (VITAMIN C) 1000 MG tablet Take 1,000 mg by mouth daily.     aspirin-acetaminophen-caffeine (EXCEDRIN MIGRAINE) 250-250-65 MG tablet Take 1 tablet by mouth every 6 (six) hours as needed for headache.      betamethasone dipropionate 0.05 % cream Apply 1 application  topically tothe affected area up to 2 (two) times daily as needed. Not to face,groin or underarms 60 g 3   Calcium-Magnesium-Vitamin D (CALCIUM MAGNESIUM PO) Take 3 tablets by mouth daily.     Diclofenac Potassium,Migraine, 50 MG PACK Take 50 mg by mouth once as needed. For migraine. 9 each 11   Galcanezumab-gnlm (EMGALITY) 120 MG/ML SOAJ Inject 1 mL into the skin every 30 (thirty) days. 3 mL 11   ibuprofen (ADVIL,MOTRIN) 200 MG tablet Take 400 mg by mouth every 6 (six) hours as  needed for moderate pain.      Magnesium Gluconate 550 MG TABS Take 1 tablet twice a day     meloxicam (MOBIC) 7.5 MG tablet Take 1 tablet (7.5 mg total) by mouth daily as needed. 14 tablet 0   naproxen (NAPROSYN) 500 MG tablet Take with Imitrex for  migraine or vertigo. (Patient taking differently: Take 500 mg by mouth daily as needed (migraine or vertigo).) 60 tablet 12   Rimegepant Sulfate (NURTEC) 75 MG TBDP Take 75 mg by mouth daily as needed. For migraines. Take as close to onset of migraine as possible. One daily maximum. 10 tablet 6   SUMAtriptan (IMITREX) 100 MG tablet Take 1 tablet (100 mg total) by mouth every 2 (two) hours as needed for migraine 9 tablet 11   SYNTHROID 100 MCG tablet Take 1 tablet 5 days a week and just 1/2 tablet on Saturday and Sunday at bedtime on empty stomach for thyroid 72 tablet 4   VITAMIN D PO Take 1 tablet by mouth daily.      No current facility-administered medications for this visit.    PHYSICAL EXAMINATION: ECOG PERFORMANCE STATUS: {CHL ONC ECOG PS:[phone]}  There were no vitals filed for this visit. There were no vitals filed for this visit.  BREAST:*** No palpable masses or nodules in either right or left breasts. No palpable axillary supraclavicular or infraclavicular adenopathy no breast tenderness or nipple discharge. (exam performed in the presence of a chaperone)  LABORATORY DATA:  I have reviewed the data as listed    Latest Ref Rng & Units 06/27/2022    9:32 AM 06/25/2021   11:32 AM 01/13/2020   10:35 AM  CMP  Glucose 70 - 99 mg/dL 76  83  71   BUN 6 - 23 mg/dL 20  14  13   $ Creatinine 0.40 - 1.20 mg/dL 0.97  0.91  0.85   Sodium 135 - 145 mEq/L 138  139  139   Potassium 3.5 - 5.1 mEq/L 3.7  4.0  3.9   Chloride 96 - 112 mEq/L 104  104  104   CO2 19 - 32 mEq/L 27  28  28   $ Calcium 8.4 - 10.5 mg/dL 9.4  9.6  9.4   Total Protein 6.0 - 8.3 g/dL 7.0  7.2  6.5   Total Bilirubin 0.2 - 1.2 mg/dL 0.4  0.5  0.5   Alkaline Phos 39 - 117 U/L 61  57  62   AST 0 - 37 U/L 34  21  18   ALT 0 - 35 U/L 33  21  18     Lab Results  Component Value Date   WBC 3.8 (L) 06/27/2022   HGB 13.6 06/27/2022   HCT 40.1 06/27/2022   MCV 96.3 06/27/2022   PLT 204.0 06/27/2022   NEUTROABS 1.6  06/27/2022    ASSESSMENT & PLAN:  No problem-specific Assessment & Plan notes found for this encounter.    No orders of the defined types were placed in this encounter.  The patient has a good understanding of the overall plan. she agrees with it. she will call with any problems that may develop before the next visit here. Total time spent: 30 mins including face to face time and time spent for planning, charting and co-ordination of care   Suzzette Righter, [place] 11/11/22    I Gardiner Coins am acting as a Education administrator for [employer]  ***

## 2022-11-14 ENCOUNTER — Other Ambulatory Visit (HOSPITAL_COMMUNITY): Payer: Self-pay

## 2022-11-14 ENCOUNTER — Inpatient Hospital Stay: Payer: 59 | Attending: Hematology and Oncology | Admitting: Hematology and Oncology

## 2022-11-14 ENCOUNTER — Other Ambulatory Visit (HOSPITAL_COMMUNITY): Payer: Self-pay | Admitting: Hematology and Oncology

## 2022-11-14 VITALS — BP 111/75 | HR 72 | Temp 97.5°F | Resp 14 | Ht 65.0 in | Wt 139.3 lb

## 2022-11-14 DIAGNOSIS — D0512 Intraductal carcinoma in situ of left breast: Secondary | ICD-10-CM

## 2022-11-14 DIAGNOSIS — Z1231 Encounter for screening mammogram for malignant neoplasm of breast: Secondary | ICD-10-CM

## 2022-11-14 DIAGNOSIS — M8580 Other specified disorders of bone density and structure, unspecified site: Secondary | ICD-10-CM | POA: Insufficient documentation

## 2022-11-14 DIAGNOSIS — Z79810 Long term (current) use of selective estrogen receptor modulators (SERMs): Secondary | ICD-10-CM | POA: Diagnosis not present

## 2022-11-14 MED ORDER — TAMOXIFEN CITRATE 10 MG PO TABS
5.0000 mg | ORAL_TABLET | Freq: Two times a day (BID) | ORAL | 0 refills | Status: DC
  Filled 2022-11-14 (×2): qty 30, 30d supply, fill #0

## 2022-11-14 NOTE — Assessment & Plan Note (Addendum)
01/29/2020:Screening mammogram showed left breast calcifications. Diagnostic mammogram showed a 0.5cm group of calcifications in the upper-outer left breast. Biopsy showed high grade DCIS, ER+ 100%, PR+ 90%.  02/03/2020: Left lumpectomy: High-grade DCIS 1.2 cm, 1 axillary lymph node negative, margins negative   Adjuvant radiation 03/10/2020-04/06/2020   Treatment plan: Adjuvant antiestrogen therapy with tamoxifen 5 mg daily x 5 years (patient is willing to try it at this time.)  This is because tamoxifen has additional benefits including lowering cholesterol as well as increasing bone density.  osteopenia with a T score of -1.5 and elevated cholesterol numbers.   Because of her history of coronary artery disease we decided to use anastrozole over tamoxifen        Breast cancer surveillance: 1. mammogram 12/24/2021: Benign breast density category C 2.  Bone density 05/18/2020: T score -1.5: Osteopenia: Calcium and vitamin D we will request a new bone density test to be done   Patient's husband Dr. Aviva Signs is our surgeon at [facility].   She will start tamoxifen and let us know if she needs refills. Return to clinic in 1 year for follow-up

## 2022-11-15 ENCOUNTER — Telehealth: Payer: Self-pay | Admitting: Hematology and Oncology

## 2022-11-15 ENCOUNTER — Ambulatory Visit: Payer: 59 | Admitting: Hematology and Oncology

## 2022-11-15 NOTE — Telephone Encounter (Signed)
Unable to get the patient scheduled per Dr.Gudena's 2/19 los. Dr.Gudena's 12/2023 template has not been released at this time.

## 2022-11-24 ENCOUNTER — Other Ambulatory Visit (HOSPITAL_COMMUNITY): Payer: Self-pay

## 2022-11-25 ENCOUNTER — Telehealth (INDEPENDENT_AMBULATORY_CARE_PROVIDER_SITE_OTHER): Payer: Commercial Managed Care - PPO | Admitting: General Surgery

## 2022-11-25 MED ORDER — HYDROCORTISONE (PERIANAL) 2.5 % EX CREA: 1.0000 | TOPICAL_CREAM | Freq: Four times a day (QID) | CUTANEOUS | 1 refills | Status: AC

## 2022-11-25 NOTE — Telephone Encounter (Signed)
[facility]  Patient with small external hemorrhoid with pain. Anusol HC cream sent in.   Curlene Labrum, MD [facility] [address] [place], [place] [address] [phone] (office)

## 2022-12-01 ENCOUNTER — Other Ambulatory Visit: Payer: Self-pay

## 2022-12-05 ENCOUNTER — Other Ambulatory Visit: Payer: Self-pay

## 2022-12-08 ENCOUNTER — Other Ambulatory Visit (HOSPITAL_COMMUNITY): Payer: Self-pay

## 2022-12-08 ENCOUNTER — Other Ambulatory Visit: Payer: Self-pay

## 2022-12-08 ENCOUNTER — Encounter (HOSPITAL_COMMUNITY): Payer: Self-pay

## 2022-12-12 DIAGNOSIS — M546 Pain in thoracic spine: Secondary | ICD-10-CM | POA: Diagnosis not present

## 2022-12-12 DIAGNOSIS — M9904 Segmental and somatic dysfunction of sacral region: Secondary | ICD-10-CM | POA: Diagnosis not present

## 2022-12-12 DIAGNOSIS — M545 Low back pain, unspecified: Secondary | ICD-10-CM | POA: Diagnosis not present

## 2022-12-12 DIAGNOSIS — M9901 Segmental and somatic dysfunction of cervical region: Secondary | ICD-10-CM | POA: Diagnosis not present

## 2022-12-12 DIAGNOSIS — M542 Cervicalgia: Secondary | ICD-10-CM | POA: Diagnosis not present

## 2022-12-12 DIAGNOSIS — M9902 Segmental and somatic dysfunction of thoracic region: Secondary | ICD-10-CM | POA: Diagnosis not present

## 2022-12-12 DIAGNOSIS — M9905 Segmental and somatic dysfunction of pelvic region: Secondary | ICD-10-CM | POA: Diagnosis not present

## 2022-12-12 DIAGNOSIS — M9903 Segmental and somatic dysfunction of lumbar region: Secondary | ICD-10-CM | POA: Diagnosis not present

## 2022-12-19 ENCOUNTER — Encounter: Payer: Self-pay | Admitting: Hematology and Oncology

## 2022-12-19 ENCOUNTER — Encounter: Payer: Self-pay | Admitting: *Deleted

## 2022-12-19 NOTE — Progress Notes (Signed)
Received mychart message from pt with complaint of increased fatigue, hot flashes, and brain fog while on Tamoxifen.  Per MD pt needing to stop for 2-3 weeks and f/u in office with NP. Pt notified and message sent to scheduling team.

## 2023-01-02 ENCOUNTER — Ambulatory Visit (HOSPITAL_COMMUNITY)
Admission: RE | Admit: 2023-01-02 | Discharge: 2023-01-02 | Disposition: A | Discharge status: Home / Self Care | Payer: 59 | Source: Ambulatory Visit | Attending: Hematology and Oncology | Admitting: Hematology and Oncology

## 2023-01-02 DIAGNOSIS — Z1231 Encounter for screening mammogram for malignant neoplasm of breast: Secondary | ICD-10-CM | POA: Diagnosis not present

## 2023-01-30 DIAGNOSIS — M9904 Segmental and somatic dysfunction of sacral region: Secondary | ICD-10-CM | POA: Diagnosis not present

## 2023-01-30 DIAGNOSIS — M9903 Segmental and somatic dysfunction of lumbar region: Secondary | ICD-10-CM | POA: Diagnosis not present

## 2023-01-30 DIAGNOSIS — M542 Cervicalgia: Secondary | ICD-10-CM | POA: Diagnosis not present

## 2023-01-30 DIAGNOSIS — M9901 Segmental and somatic dysfunction of cervical region: Secondary | ICD-10-CM | POA: Diagnosis not present

## 2023-01-30 DIAGNOSIS — M9902 Segmental and somatic dysfunction of thoracic region: Secondary | ICD-10-CM | POA: Diagnosis not present

## 2023-01-30 DIAGNOSIS — M9905 Segmental and somatic dysfunction of pelvic region: Secondary | ICD-10-CM | POA: Diagnosis not present

## 2023-01-30 DIAGNOSIS — M546 Pain in thoracic spine: Secondary | ICD-10-CM | POA: Diagnosis not present

## 2023-01-30 DIAGNOSIS — M545 Low back pain, unspecified: Secondary | ICD-10-CM | POA: Diagnosis not present

## 2023-02-01 ENCOUNTER — Other Ambulatory Visit (HOSPITAL_COMMUNITY): Payer: Self-pay

## 2023-02-03 DIAGNOSIS — E890 Postprocedural hypothyroidism: Secondary | ICD-10-CM | POA: Diagnosis not present

## 2023-02-13 DIAGNOSIS — C73 Malignant neoplasm of thyroid gland: Secondary | ICD-10-CM | POA: Diagnosis not present

## 2023-02-13 DIAGNOSIS — E890 Postprocedural hypothyroidism: Secondary | ICD-10-CM | POA: Diagnosis not present

## 2023-03-20 DIAGNOSIS — M9901 Segmental and somatic dysfunction of cervical region: Secondary | ICD-10-CM | POA: Diagnosis not present

## 2023-03-20 DIAGNOSIS — M9904 Segmental and somatic dysfunction of sacral region: Secondary | ICD-10-CM | POA: Diagnosis not present

## 2023-03-20 DIAGNOSIS — M542 Cervicalgia: Secondary | ICD-10-CM | POA: Diagnosis not present

## 2023-03-20 DIAGNOSIS — M9903 Segmental and somatic dysfunction of lumbar region: Secondary | ICD-10-CM | POA: Diagnosis not present

## 2023-03-20 DIAGNOSIS — M9905 Segmental and somatic dysfunction of pelvic region: Secondary | ICD-10-CM | POA: Diagnosis not present

## 2023-03-20 DIAGNOSIS — M546 Pain in thoracic spine: Secondary | ICD-10-CM | POA: Diagnosis not present

## 2023-03-20 DIAGNOSIS — M545 Low back pain, unspecified: Secondary | ICD-10-CM | POA: Diagnosis not present

## 2023-03-20 DIAGNOSIS — M9902 Segmental and somatic dysfunction of thoracic region: Secondary | ICD-10-CM | POA: Diagnosis not present

## 2023-03-31 ENCOUNTER — Other Ambulatory Visit (HOSPITAL_COMMUNITY): Payer: Self-pay

## 2023-05-01 DIAGNOSIS — M9903 Segmental and somatic dysfunction of lumbar region: Secondary | ICD-10-CM | POA: Diagnosis not present

## 2023-05-01 DIAGNOSIS — M9905 Segmental and somatic dysfunction of pelvic region: Secondary | ICD-10-CM | POA: Diagnosis not present

## 2023-05-01 DIAGNOSIS — M9904 Segmental and somatic dysfunction of sacral region: Secondary | ICD-10-CM | POA: Diagnosis not present

## 2023-05-01 DIAGNOSIS — M546 Pain in thoracic spine: Secondary | ICD-10-CM | POA: Diagnosis not present

## 2023-05-01 DIAGNOSIS — M9901 Segmental and somatic dysfunction of cervical region: Secondary | ICD-10-CM | POA: Diagnosis not present

## 2023-05-01 DIAGNOSIS — M545 Low back pain, unspecified: Secondary | ICD-10-CM | POA: Diagnosis not present

## 2023-05-01 DIAGNOSIS — M9902 Segmental and somatic dysfunction of thoracic region: Secondary | ICD-10-CM | POA: Diagnosis not present

## 2023-05-01 DIAGNOSIS — M542 Cervicalgia: Secondary | ICD-10-CM | POA: Diagnosis not present

## 2023-05-03 ENCOUNTER — Other Ambulatory Visit (HOSPITAL_COMMUNITY): Payer: Self-pay

## 2023-05-10 ENCOUNTER — Other Ambulatory Visit (HOSPITAL_COMMUNITY): Payer: Self-pay

## 2023-05-10 ENCOUNTER — Telehealth: Payer: Self-pay

## 2023-05-14 ENCOUNTER — Telehealth: Payer: Self-pay | Admitting: Neurology

## 2023-05-14 ENCOUNTER — Other Ambulatory Visit: Payer: Self-pay | Admitting: Neurology

## 2023-05-14 DIAGNOSIS — G43709 Chronic migraine without aura, not intractable, without status migrainosus: Secondary | ICD-10-CM

## 2023-05-14 MED ORDER — NAPROXEN 500 MG PO TABS
500.0000 mg | ORAL_TABLET | Freq: Every day | ORAL | 11 refills | Status: DC | PRN
Start: 2023-05-14 — End: 2023-09-25
  Filled 2023-05-14: qty 30, 30d supply, fill #0

## 2023-05-14 MED ORDER — EMGALITY 120 MG/ML ~~LOC~~ SOAJ
1.0000 mL | SUBCUTANEOUS | 11 refills | Status: DC
Start: 2023-05-14 — End: 2024-05-22
  Filled 2023-05-14 – 2023-05-15 (×2): qty 3, 90d supply, fill #0
  Filled 2023-08-27: qty 3, 90d supply, fill #1
  Filled 2023-12-04: qty 3, 90d supply, fill #2
  Filled 2024-03-17: qty 3, 90d supply, fill #3

## 2023-05-14 MED ORDER — SUMATRIPTAN SUCCINATE 100 MG PO TABS
100.0000 mg | ORAL_TABLET | ORAL | 11 refills | Status: DC | PRN
Start: 2023-05-14 — End: 2024-05-22
  Filled 2023-05-14: qty 9, 30d supply, fill #0

## 2023-05-14 NOTE — Telephone Encounter (Signed)
Please fill out PA and I will get her an appointment, s[oke with her and addended note

## 2023-05-14 NOTE — Telephone Encounter (Signed)
Spoke with patient. She still get good efficacy from emgality for her chronic migraines will reorder. She will see [place] in follow up but in the meantime we will fill out preathorization. > 50% improvement in migraine freq and severity and duration on emgality. Please fill out PA

## 2023-05-15 ENCOUNTER — Other Ambulatory Visit: Payer: Self-pay

## 2023-05-15 ENCOUNTER — Other Ambulatory Visit (HOSPITAL_COMMUNITY): Payer: Self-pay

## 2023-05-15 NOTE — Telephone Encounter (Signed)
Gwendolyn Flores (Key: Gentry Roch) Rx #: [id number] Emgality 120MG /ML auto-injectors (migraine)  PA Emgality complete . Waiting on approval

## 2023-05-15 NOTE — Telephone Encounter (Signed)
Merged with other phone note

## 2023-05-15 NOTE — Telephone Encounter (Signed)
PA Emgality  in process today

## 2023-05-16 ENCOUNTER — Other Ambulatory Visit: Payer: Self-pay

## 2023-05-16 ENCOUNTER — Other Ambulatory Visit (HOSPITAL_COMMUNITY): Payer: Self-pay

## 2023-05-16 ENCOUNTER — Telehealth: Payer: 59 | Admitting: Hematology and Oncology

## 2023-05-19 ENCOUNTER — Other Ambulatory Visit (HOSPITAL_COMMUNITY): Payer: Self-pay

## 2023-05-19 NOTE — Telephone Encounter (Signed)
Pharmacy Patient Advocate Encounter  Received notification from [facility] that Prior Authorization for Emgality 120MG /ML Autp-Injector has been APPROVED from 05/17/2023 to 05/15/2024. Ran test claim, Copay is $125.00. This test claim was processed through [facility]- copay amounts may vary at other pharmacies due to pharmacy/plan contracts, or as the patient moves through the different stages of their insurance plan.   PA #/Case ID/Reference #: [id number]

## 2023-05-23 ENCOUNTER — Other Ambulatory Visit (HOSPITAL_COMMUNITY): Payer: Self-pay

## 2023-05-23 NOTE — Telephone Encounter (Signed)
Pt reports that the pharmacy is telling her the copay card needs to be activated, pt states she does not know how to do that, she is asking for a call to discuss how to move forward.

## 2023-05-30 ENCOUNTER — Other Ambulatory Visit (HOSPITAL_COMMUNITY): Payer: Self-pay

## 2023-05-30 MED ORDER — PROMETHAZINE HCL 25 MG PO TABS
25.0000 mg | ORAL_TABLET | Freq: Once | ORAL | 0 refills | Status: AC
  Filled 2023-05-30: qty 1, 1d supply, fill #0

## 2023-05-30 MED ORDER — HYDROMORPHONE HCL 2 MG PO TABS
2.0000 mg | ORAL_TABLET | Freq: Once | ORAL | 0 refills | Status: AC
  Filled 2023-05-30: qty 1, 1d supply, fill #0

## 2023-05-30 MED ORDER — TRIAZOLAM 0.25 MG PO TABS
0.2500 mg | ORAL_TABLET | Freq: Once | ORAL | 0 refills | Status: DC
  Filled 2023-05-30: qty 1, 1d supply, fill #0

## 2023-06-08 ENCOUNTER — Other Ambulatory Visit (HOSPITAL_COMMUNITY): Payer: Self-pay

## 2023-06-08 ENCOUNTER — Other Ambulatory Visit: Payer: Self-pay

## 2023-06-08 ENCOUNTER — Encounter: Payer: Self-pay | Admitting: Pharmacist

## 2023-06-08 ENCOUNTER — Encounter (HOSPITAL_COMMUNITY): Payer: Self-pay

## 2023-06-09 ENCOUNTER — Other Ambulatory Visit: Payer: Self-pay

## 2023-07-25 ENCOUNTER — Other Ambulatory Visit: Payer: Self-pay

## 2023-08-07 DIAGNOSIS — E890 Postprocedural hypothyroidism: Secondary | ICD-10-CM | POA: Diagnosis not present

## 2023-08-11 ENCOUNTER — Other Ambulatory Visit (HOSPITAL_BASED_OUTPATIENT_CLINIC_OR_DEPARTMENT_OTHER): Payer: Self-pay

## 2023-08-11 MED ORDER — ZOSTER VAC RECOMB ADJUVANTED 50 MCG/0.5ML IM SUSR
0.5000 mL | Freq: Once | INTRAMUSCULAR | 1 refills | Status: AC
  Filled 2023-08-11: qty 0.5, 1d supply, fill #0

## 2023-08-14 DIAGNOSIS — E890 Postprocedural hypothyroidism: Secondary | ICD-10-CM | POA: Diagnosis not present

## 2023-08-25 ENCOUNTER — Other Ambulatory Visit (HOSPITAL_BASED_OUTPATIENT_CLINIC_OR_DEPARTMENT_OTHER): Payer: Self-pay

## 2023-08-25 MED ORDER — INFLUENZA VIRUS VACC SPLIT PF (FLUZONE) 0.5 ML IM SUSY
0.5000 mL | PREFILLED_SYRINGE | Freq: Once | INTRAMUSCULAR | 0 refills | Status: AC
  Filled 2023-08-25: qty 0.5, 1d supply, fill #0

## 2023-08-28 ENCOUNTER — Other Ambulatory Visit: Payer: Self-pay

## 2023-09-25 ENCOUNTER — Encounter: Payer: Self-pay | Admitting: Adult Health

## 2023-09-25 ENCOUNTER — Ambulatory Visit: Payer: 59 | Admitting: Adult Health

## 2023-09-25 VITALS — BP 104/67 | HR 84 | Ht 65.0 in | Wt 139.0 lb

## 2023-09-25 DIAGNOSIS — Z85850 Personal history of malignant neoplasm of thyroid: Secondary | ICD-10-CM | POA: Diagnosis not present

## 2023-09-25 DIAGNOSIS — Z853 Personal history of malignant neoplasm of breast: Secondary | ICD-10-CM | POA: Diagnosis not present

## 2023-09-25 DIAGNOSIS — Z1211 Encounter for screening for malignant neoplasm of colon: Secondary | ICD-10-CM | POA: Diagnosis not present

## 2023-09-25 DIAGNOSIS — Z1331 Encounter for screening for depression: Secondary | ICD-10-CM

## 2023-09-25 DIAGNOSIS — N816 Rectocele: Secondary | ICD-10-CM

## 2023-09-25 DIAGNOSIS — Z01419 Encounter for gynecological examination (general) (routine) without abnormal findings: Secondary | ICD-10-CM

## 2023-09-25 DIAGNOSIS — K649 Unspecified hemorrhoids: Secondary | ICD-10-CM | POA: Diagnosis not present

## 2023-09-25 LAB — HEMOCCULT GUIAC POC 1CARD (OFFICE): Fecal Occult Blood, POC: NEGATIVE

## 2023-09-25 NOTE — Progress Notes (Signed)
Patient ID: Gwendolyn Flores, female   DOB: Aug 01, 1966, 57 y.o.   MRN: [id number] History of Present Illness: Gwendolyn Flores is a 57 year old white female,married, PM in for a well woman gyn exam.      Component Value Date/Time   DIAGPAP  09/05/2022 1121    - Negative for Intraepithelial Lesions or Malignancy (NILM)   DIAGPAP - Benign reactive/reparative changes 09/05/2022 1121   DIAGPAP  08/12/2019 1438    - Negative for intraepithelial lesion or malignancy (NILM)   HPVHIGH Negative 09/05/2022 1121   HPVHIGH Negative 08/12/2019 1438   ADEQPAP  09/05/2022 1121    Satisfactory for evaluation; transformation zone component PRESENT.   ADEQPAP  08/12/2019 1438    Satisfactory for evaluation; transformation zone component PRESENT.    PCP is Dr Gwendolyn Flores   Current Medications, Allergies, Past Medical History, Past Surgical History, Family History and Social History were reviewed in [employer] electronic medical record.     Review of Systems: Patient denies any headaches, hearing loss, fatigue, blurred vision, shortness of breath, chest pain, abdominal pain, problems with bowel movements, urination, or intercourse(not currently active). No joint pain or mood swings.  Denies any vaginal bleeding  Has bloating on and off.   Physical Exam:BP 104/67 (BP Location: Left Arm, Patient Position: Sitting, Cuff Size: Normal)   Pulse 84   Ht 5\' 5"  (1.651 m)   Wt 139 lb (63 kg)   LMP 08/12/2016 Comment: GYN - [place]  BMI 23.13 kg/m   General:  Well developed, well nourished, no acute distress Skin:  Warm and dry Neck:  Midline trachea,  thyroid surgically absent, good ROM, no lymphadenopathy Lungs; Clear to auscultation bilaterally Breast:  No dominant palpable mass, retraction, or nipple discharge Cardiovascular: Regular rate and rhythm Abdomen:  Soft, non tender, no hepatosplenomegaly Pelvic:  External genitalia is normal in appearance, no lesions.  The vagina is normal in appearance. Urethra has  no lesions or masses. The cervix is smooth.  Uterus is felt to be normal size, shape, and contour.  No adnexal masses or tenderness noted.Bladder is non tender, no masses felt. Rectal: Good sphincter tone, no polyps, + hemorrhoids felt.  Hemoccult negative.+rectocele Extremities/musculoskeletal:  No swelling or varicosities noted, no clubbing or cyanosis Psych:  No mood changes, alert and cooperative,seems happy AA is 2 Fall risk is low    09/25/2023    2:36 PM 09/05/2022   10:46 AM 06/27/2022    9:07 AM  Depression screen PHQ 2/9  Decreased Interest 0 0 0  Down, Depressed, Hopeless 0 0 0  PHQ - 2 Score 0 0 0  Altered sleeping 1 1 2   Tired, decreased energy 1 1 3   Change in appetite 0 0 0  Feeling bad or failure about yourself  0 0 0  Trouble concentrating 0 0 0  Moving slowly or fidgety/restless 0 0 0  Suicidal thoughts 0 0 0  PHQ-9 Score 2 2 5   Difficult doing work/chores   Somewhat difficult       09/25/2023    2:36 PM 09/05/2022   10:47 AM 08/16/2021    1:40 PM 08/14/2020   12:57 PM  GAD 7 : Generalized Anxiety Score  Nervous, Anxious, on Edge 1 0 0 1  Control/stop worrying 0 0 0 0  Worry too much - different things 0 0 0 1  Trouble relaxing 1 1 0 1  Restless 0 0 0 0  Easily annoyed or irritable 0 0 0 1  Afraid -  awful might happen 0 0 0 0  Total GAD 7 Score 2 1 0 4    Upstream - 09/25/23 1426       Pregnancy Intention Screening   Does the patient want to become pregnant in the next year? No    Does the patient's partner want to become pregnant in the next year? No    Would the patient like to discuss contraceptive options today? No      Contraception Wrap Up   Current Method Abstinence;Vasectomy    End Method Abstinence;Vasectomy    Contraception Counseling Provided No              Examination chaperoned by Malachy Mood LPN  Impression and Plan: 1. Encounter for well woman exam with routine gynecological exam (Primary) Physical in 1 year Pap in  2026 Mammogram was negative 01/02/23 Colonoscopy per [facility] with PCP  2. Encounter for screening fecal occult blood testing Hemoccult was negative  - POCT occult blood stool  3. Rectocele  4. Hemorrhoids, unspecified hemorrhoid type   5. History of breast cancer  6. History of thyroid cancer

## 2023-10-03 ENCOUNTER — Telehealth: Payer: Self-pay | Admitting: Internal Medicine

## 2023-10-03 NOTE — Telephone Encounter (Signed)
 LVM for pt to call back- she is due a physical or can schedule a follow up.  **If pt has a new pcp, please remove Ardyth Harps as pt's pcp.

## 2023-10-30 ENCOUNTER — Other Ambulatory Visit (HOSPITAL_COMMUNITY): Payer: Self-pay

## 2023-10-30 ENCOUNTER — Encounter: Payer: Self-pay | Admitting: Internal Medicine

## 2023-10-30 ENCOUNTER — Ambulatory Visit: Payer: 59 | Admitting: Internal Medicine

## 2023-10-30 VITALS — BP 100/68 | HR 70 | Temp 98.5°F | Ht 65.0 in | Wt 138.7 lb

## 2023-10-30 DIAGNOSIS — Z780 Asymptomatic menopausal state: Secondary | ICD-10-CM | POA: Diagnosis not present

## 2023-10-30 DIAGNOSIS — Z85850 Personal history of malignant neoplasm of thyroid: Secondary | ICD-10-CM | POA: Diagnosis not present

## 2023-10-30 DIAGNOSIS — K581 Irritable bowel syndrome with constipation: Secondary | ICD-10-CM | POA: Diagnosis not present

## 2023-10-30 DIAGNOSIS — E782 Mixed hyperlipidemia: Secondary | ICD-10-CM | POA: Diagnosis not present

## 2023-10-30 DIAGNOSIS — Z114 Encounter for screening for human immunodeficiency virus [HIV]: Secondary | ICD-10-CM | POA: Diagnosis not present

## 2023-10-30 DIAGNOSIS — Z13820 Encounter for screening for osteoporosis: Secondary | ICD-10-CM | POA: Diagnosis not present

## 2023-10-30 DIAGNOSIS — Z0000 Encounter for general adult medical examination without abnormal findings: Secondary | ICD-10-CM | POA: Diagnosis not present

## 2023-10-30 DIAGNOSIS — Z1159 Encounter for screening for other viral diseases: Secondary | ICD-10-CM | POA: Diagnosis not present

## 2023-10-30 LAB — COMPREHENSIVE METABOLIC PANEL
ALT: 24 U/L (ref 0–35)
AST: 27 U/L (ref 0–37)
Albumin: 4.5 g/dL (ref 3.5–5.2)
Alkaline Phosphatase: 62 U/L (ref 39–117)
BUN: 13 mg/dL (ref 6–23)
CO2: 26 meq/L (ref 19–32)
Calcium: 9.4 mg/dL (ref 8.4–10.5)
Chloride: 103 meq/L (ref 96–112)
Creatinine, Ser: 0.9 mg/dL (ref 0.40–1.20)
GFR: 71.97 mL/min (ref >60.00)
Glucose, Bld: 77 mg/dL (ref 70–99)
Potassium: 3.4 meq/L — ABNORMAL LOW (ref 3.5–5.1)
Sodium: 138 meq/L (ref 135–145)
Total Bilirubin: 0.6 mg/dL (ref 0.2–1.2)
Total Protein: 7.4 g/dL (ref 6.0–8.3)

## 2023-10-30 LAB — CBC WITH DIFFERENTIAL/PLATELET
Basophils Absolute: 0 10*3/uL (ref 0.0–0.1)
Basophils Relative: 0.5 % (ref 0.0–3.0)
Eosinophils Absolute: 0.1 10*3/uL (ref 0.0–0.7)
Eosinophils Relative: 1.5 % (ref 0.0–5.0)
HCT: 43.3 % (ref 36.0–46.0)
Hemoglobin: 14.4 g/dL (ref 12.0–15.0)
Lymphocytes Relative: 43.2 % (ref 12.0–46.0)
Lymphs Abs: 2.1 10*3/uL (ref 0.7–4.0)
MCHC: 33.2 g/dL (ref 30.0–36.0)
MCV: 97.2 fL (ref 78.0–100.0)
Monocytes Absolute: 0.4 10*3/uL (ref 0.1–1.0)
Monocytes Relative: 7.7 % (ref 3.0–12.0)
Neutro Abs: 2.3 10*3/uL (ref 1.4–7.7)
Neutrophils Relative %: 47.1 % (ref 43.0–77.0)
Platelets: 259 10*3/uL (ref 150.0–400.0)
RBC: 4.45 Mil/uL (ref 3.87–5.11)
RDW: 13.1 % (ref 11.5–15.5)
WBC: 4.9 10*3/uL (ref 4.0–10.5)

## 2023-10-30 LAB — LIPID PANEL
Cholesterol: 246 mg/dL — ABNORMAL HIGH (ref 0–200)
HDL: 72.6 mg/dL (ref >39.00)
LDL Cholesterol: 153 mg/dL — ABNORMAL HIGH (ref 0–99)
NonHDL: 173.26
Total CHOL/HDL Ratio: 3
Triglycerides: 100 mg/dL (ref 0.0–149.0)
VLDL: 20 mg/dL (ref 0.0–40.0)

## 2023-10-30 LAB — TSH: TSH: 0.74 u[IU]/mL (ref 0.35–5.50)

## 2023-10-30 LAB — VITAMIN B12: Vitamin B-12: 273 pg/mL (ref 211–911)

## 2023-10-30 LAB — VITAMIN D 25 HYDROXY (VIT D DEFICIENCY, FRACTURES): VITD: 49.03 ng/mL (ref 30.00–100.00)

## 2023-10-30 MED ORDER — DICYCLOMINE HCL 10 MG PO CAPS
10.0000 mg | ORAL_CAPSULE | Freq: Three times a day (TID) | ORAL | 0 refills | Status: AC
  Filled 2023-10-30: qty 120, 30d supply, fill #0

## 2023-10-30 NOTE — Progress Notes (Signed)
Established Patient Office Visit     CC/Reason for Visit: Annual preventive exam  HPI: Gwendolyn Flores is a 58 y.o. female who is coming in today for the above mentioned reasons. Past Medical History is significant for: Ductal carcinoma in situ.  She is doing well and still follows with oncology routinely.  She has IBS and is requesting a prescription of Bentyl to take as needed for abdominal bloating.  She is requesting a repeat bone density as her previous showed osteopenia.  She is due for second shingles vaccination.  Has routine eye and dental care.   Past Medical/Surgical History: Past Medical History:  Diagnosis Date   Arthritis    Breast cancer (HCC) 2021   Cancer [facility])    thyroid cancer   Chicken pox    Family history of breast cancer    Family history of colon cancer    Family history of stomach cancer    Family history of thyroid cancer    Headache    High cholesterol    History of thyroid cancer 06/28/2013   Rectocele 06/28/2013   Thyroid disease    cancer   Urinary tract infection     Past Surgical History:  Procedure Laterality Date   EYE SURGERY  11/2019   PARTIAL MASTECTOMY WITH NEEDLE LOCALIZATION AND AXILLARY SENTINEL LYMPH NODE BX Left 02/03/2020   Procedure: PARTIAL MASTECTOMY WITH NEEDLE LOCALIZATION AND AXILLARY SENTINEL LYMPH NODE BX;  Surgeon: Lucretia Roers, MD;  Location: [facility];  Service: General;  Laterality: Left;   THYROIDECTOMY     TONSILECTOMY, ADENOIDECTOMY, BILATERAL MYRINGOTOMY AND TUBES     No ear surgery    Social History:  reports that she has never smoked. She has never used smokeless tobacco. She reports current alcohol use. She reports that she does not use drugs.  Allergies: Allergies  Allergen Reactions   Codeine Nausea And Vomiting   Levaquin [Levofloxacin In D5w] Other (See Comments)    A fib; abnormal heart rhythm   Sulfa Antibiotics Nausea And Vomiting    Family History:  Family History  Problem Relation  Age of Onset   Heart disease Father        heart attack   Diabetes Brother    Stomach cancer Paternal Grandfather 45   Congestive Heart Failure Paternal Grandmother    Dementia Maternal Grandmother    ALS Maternal Grandfather    Thyroid cancer Other        dx. >50 (maternal grandmother's sister)   Stroke Maternal Uncle    Breast cancer Cousin        dx. in her 54s (paternal cousin)   Hydrocephalus Paternal Uncle        "special needs"   Colon cancer Maternal Great-grandmother 35       maternal grandmother's mother   Migraines Neg Hx      Current Outpatient Medications:    Ascorbic Acid (VITAMIN C) 1000 MG tablet, Take 1,000 mg by mouth daily., Disp: , Rfl:    aspirin-acetaminophen-caffeine (EXCEDRIN MIGRAINE) 250-250-65 MG tablet, Take 1 tablet by mouth every 6 (six) hours as needed for headache. , Disp: , Rfl:    betamethasone dipropionate 0.05 % cream, Apply 1 application  topically tothe affected area up to 2 (two) times daily as needed. Not to face,groin or underarms, Disp: 60 g, Rfl: 3   Calcium-Magnesium-Vitamin D (CALCIUM MAGNESIUM PO), Take 3 tablets by mouth daily., Disp: , Rfl:    dicyclomine (BENTYL) 10 MG  capsule, Take 1 capsule (10 mg total) by mouth 4 (four) times daily -  before meals and at bedtime., Disp: 120 capsule, Rfl: 0   Galcanezumab-gnlm (EMGALITY) 120 MG/ML SOAJ, Inject 1 mL into the skin every 30 (thirty) days., Disp: 3 mL, Rfl: 11   hydrocortisone (ANUSOL-HC) 2.5 % rectal cream, Place 1 Application rectally 4 (four) times daily., Disp: 30 g, Rfl: 1   ibuprofen (ADVIL,MOTRIN) 200 MG tablet, Take 400 mg by mouth every 6 (six) hours as needed for moderate pain. , Disp: , Rfl:    Magnesium Gluconate 550 MG TABS, Take 1 tablet twice a day, Disp: , Rfl:    SUMAtriptan (IMITREX) 100 MG tablet, Take 1 tablet (100 mg total) by mouth every 2 (two) hours as needed for migraine, Disp: 9 tablet, Rfl: 11   SYNTHROID 100 MCG tablet, Take 1 tablet 5 days a week and just 1/2  tablet on Saturday and Sunday at bedtime on empty stomach for thyroid, Disp: 72 tablet, Rfl: 4   VITAMIN D PO, Take 1 tablet by mouth daily. , Disp: , Rfl:   Review of Systems:  Negative unless indicated in HPI.   Physical Exam: Vitals:   10/30/23 1114  BP: 100/68  Pulse: 70  Temp: 98.5 F (36.9 C)  TempSrc: Oral  SpO2: 98%  Weight: 138 lb 11.2 oz (62.9 kg)  Height: 5\' 5"  (1.651 m)    Body mass index is 23.08 kg/m.   Physical Exam Vitals reviewed.  Constitutional:      General: She is not in acute distress.    Appearance: Normal appearance. She is not ill-appearing, toxic-appearing or diaphoretic.  HENT:     Head: Normocephalic.     Right Ear: Tympanic membrane, ear canal and external ear normal. There is no impacted cerumen.     Left Ear: Tympanic membrane, ear canal and external ear normal. There is no impacted cerumen.     Nose: Nose normal.     Mouth/Throat:     Mouth: Mucous membranes are moist.     Pharynx: Oropharynx is clear. No oropharyngeal exudate or posterior oropharyngeal erythema.  Eyes:     General: No scleral icterus.       Right eye: No discharge.        Left eye: No discharge.     Conjunctiva/sclera: Conjunctivae normal.     Pupils: Pupils are equal, round, and reactive to light.  Neck:     Vascular: No carotid bruit.  Cardiovascular:     Rate and Rhythm: Normal rate and regular rhythm.     Pulses: Normal pulses.     Heart sounds: Normal heart sounds.  Pulmonary:     Effort: Pulmonary effort is normal. No respiratory distress.     Breath sounds: Normal breath sounds.  Abdominal:     General: Abdomen is flat. Bowel sounds are normal.     Palpations: Abdomen is soft.  Musculoskeletal:        General: Normal range of motion.     Cervical back: Normal range of motion.  Skin:    General: Skin is warm and dry.  Neurological:     General: No focal deficit present.     Mental Status: She is alert and oriented to person, place, and time. Mental  status is at baseline.  Psychiatric:        Mood and Affect: Mood normal.        Behavior: Behavior normal.        Thought  Content: Thought content normal.        Judgment: Judgment normal.     Flowsheet Row Office Visit from 10/30/2023 in [facility] [place]  PHQ-9 Total Score 2        Impression and Plan:  Mixed hyperlipidemia -     Lipid panel; Future  Screening for osteoporosis  Encounter for preventive health examination -     CBC with Differential/Platelet; Future -     Comprehensive metabolic panel; Future  Postmenopausal estrogen deficiency -     DG Bone Density; Future  Encounter for hepatitis C screening test for low risk patient -     Hepatitis C antibody; Future  Encounter for screening for HIV -     HIV Antibody (routine testing w rflx); Future  History of thyroid cancer -     TSH; Future -     Vitamin B12; Future -     VITAMIN D 25 Hydroxy (Vit-D Deficiency, Fractures); Future  Irritable bowel syndrome with constipation -     Dicyclomine HCl; Take 1 capsule (10 mg total) by mouth 4 (four) times daily -  before meals and at bedtime.  Dispense: 120 capsule; Refill: 0   -Recommend routine eye and dental care. -Healthy lifestyle discussed in detail. -Labs to be updated today. -Prostate cancer screening: N/A Health Maintenance  Topic Date Due   HIV Screening  Never done   Hepatitis C Screening  Never done   COVID-19 Vaccine (4 - 2024-25 season) 05/28/2023   Zoster (Shingles) Vaccine (2 of 2) 01/27/2024*   Mammogram  01/01/2025   Pap with HPV screening  09/06/2027   DTaP/Tdap/Td vaccine (3 - Td or Tdap) 01/10/2028   Colon Cancer Screening  11/12/2028   HPV Vaccine  Aged Out  *Topic was postponed. The date shown is not the original due date.     -Has second shingles vaccine scheduled for March. -All other immunizations and cancer screenings are up-to-date.    Chaya Jan, MD [facility] at  [facility]

## 2023-10-31 ENCOUNTER — Other Ambulatory Visit: Payer: Self-pay | Admitting: Internal Medicine

## 2023-10-31 DIAGNOSIS — Z0000 Encounter for general adult medical examination without abnormal findings: Secondary | ICD-10-CM

## 2023-10-31 LAB — HEPATITIS C ANTIBODY: Hepatitis C Ab: NONREACTIVE

## 2023-10-31 LAB — HIV ANTIBODY (ROUTINE TESTING W REFLEX): HIV 1&2 Ab, 4th Generation: NONREACTIVE

## 2023-11-01 ENCOUNTER — Other Ambulatory Visit: Payer: Self-pay | Admitting: Internal Medicine

## 2023-11-01 ENCOUNTER — Other Ambulatory Visit (HOSPITAL_COMMUNITY): Payer: Self-pay

## 2023-11-01 ENCOUNTER — Encounter: Payer: Self-pay | Admitting: Internal Medicine

## 2023-11-01 DIAGNOSIS — E782 Mixed hyperlipidemia: Secondary | ICD-10-CM

## 2023-11-01 MED ORDER — ROSUVASTATIN CALCIUM 5 MG PO TABS
5.0000 mg | ORAL_TABLET | Freq: Every day | ORAL | 1 refills | Status: DC
  Filled 2023-11-01: qty 90, 90d supply, fill #0
  Filled 2024-01-30: qty 90, 90d supply, fill #1

## 2023-11-17 ENCOUNTER — Other Ambulatory Visit (HOSPITAL_BASED_OUTPATIENT_CLINIC_OR_DEPARTMENT_OTHER): Payer: Self-pay

## 2023-11-17 MED ORDER — ZOSTER VAC RECOMB ADJUVANTED 50 MCG/0.5ML IM SUSR
0.5000 mL | Freq: Once | INTRAMUSCULAR | 0 refills | Status: AC
  Filled 2023-11-17: qty 0.5, 1d supply, fill #0

## 2023-12-04 ENCOUNTER — Other Ambulatory Visit: Payer: Self-pay

## 2024-01-03 ENCOUNTER — Ambulatory Visit
Admission: RE | Admit: 2024-01-03 | Discharge: 2024-01-03 | Disposition: A | Discharge status: Home / Self Care | Payer: 59 | Source: Ambulatory Visit | Attending: Internal Medicine | Admitting: Internal Medicine

## 2024-01-03 DIAGNOSIS — Z1231 Encounter for screening mammogram for malignant neoplasm of breast: Secondary | ICD-10-CM | POA: Diagnosis not present

## 2024-01-03 DIAGNOSIS — Z0000 Encounter for general adult medical examination without abnormal findings: Secondary | ICD-10-CM

## 2024-01-15 ENCOUNTER — Other Ambulatory Visit: Payer: Self-pay

## 2024-01-15 ENCOUNTER — Inpatient Hospital Stay: Payer: 59 | Attending: Hematology and Oncology | Admitting: Hematology and Oncology

## 2024-01-15 VITALS — BP 90/64 | HR 81 | Temp 98.2°F | Resp 18 | Ht 65.0 in | Wt 135.9 lb

## 2024-01-15 DIAGNOSIS — Z79899 Other long term (current) drug therapy: Secondary | ICD-10-CM | POA: Diagnosis not present

## 2024-01-15 DIAGNOSIS — D0512 Intraductal carcinoma in situ of left breast: Secondary | ICD-10-CM | POA: Diagnosis not present

## 2024-01-15 DIAGNOSIS — Z170 Estrogen receptor positive status [ER+]: Secondary | ICD-10-CM | POA: Diagnosis not present

## 2024-01-15 NOTE — Progress Notes (Signed)
 Patient Care Team: Gwendolyn Flores, Charyl Coppersmith, MD as PCP - General (Internal Medicine) Cameron Cea, MD as Consulting Physician (Hematology and Oncology) Johna Myers, MD as Consulting Physician (Radiation Oncology) Awilda Bogus, MD as Consulting Physician (General Surgery)  DIAGNOSIS:  Encounter Diagnosis  Name Primary?   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 Diagnostic mammogram showed a 0.5cm group of calcifications in the upper-outer left breast. Biopsy showed high grade DCIS, ER+ 100%, PR+ 90%.    02/03/2020 Surgery   Left lumpectomy Collene Dawson) [phone]): high grade DCIS, 1.2cm, negative margins, 1 left axillary lymph node negative for carcinoma.   02/03/2020 Cancer Staging   Staging form: Breast, AJCC 8th Edition - Pathologic stage from 02/03/2020: Stage 0 (pTis (DCIS), pN0, cM0, ER+, PR+)   03/09/2020 - 04/06/2020 Radiation Therapy   The patient initially received a dose of 42.56 Gy in 16 fractions to the breast using whole-breast tangent fields. This was delivered using a 3-D conformal technique. The patient then received a boost to the seroma. This delivered an additional 8 Gy in 33fractions using a 3 field photon technique due to the depth of the seroma. The total dose was 50.56 Gy.   03/27/2020 Genetic Testing   Negative genetic testing:  No pathogenic variants detected on the [facility] Common Hereditary Cancers Panel. The report date is 03/27/2020.  The Common Hereditary Cancers Panel offered by [facility] includes sequencing and/or deletion duplication testing of the following 48 genes: APC, ATM, AXIN2, BARD1, BMPR1A, BRCA1, BRCA2, BRIP1, CDH1, CDK4, CDKN2A (p14ARF), CDKN2A (p16INK4a), CHEK2, CTNNA1, DICER1, EPCAM (Deletion/duplication  testing only), GREM1 (promoter region deletion/duplication testing only), KIT, MEN1, MLH1, MSH2, MSH3, MSH6, MUTYH, NBN, NF1, NTHL1, PALB2, PDGFRA, PMS2, POLD1, POLE, PTEN, RAD50, RAD51C, RAD51D, RNF43, SDHB, SDHC, SDHD, SMAD4, SMARCA4. STK11, TP53, TSC1, TSC2, and VHL.  The following genes were evaluated for sequence changes only: SDHA and HOXB13 c.251G>A variant only.    Anti-estrogen oral therapy   Plan to begin Anastrozole  x 5 years after bone density test.     CHIEF COMPLIANT: Follow-up of DCIS surveillance  HISTORY OF PRESENT ILLNESS:   History of Present Illness Gwendolyn Flores, a patient with a history of breast cancer, presents for a routine follow-up. She reports discontinuing anastrozole  after a month due to intolerable side effects, including mental cloudiness and generalized fatigue. She notes a significant improvement in these symptoms after discontinuation of the medication. Karalynn also mentions that she has been started on Crestor  for cholesterol management. She remains active and exercises regularly. She denies any current pain or discomfort. Her most recent mammogram, conducted on April 14th, showed no concerning findings.     ALLERGIES:  is allergic to codeine, levaquin [levofloxacin in d5w], and sulfa antibiotics.  MEDICATIONS:  Current Outpatient Medications  Medication Sig Dispense Refill   Ascorbic Acid (VITAMIN C) 1000 MG tablet Take 1,000 mg by mouth daily.     aspirin-acetaminophen-caffeine (EXCEDRIN MIGRAINE) 250-250-65 MG tablet Take 1 tablet by mouth every 6 (six) hours as needed for headache.      Calcium -Magnesium-Vitamin D  (CALCIUM  MAGNESIUM PO) Take 3 tablets by mouth daily.     Galcanezumab -gnlm (EMGALITY ) 120 MG/ML SOAJ Inject 1 mL into the skin every 30 (thirty) days. 3 mL 11   Magnesium Gluconate 550 MG TABS Take 1 tablet twice a  day     rosuvastatin  (CRESTOR ) 5 MG tablet Take 1 tablet (5 mg total) by mouth daily. 90 tablet 1   SYNTHROID  100 MCG tablet Take 1  tablet 5 days a week and just 1/2 tablet on Saturday and Sunday at bedtime on empty stomach for thyroid  72 tablet 4   VITAMIN D  PO Take 1 tablet by mouth daily.      betamethasone  dipropionate 0.05 % cream Apply 1 application  topically tothe affected area up to 2 (two) times daily as needed. Not to face,groin or underarms (Patient not taking: Reported on 01/15/2024) 60 g 3   dicyclomine  (BENTYL ) 10 MG capsule Take 1 capsule (10 mg total) by mouth 4 (four) times daily -  before meals and at bedtime. (Patient not taking: Reported on 01/15/2024) 120 capsule 0   hydrocortisone  (ANUSOL -HC) 2.5 % rectal cream Place 1 Application rectally 4 (four) times daily. (Patient not taking: Reported on 01/15/2024) 30 g 1   ibuprofen (ADVIL,MOTRIN) 200 MG tablet Take 400 mg by mouth every 6 (six) hours as needed for moderate pain.  (Patient not taking: Reported on 01/15/2024)     SUMAtriptan  (IMITREX ) 100 MG tablet Take 1 tablet (100 mg total) by mouth every 2 (two) hours as needed for migraine (Patient not taking: Reported on 01/15/2024) 9 tablet 11   No current facility-administered medications for this visit.    PHYSICAL EXAMINATION: ECOG PERFORMANCE STATUS: 1 - Symptomatic but completely ambulatory  Vitals:   01/15/24 1130  BP: 90/64  Pulse: 81  Resp: 18  Temp: 98.2 F (36.8 C)  SpO2: 98%   Filed Weights   01/15/24 1130  Weight: 135 lb 14.4 oz (61.6 kg)    Physical Exam   (exam performed in the presence of a chaperone)  LABORATORY DATA:  I have reviewed the data as listed    Latest Ref Rng & Units 10/30/2023   11:41 AM 06/27/2022    9:32 AM 06/25/2021   11:32 AM  CMP  Glucose 70 - 99 mg/dL 77  76  83   BUN 6 - 23 mg/dL 13  20  14    Creatinine 0.40 - 1.20 mg/dL 4.09  8.11  9.14   Sodium 135 - 145 mEq/L 138  138  139   Potassium 3.5 - 5.1 mEq/L 3.4  3.7  4.0   Chloride 96 - 112 mEq/L 103  104  104   CO2 19 - 32 mEq/L 26  27  28    Calcium  8.4 - 10.5 mg/dL 9.4  9.4  9.6   Total Protein 6.0 -  8.3 g/dL 7.4  7.0  7.2   Total Bilirubin 0.2 - 1.2 mg/dL 0.6  0.4  0.5   Alkaline Phos 39 - 117 U/L 62  61  57   AST 0 - 37 U/L 27  34  21   ALT 0 - 35 U/L 24  33  21     Lab Results  Component Value Date   WBC 4.9 10/30/2023   HGB 14.4 10/30/2023   HCT 43.3 10/30/2023   MCV 97.2 10/30/2023   PLT 259.0 10/30/2023   NEUTROABS 2.3 10/30/2023    ASSESSMENT & PLAN:  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 Diagnostic mammogram showed a 0.5cm group of calcifications in the upper-outer left breast. Biopsy showed high grade DCIS, ER+ 100%, PR+ 90%.  02/03/2020: Left lumpectomy: High-grade DCIS 1.2 cm, 1 axillary lymph node negative, margins  negative   Adjuvant radiation 03/10/2020-04/06/2020   Treatment plan: Could not tolerate anastrozole  and after a month it was discontinued (mental cloudiness and generalized fatigue) osteopenia with a T score of -1.5 and elevated cholesterol numbers.       Breast cancer surveillance: 1. mammogram 01/08/2024: Benign breast density category B 2.  Bone density 05/18/2020: T score -1.5: Osteopenia: Calcium  and vitamin D  we will request a new bone density test to be done 06/21/2024 3.  Breast exam 01/15/2024: Benign   Patient's husband Dr. Alanda Allegra is our surgeon at [facility].     Return to clinic in 1 year for follow-up and after that she could be seen on an as-needed basis.     No orders of the defined types were placed in this encounter.  The patient has a good understanding of the overall plan. she agrees with it. she will call with any problems that may develop before the next visit here. Total time spent: 30 mins including face to face time and time spent for planning, charting and co-ordination of care   Viinay K Merilee Wible, MD 01/15/24

## 2024-01-15 NOTE — Assessment & Plan Note (Signed)
 01/29/2020:Screening mammogram showed left breast calcifications. Diagnostic mammogram showed a 0.5cm group of calcifications in the upper-outer left breast. Biopsy showed high grade DCIS, ER+ 100%, PR+ 90%.  02/03/2020: Left lumpectomy: High-grade DCIS 1.2 cm, 1 axillary lymph node negative, margins negative   Adjuvant radiation 03/10/2020-04/06/2020   Treatment plan: Adjuvant antiestrogen therapy with anastrozole  Patient discontinued anastrozole  therapy osteopenia with a T score of -1.5 and elevated cholesterol numbers.   Because of her history of coronary artery disease we decided to use anastrozole  over tamoxifen      Breast cancer surveillance: 1. mammogram 01/08/2024: Benign breast density category B 2.  Bone density 05/18/2020: T score -1.5: Osteopenia: Calcium  and vitamin D  we will request a new bone density test to be done 06/21/2024   Patient's husband Dr. Alanda Allegra is our surgeon at [facility].   She will start tamoxifen  and let us  know if she needs refills. Return to clinic in 1 year for follow-up

## 2024-01-26 ENCOUNTER — Other Ambulatory Visit (INDEPENDENT_AMBULATORY_CARE_PROVIDER_SITE_OTHER)

## 2024-01-26 DIAGNOSIS — E782 Mixed hyperlipidemia: Secondary | ICD-10-CM

## 2024-01-26 LAB — LIPID PANEL
Cholesterol: 188 mg/dL (ref 0–200)
HDL: 64.3 mg/dL (ref >39.00)
LDL Cholesterol: 113 mg/dL — ABNORMAL HIGH (ref 0–99)
NonHDL: 124.05
Total CHOL/HDL Ratio: 3
Triglycerides: 57 mg/dL (ref 0.0–149.0)
VLDL: 11.4 mg/dL (ref 0.0–40.0)

## 2024-01-29 ENCOUNTER — Encounter: Payer: Self-pay | Admitting: Internal Medicine

## 2024-01-30 ENCOUNTER — Other Ambulatory Visit (HOSPITAL_COMMUNITY): Payer: Self-pay

## 2024-02-26 DIAGNOSIS — D225 Melanocytic nevi of trunk: Secondary | ICD-10-CM | POA: Diagnosis not present

## 2024-02-26 DIAGNOSIS — Z1283 Encounter for screening for malignant neoplasm of skin: Secondary | ICD-10-CM | POA: Diagnosis not present

## 2024-02-26 DIAGNOSIS — I821 Thrombophlebitis migrans: Secondary | ICD-10-CM | POA: Diagnosis not present

## 2024-03-01 DIAGNOSIS — E890 Postprocedural hypothyroidism: Secondary | ICD-10-CM | POA: Diagnosis not present

## 2024-03-11 DIAGNOSIS — C73 Malignant neoplasm of thyroid gland: Secondary | ICD-10-CM | POA: Diagnosis not present

## 2024-03-11 DIAGNOSIS — E890 Postprocedural hypothyroidism: Secondary | ICD-10-CM | POA: Diagnosis not present

## 2024-03-18 ENCOUNTER — Other Ambulatory Visit (HOSPITAL_COMMUNITY): Payer: Self-pay

## 2024-04-22 DIAGNOSIS — E890 Postprocedural hypothyroidism: Secondary | ICD-10-CM | POA: Diagnosis not present

## 2024-05-03 ENCOUNTER — Telehealth: Payer: Self-pay | Admitting: Pharmacist

## 2024-05-03 NOTE — Telephone Encounter (Signed)
 Pharmacy Patient Advocate Encounter   Received notification from CoverMyMeds that prior authorization for Emgality  120MG /ML auto-injectors (migraine) is due for renewal.   Insurance verification completed.   The patient is insured through [facility].  Action: Patient hasn't been seen in your office in over a year. Plan requires updated chart notes for PA renewal.

## 2024-05-06 ENCOUNTER — Other Ambulatory Visit: Payer: Self-pay | Admitting: Internal Medicine

## 2024-05-06 ENCOUNTER — Other Ambulatory Visit (HOSPITAL_BASED_OUTPATIENT_CLINIC_OR_DEPARTMENT_OTHER): Payer: Self-pay

## 2024-05-06 ENCOUNTER — Other Ambulatory Visit: Payer: Self-pay

## 2024-05-06 ENCOUNTER — Encounter: Payer: Self-pay | Admitting: Internal Medicine

## 2024-05-06 DIAGNOSIS — G43809 Other migraine, not intractable, without status migrainosus: Secondary | ICD-10-CM

## 2024-05-06 DIAGNOSIS — G43009 Migraine without aura, not intractable, without status migrainosus: Secondary | ICD-10-CM

## 2024-05-06 DIAGNOSIS — E782 Mixed hyperlipidemia: Secondary | ICD-10-CM

## 2024-05-06 MED ORDER — ROSUVASTATIN CALCIUM 5 MG PO TABS
5.0000 mg | ORAL_TABLET | Freq: Every day | ORAL | 1 refills | Status: AC
  Filled 2024-05-06: qty 90, 90d supply, fill #0
  Filled 2024-07-31: qty 90, 90d supply, fill #1

## 2024-05-06 NOTE — Telephone Encounter (Signed)
 Pt was called, message was relayed to her.

## 2024-05-22 ENCOUNTER — Telehealth (INDEPENDENT_AMBULATORY_CARE_PROVIDER_SITE_OTHER): Admitting: Neurology

## 2024-05-22 ENCOUNTER — Other Ambulatory Visit (HOSPITAL_COMMUNITY): Payer: Self-pay

## 2024-05-22 DIAGNOSIS — G43709 Chronic migraine without aura, not intractable, without status migrainosus: Secondary | ICD-10-CM | POA: Diagnosis not present

## 2024-05-22 MED ORDER — EMGALITY 120 MG/ML ~~LOC~~ SOAJ
1.0000 mL | SUBCUTANEOUS | 11 refills | Status: AC
  Filled 2024-05-22 – 2024-06-11 (×2): qty 3, 90d supply, fill #0
  Filled 2024-09-15: qty 3, 90d supply, fill #1

## 2024-05-22 MED ORDER — SUMATRIPTAN SUCCINATE 100 MG PO TABS
100.0000 mg | ORAL_TABLET | ORAL | 11 refills | Status: AC | PRN
  Filled 2024-05-22: qty 9, 30d supply, fill #0

## 2024-05-22 NOTE — Progress Notes (Signed)
 [facility]    Provider:  Dr Flores Referring Provider: Theophilus Andrews, Jonna* Primary Care Physician:  Theophilus Andrews, Tully GRADE, MD   CC:  Vestibular migraines  Virtual Visit via Telephone Note  I connected with Gwendolyn Flores on 05/22/2024 at  4:00 PM EDT by telephone and verified that I am speaking with the correct person using two identifiers.  Location: Patient: Home Provider: Office   I discussed the limitations, risks, security and privacy concerns of performing an evaluation and management service by telephone and the availability of in person appointments. I also discussed with the patient that there may be a patient responsible charge related to this service. The patient expressed understanding and agreed to proceed.   Follow Up Instructions:    I discussed the assessment and treatment plan with the patient. The patient was provided an opportunity to ask questions and all were answered. The patient agreed with the plan and demonstrated an understanding of the instructions.   The patient was advised to call back or seek an in-person evaluation if the symptoms worsen or if the condition fails to improve as anticipated.  I provided 10 minutes of non-face-to-face time during this encounter.   Gwendolyn KATHEE Ines, MD  05/22/2024:  she is doing well on emgaity and imitrex , continue.  Interval history 08/05/2019: Botox extremely beneficial. She was having 15 headache days a month with 8 severe migraines but since we started botox she is extremely better > 50% improvement. Imitrex  works well. Emgality  has really helped. She is doing well right now. Trokendi  titrated up to 50mg  and helped but the hair loss. Could try Qudexy , or zonegran. She did not see a difference with the Ajovy vs emgality  ve aimovig, Over the last several months she has been having about 4 headaches a month, in August she had some vertigo and 3 headache days and one migraine, ongoing at this  improved frequency. But in September had 9 headache days and 2 migraine days oral or Tosymra  she likes it. October 6 headache days and 0 migraines. The headaches are more tension tyoe, tensionin the temporal lobes always sometimes in the back. Dry needling helped but she sees a Land.  Lightly in the cervical spinal muscles last time she felt weak. She thinks she has fibromyalgia,   Interval history 08/03/2018: Imtrex works well acutely.  Migraines usually last a day and 20 headache days a month and 12 migraine days. Vertigo has improved. Throbbing behind the eyes, +nausea, light and sound sensitivity, a dark room helps. No medication overuse. No aura.   Interval history 06/05/2017: She takes Treximet , still helping. Over the last 3 months she has had a few spells of vertigo when changing positions, she had it for about a week and 2 spells.  Other than that no vertigo. She went through menopause. Her migraines are not that often. She has a nagging headache weekly. Her migraines are behind the eyes and in the temples and someimtes in the top, just pain unsure how to describe it, she gets nausea, no vomiting, + light sensitivity, no sound sensitivity. The weekly headache responds to motrin. She takes treximet  when she gets dizzy. Last migraine February but since then she has weekly headaches and episodes of dizziness. The weekly headache    Interval History 05/02/2016: No side effects since the medication. She has only had 2 dizzy spells. Occ when she turns to the left in the morning she gets dizzy. She has taken the Treximet  twice since  going up to 50mg  Trokendi  qhs. Her husband is general surgery employed by [employer]. Daughter is 16. She has a daughter who is 95 and a son who is 30. SHe feels great andis very happy. Discussed we will stay at current dose with treximet  prn.    MRI of the brain was normal.   Interval History: 58 year old female with vestibular migraines. She tried treximet  and it worked. She  took it at 7am. It worked and no side effects. She started the Trokendi  on Friday. She started at 25mg . Her neck is better. The positional vertigo is better. Since last being seen she has headaches 2-3x a month. She had one bad one as above and Treximet  helped. Since February 20th has had the positional vertigo constantly. Few times had the headache mostly behind the eys and in the temple. Vestibular therapy.      HPI:  Gwendolyn Flores is a 58 y.o. female here as a referral from Dr. Vonzell for headaches. 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 Headaches started in 2010 with nausea and dizziness. Lasted for over a day. Didn't have any again until 2013 but now getting more frequent and more severe. The dizziness occurs with headaches. The headaches are behind the eyes and in the temples and someimtes in the top, just pain unsure how to describe it, she gets nausea, no vomiting, + light sensitivity, no sound sensitivity. She takes excedrin migraine. She takes it sporadically some weeks 3-4x others none. At least 2x a week it gets bad enough to take excedrin. Last month had 15 headaches a month, this month only one. Off balance like on a boat. She has had room spinning occ when laying down but more off balance. No other associated symptoms. No inciting events. No focal neurologic deficits.  She provides a headache diary today with details that the dizziness started back in June 2015 but the headaches did not start until August 2016. When the headaches began, the episodes of headache with dizziness increased with the worst month being   Reviewed notes, labs and imaging from outside physicians, which showed;   Reviewed log that patient brought in. Patient is tried Antivert or Valium 2 mg every 6-8 hours for her dizziness. Effexor XR was not effective. First spell was in December 2010 when she  was traveling to [facility] . Stopped at [employer] that were set night in the next morning. Had nausea. No vomiting. Lasted over a week. Had a small spell early in 2013. Second big spell June 2013 at the beach. Got worse that day and the next. Had nausea lasted 3-4 days. The next spell was 08/29/2012 at work. Worse at night in the next morning. She had an upset stomach. Lasted a day.   February 2014: Dizziness for 2 days worse with laying down March 2013: 1 day of dizziness, upon waking April 2014 slight dizziness on laying down April 2015 slight dizziness one day. Better with caffeine. 02/2014 dizziness for 2 days October 2015: 5 days of dizzy spells and nausea. Onset of headaches. June 2016: 1 episode of dizzy spell and nausea July 2016: She woke up with dizziness that lasted 3 weeks. August 2016: 9 days of headache and dizziness September 2016 3 days of headache and dizziness October 2016: 1 episode of dizziness November 2016: 13 headache days with dizziness. December 2016: 1 episode of headache   [facility]ed [facility] from [facility].  Stated she is having headache and dizziness with increasing frequency. No change in her hearing although she does have occasional clicking in her left ear.   Review of Systems: Patient complains of symptoms per HPI as well as the following symptoms: Eye pain, hearing changes in left ear, headache, dizziness, anxiety. Pertinent negatives per HPI. All others negative.       Social History   Socioeconomic History   Marital status: Married    Spouse name: Oneil    Number of children: 2   Years of education: 16   Highest education level: Not on file  Occupational History   Occupation: Charity fundraiser  Tobacco Use   Smoking status: Never   Smokeless tobacco: Never  Vaping Use   Vaping status: Never Used  Substance and Sexual Activity   Alcohol use: Yes    Comment: wine socially   Drug use: No   Sexual activity: Not Currently    Birth  control/protection: Other-see comments, Surgical, Post-menopausal    Comment: vasectomy  Other Topics Concern   Not on file  Social History Narrative   Lives at home with husband and children.   Caffeine use: 1 cup per day   Right handed   Social Drivers of Health   Financial Resource Strain: Low Risk  (09/25/2023)   Overall Financial Resource Strain (CARDIA)    Difficulty of Paying Living Expenses: Not hard at all  Food Insecurity: No Food Insecurity (09/25/2023)   Hunger Vital Sign    Worried About Running Out of Food in the Last Year: Never true    Ran Out of Food in the Last Year: Never true  Transportation Needs: No Transportation Needs (09/25/2023)   PRAPARE - Administrator, Civil Service (Medical): No    Lack of Transportation (Non-Medical): No  Physical Activity: Sufficiently Active (09/25/2023)   Exercise Vital Sign    Days of Exercise per Week: 4 days    Minutes of Exercise per Session: 40 min  Stress: No Stress Concern Present (09/25/2023)   [employer] of Occupational Health - Occupational Stress Questionnaire    Feeling of Stress : Only a little  Social Connections: Socially Integrated (09/25/2023)   Social Connection and Isolation Panel    Frequency of Communication with Friends and Family: More than three times a week    Frequency of Social Gatherings with Friends and Family: Three times a week    Attends Religious Services: More than 4 times per year    Active Member of Clubs or Organizations: Yes    Attends Banker Meetings: 1 to 4 times per year    Marital Status: Married  Catering manager Violence: Not At Risk (09/25/2023)   Humiliation, Afraid, Rape, and Kick questionnaire    Fear of Current or Ex-Partner: No    Emotionally Abused: No    Physically Abused: No    Sexually Abused: No    Family History  Problem Relation Age of Onset   Heart disease Father        heart attack   Diabetes Brother    Stomach cancer Paternal  Grandfather 43   Congestive Heart Failure Paternal Grandmother    Dementia Maternal Grandmother    ALS Maternal Grandfather    Thyroid  cancer Other        dx. >50 (maternal grandmother's sister)   Stroke Maternal Uncle    Breast cancer Cousin        dx. in her 75s (paternal cousin)   Hydrocephalus  Paternal Uncle        special needs   Colon cancer Maternal Great-grandmother 48       maternal grandmother's mother   Migraines Neg Hx     Past Medical History:  Diagnosis Date   Arthritis    Breast cancer (HCC) 2021   Cancer (HCC)    thyroid  cancer   Chicken pox    Family history of breast cancer    Family history of colon cancer    Family history of stomach cancer    Family history of thyroid  cancer    Headache    High cholesterol    History of thyroid  cancer 06/28/2013   Rectocele 06/28/2013   Thyroid  disease    cancer   Urinary tract infection     Past Surgical History:  Procedure Laterality Date   EYE SURGERY  11/2019   PARTIAL MASTECTOMY WITH NEEDLE LOCALIZATION AND AXILLARY SENTINEL LYMPH NODE BX Left 02/03/2020   Procedure: PARTIAL MASTECTOMY WITH NEEDLE LOCALIZATION AND AXILLARY SENTINEL LYMPH NODE BX;  Surgeon: Kallie Manuelita BROCKS, MD;  Location: [facility];  Service: General;  Laterality: Left;   THYROIDECTOMY     TONSILECTOMY, ADENOIDECTOMY, BILATERAL MYRINGOTOMY AND TUBES     No ear surgery    Current Outpatient Medications  Medication Sig Dispense Refill   Ascorbic Acid (VITAMIN C) 1000 MG tablet Take 1,000 mg by mouth daily.     aspirin-acetaminophen-caffeine (EXCEDRIN MIGRAINE) 250-250-65 MG tablet Take 1 tablet by mouth every 6 (six) hours as needed for headache.      betamethasone  dipropionate 0.05 % cream Apply 1 application  topically tothe affected area up to 2 (two) times daily as needed. Not to face,groin or underarms (Patient not taking: Reported on 01/15/2024) 60 g 3   Calcium -Magnesium-Vitamin D  (CALCIUM  MAGNESIUM PO) Take 3 tablets by mouth daily.      dicyclomine  (BENTYL ) 10 MG capsule Take 1 capsule (10 mg total) by mouth 4 (four) times daily -  before meals and at bedtime. (Patient not taking: Reported on 01/15/2024) 120 capsule 0   Galcanezumab -gnlm (EMGALITY ) 120 MG/ML SOAJ Inject 1 mL into the skin every 30 (thirty) days. Please use copay card: BIN [id number] PCN PDMI GRP [id number] ID [id number] EXP 09/25/2024 3 mL 11   hydrocortisone  (ANUSOL -HC) 2.5 % rectal cream Place 1 Application rectally 4 (four) times daily. (Patient not taking: Reported on 01/15/2024) 30 g 1   ibuprofen (ADVIL,MOTRIN) 200 MG tablet Take 400 mg by mouth every 6 (six) hours as needed for moderate pain.  (Patient not taking: Reported on 01/15/2024)     Magnesium Gluconate 550 MG TABS Take 1 tablet twice a day     rosuvastatin  (CRESTOR ) 5 MG tablet Take 1 tablet (5 mg total) by mouth daily. 90 tablet 1   SUMAtriptan  (IMITREX ) 100 MG tablet Take 1 tablet (100 mg total) by mouth every 2 (two) hours as needed for migraine 12 tablet 11   SYNTHROID  100 MCG tablet Take 1 tablet 5 days a week and just 1/2 tablet on Saturday and Sunday at bedtime on empty stomach for thyroid  72 tablet 4   VITAMIN D  PO Take 1 tablet by mouth daily.      No current facility-administered medications for this visit.    Allergies as of 05/22/2024 - Review Complete 01/15/2024  Allergen Reaction Noted   Codeine Nausea And Vomiting 06/28/2013   Levaquin [levofloxacin in d5w] Other (See Comments) 10/05/2015   Sulfa antibiotics Nausea And Vomiting 06/28/2013    Vitals:  LMP 08/12/2016 Comment: G[facility] - [facility] Last Weight:  Wt Readings from Last 1 Encounters:  01/15/24 135 lb 14.4 oz (61.6 kg)   Last Height:   Ht Readings from Last 1 Encounters:  01/15/24 5' 5 (1.651 m)    Physical exam: Exam: Gen: NAD, conversant      CV: No palpitations or chest pain or SOB. VS: Breathing at a normal rate. Weight appears within normal limits. Not febrile. Eyes: Conjunctivae clear without exudates or  hemorrhage  Neuro: Detailed Neurologic Exam  Speech:    Speech is normal; fluent and spontaneous with normal comprehension.  Cognition:    The patient is oriented to person, place, and time;     recent and remote memory intact;     language fluent;     normal attention, concentration, fund of knowledge Cranial Nerves:    The pupils are equal, round, and reactive to light. Visual fields are full Extraocular movements are intact.  The face is symmetric with normal sensation. The palate elevates in the midline. Hearing intact. Voice is normal. Shoulder shrug is normal. The tongue has normal motion without fasciculations.   Coordination: normal  Gait:    No abnormalities noted or reported  Motor Observation:   no involuntary movements noted. Tone:    Appears normal  Posture:    Posture is normal. normal erect    Strength:    Strength is anti-gravity and symmetric in the upper and lower limbs.      Sensation: intact to LT, no reports of numbness or tingling or paresthesias             Assessment/Plan:  58 year old female chronic migraines doing exceptionally well on botox  she is doing well on emgaity and imitrex , continue.  Meds ordered this encounter  Medications   Galcanezumab -gnlm (EMGALITY ) 120 MG/ML SOAJ    Sig: Inject 1 mL into the skin every 30 (thirty) days. Please use copay card: BIN [id number] PCN PDMI GRP [id number] ID [id number] EXP 09/25/2024    Dispense:  3 mL    Refill:  11    Please dispense a 35-month supply of possible if not dispense monthly thanks; Please use copay card: BIN [id number] PCN PDMI GRP [id number] ID [id number] EXP 09/25/2024   SUMAtriptan  (IMITREX ) 100 MG tablet    Sig: Take 1 tablet (100 mg total) by mouth every 2 (two) hours as needed for migraine    Dispense:  12 tablet    Refill:  11     - Discussion with patient regarding migraine triggers and behavioral modifications that can help prevent migraines. Provided information sheet with  common migraine triggers including foods to avoid.  As far as diagnostic testing: MRi of the brain was normal   Discussed: To prevent or relieve headaches, try the following: Cool Compress. Lie down and place a cool compress on your head.  Avoid headache triggers. If certain foods or odors seem to have triggered your migraines in the past, avoid them. A headache diary might help you identify triggers.  Include physical activity in your daily routine. Try a daily walk or other moderate aerobic exercise.  Manage stress. Find healthy ways to cope with the stressors, such as delegating tasks on your to-do list.  Practice relaxation techniques. Try deep breathing, yoga, massage and visualization.  Eat regularly. Eating regularly scheduled meals and maintaining a healthy diet might help prevent headaches. Also, drink plenty of fluids.  Follow a regular sleep schedule. Sleep deprivation might contribute to headaches Consider  biofeedback. With this mind-body technique, you learn to control certain bodily functions -- such as muscle tension, heart rate and blood pressure -- to prevent headaches or reduce headache pain.    Proceed to emergency room if you experience new or worsening symptoms or symptoms do not resolve, if you have new neurologic symptoms or if headache is severe, or for any concerning symptom.   Provided education and documentation from American headache Society toolbox including articles on: chronic migraine medication overuse headache, chronic migraines, prevention of migraines, behavioral and other nonpharmacologic treatments for headache.   Gwendolyn Epp, MD  [facility] [address] [place], [place] [address]  Phone [phone] Fax [phone]

## 2024-05-27 ENCOUNTER — Other Ambulatory Visit (HOSPITAL_COMMUNITY): Payer: Self-pay

## 2024-05-28 ENCOUNTER — Telehealth: Payer: Self-pay

## 2024-05-28 ENCOUNTER — Other Ambulatory Visit (HOSPITAL_COMMUNITY): Payer: Self-pay

## 2024-05-28 NOTE — Telephone Encounter (Signed)
 Pharmacy Patient Advocate Encounter  Received notification from [facility] that Prior Authorization for Emgality  has been APPROVED from 05/28/2024 to 05/27/2025   PA #/Case ID/Reference #: [id number]

## 2024-05-28 NOTE — Telephone Encounter (Signed)
 Pharmacy Patient Advocate Encounter   Received notification from CoverMyMeds that prior authorization for Emgality  is required/requested.   Insurance verification completed.   The patient is insured through [facility] .   Per test claim: PA required; PA submitted to above mentioned insurance via Latent Key/confirmation #/EOC [id number] Status is pending

## 2024-05-30 ENCOUNTER — Other Ambulatory Visit: Payer: Self-pay

## 2024-05-30 ENCOUNTER — Other Ambulatory Visit (HOSPITAL_COMMUNITY): Payer: Self-pay

## 2024-05-30 MED ORDER — BETAMETHASONE DIPROPIONATE 0.05 % EX CREA
TOPICAL_CREAM | CUTANEOUS | 3 refills | Status: AC
  Filled 2024-05-30 – 2024-05-31 (×2): qty 60, 30d supply, fill #0

## 2024-05-31 ENCOUNTER — Other Ambulatory Visit (HOSPITAL_COMMUNITY): Payer: Self-pay

## 2024-06-11 ENCOUNTER — Other Ambulatory Visit: Payer: Self-pay

## 2024-06-11 ENCOUNTER — Other Ambulatory Visit (HOSPITAL_COMMUNITY): Payer: Self-pay

## 2024-06-12 ENCOUNTER — Other Ambulatory Visit: Payer: Self-pay

## 2024-06-12 ENCOUNTER — Other Ambulatory Visit (HOSPITAL_COMMUNITY): Payer: Self-pay

## 2024-06-14 ENCOUNTER — Other Ambulatory Visit: Payer: Self-pay

## 2024-06-14 ENCOUNTER — Other Ambulatory Visit (HOSPITAL_COMMUNITY): Payer: Self-pay

## 2024-06-17 DIAGNOSIS — H903 Sensorineural hearing loss, bilateral: Secondary | ICD-10-CM | POA: Diagnosis not present

## 2024-06-21 ENCOUNTER — Other Ambulatory Visit: Payer: 59

## 2024-07-01 DIAGNOSIS — D225 Melanocytic nevi of trunk: Secondary | ICD-10-CM | POA: Diagnosis not present

## 2024-07-01 DIAGNOSIS — D485 Neoplasm of uncertain behavior of skin: Secondary | ICD-10-CM | POA: Diagnosis not present

## 2024-07-15 ENCOUNTER — Other Ambulatory Visit (HOSPITAL_BASED_OUTPATIENT_CLINIC_OR_DEPARTMENT_OTHER): Payer: Self-pay

## 2024-07-15 MED ORDER — FLUZONE 0.5 ML IM SUSY
0.5000 mL | PREFILLED_SYRINGE | Freq: Once | INTRAMUSCULAR | 0 refills | Status: AC
  Filled 2024-07-15: qty 0.5, 1d supply, fill #0

## 2024-08-01 ENCOUNTER — Other Ambulatory Visit (HOSPITAL_COMMUNITY): Payer: Self-pay

## 2024-09-16 ENCOUNTER — Other Ambulatory Visit (HOSPITAL_COMMUNITY): Payer: Self-pay

## 2024-10-07 ENCOUNTER — Ambulatory Visit: Payer: Self-pay | Admitting: Internal Medicine

## 2024-10-07 ENCOUNTER — Ambulatory Visit (HOSPITAL_BASED_OUTPATIENT_CLINIC_OR_DEPARTMENT_OTHER)
Admission: RE | Admit: 2024-10-07 | Discharge: 2024-10-07 | Disposition: A | Discharge status: Home / Self Care | Source: Ambulatory Visit | Attending: Internal Medicine | Admitting: Internal Medicine

## 2024-10-07 DIAGNOSIS — Z780 Asymptomatic menopausal state: Secondary | ICD-10-CM | POA: Diagnosis present

## 2025-01-03 ENCOUNTER — Ambulatory Visit: Admitting: Adult Health

## 2025-01-20 ENCOUNTER — Ambulatory Visit: Admitting: Hematology and Oncology
